# Patient Record
Sex: Male | Born: 1950
Health system: Southern US, Community
[De-identification: ages and names within clinical notes are randomized; demographics above are authoritative.]

## PROBLEM LIST (undated history)

## (undated) DIAGNOSIS — E785 Hyperlipidemia, unspecified: Secondary | ICD-10-CM

## (undated) DIAGNOSIS — I639 Cerebral infarction, unspecified: Secondary | ICD-10-CM

## (undated) DIAGNOSIS — Z789 Other specified health status: Secondary | ICD-10-CM

## (undated) DIAGNOSIS — Z720 Tobacco use: Secondary | ICD-10-CM

## (undated) DIAGNOSIS — I2510 Atherosclerotic heart disease of native coronary artery without angina pectoris: Secondary | ICD-10-CM

## (undated) DIAGNOSIS — I679 Cerebrovascular disease, unspecified: Secondary | ICD-10-CM

## (undated) DIAGNOSIS — R0600 Dyspnea, unspecified: Secondary | ICD-10-CM

## (undated) DIAGNOSIS — I340 Nonrheumatic mitral (valve) insufficiency: Secondary | ICD-10-CM

## (undated) DIAGNOSIS — M1990 Unspecified osteoarthritis, unspecified site: Secondary | ICD-10-CM

## (undated) DIAGNOSIS — I10 Essential (primary) hypertension: Secondary | ICD-10-CM

## (undated) DIAGNOSIS — J449 Chronic obstructive pulmonary disease, unspecified: Secondary | ICD-10-CM

## (undated) DIAGNOSIS — K219 Gastro-esophageal reflux disease without esophagitis: Secondary | ICD-10-CM

## (undated) DIAGNOSIS — C61 Malignant neoplasm of prostate: Secondary | ICD-10-CM

## (undated) DIAGNOSIS — R55 Syncope and collapse: Secondary | ICD-10-CM

## (undated) DIAGNOSIS — C801 Malignant (primary) neoplasm, unspecified: Secondary | ICD-10-CM

## (undated) DIAGNOSIS — J45909 Unspecified asthma, uncomplicated: Secondary | ICD-10-CM

## (undated) HISTORY — DX: Cerebrovascular disease, unspecified: I67.9

## (undated) HISTORY — DX: Syncope and collapse: R55

## (undated) HISTORY — DX: Other specified health status: Z78.9

## (undated) HISTORY — DX: Nonrheumatic mitral (valve) insufficiency: I34.0

## (undated) HISTORY — DX: Malignant neoplasm of prostate: C61

## (undated) HISTORY — DX: Atherosclerotic heart disease of native coronary artery without angina pectoris: I25.10

---

## 1998-08-12 ENCOUNTER — Ambulatory Visit (HOSPITAL_COMMUNITY): Admission: RE | Admit: 1998-08-12 | Discharge: 1998-08-12 | Payer: Self-pay | Admitting: Neurosurgery

## 1998-08-12 ENCOUNTER — Encounter: Payer: Self-pay | Admitting: Neurosurgery

## 1998-08-19 ENCOUNTER — Encounter: Payer: Self-pay | Admitting: Emergency Medicine

## 1998-08-20 ENCOUNTER — Inpatient Hospital Stay (HOSPITAL_COMMUNITY): Admission: EM | Admit: 1998-08-20 | Discharge: 1998-08-22 | Payer: Self-pay | Admitting: Emergency Medicine

## 1998-08-26 ENCOUNTER — Encounter: Payer: Self-pay | Admitting: Emergency Medicine

## 1998-08-27 ENCOUNTER — Inpatient Hospital Stay (HOSPITAL_COMMUNITY): Admission: EM | Admit: 1998-08-27 | Discharge: 1998-08-30 | Payer: Self-pay | Admitting: Emergency Medicine

## 1998-08-28 ENCOUNTER — Encounter: Payer: Self-pay | Admitting: Emergency Medicine

## 1998-09-29 ENCOUNTER — Ambulatory Visit (HOSPITAL_COMMUNITY): Admission: RE | Admit: 1998-09-29 | Discharge: 1998-09-29 | Payer: Self-pay | Admitting: Neurosurgery

## 1998-10-03 ENCOUNTER — Encounter: Payer: Self-pay | Admitting: Neurosurgery

## 1998-10-03 ENCOUNTER — Ambulatory Visit (HOSPITAL_COMMUNITY): Admission: RE | Admit: 1998-10-03 | Discharge: 1998-10-03 | Payer: Self-pay | Admitting: Neurosurgery

## 2000-05-20 ENCOUNTER — Encounter: Payer: Self-pay | Admitting: Emergency Medicine

## 2000-05-21 ENCOUNTER — Inpatient Hospital Stay (HOSPITAL_COMMUNITY): Admission: EM | Admit: 2000-05-21 | Discharge: 2000-05-23 | Payer: Self-pay | Admitting: Emergency Medicine

## 2000-05-22 ENCOUNTER — Encounter: Payer: Self-pay | Admitting: Internal Medicine

## 2000-08-13 ENCOUNTER — Inpatient Hospital Stay (HOSPITAL_COMMUNITY): Admission: EM | Admit: 2000-08-13 | Discharge: 2000-08-15 | Payer: Self-pay | Admitting: Cardiology

## 2002-04-07 ENCOUNTER — Emergency Department (HOSPITAL_COMMUNITY): Admission: EM | Admit: 2002-04-07 | Discharge: 2002-04-07 | Payer: Self-pay | Admitting: Emergency Medicine

## 2002-04-07 ENCOUNTER — Encounter: Payer: Self-pay | Admitting: Emergency Medicine

## 2003-10-05 ENCOUNTER — Emergency Department (HOSPITAL_COMMUNITY): Admission: EM | Admit: 2003-10-05 | Discharge: 2003-10-05 | Payer: Self-pay | Admitting: *Deleted

## 2003-10-19 ENCOUNTER — Ambulatory Visit (HOSPITAL_COMMUNITY): Admission: RE | Admit: 2003-10-19 | Discharge: 2003-10-19 | Payer: Self-pay | Admitting: Pulmonary Disease

## 2004-04-27 ENCOUNTER — Other Ambulatory Visit: Payer: Self-pay

## 2004-04-28 ENCOUNTER — Other Ambulatory Visit: Payer: Self-pay

## 2004-05-18 ENCOUNTER — Ambulatory Visit: Payer: Self-pay | Admitting: Internal Medicine

## 2004-06-26 ENCOUNTER — Emergency Department: Payer: Self-pay | Admitting: Emergency Medicine

## 2004-06-28 ENCOUNTER — Ambulatory Visit: Payer: Self-pay | Admitting: Orthopedic Surgery

## 2004-10-12 ENCOUNTER — Emergency Department: Payer: Self-pay | Admitting: Emergency Medicine

## 2004-10-14 ENCOUNTER — Emergency Department: Payer: Self-pay | Admitting: Emergency Medicine

## 2004-12-13 ENCOUNTER — Ambulatory Visit: Payer: Self-pay | Admitting: Psychiatry

## 2005-05-01 ENCOUNTER — Observation Stay: Payer: Self-pay

## 2005-05-01 ENCOUNTER — Other Ambulatory Visit: Payer: Self-pay

## 2005-05-03 ENCOUNTER — Inpatient Hospital Stay: Payer: Self-pay | Admitting: Internal Medicine

## 2005-10-09 ENCOUNTER — Ambulatory Visit: Payer: Self-pay | Admitting: Physician Assistant

## 2005-11-27 ENCOUNTER — Other Ambulatory Visit: Payer: Self-pay

## 2005-12-04 ENCOUNTER — Ambulatory Visit: Payer: Self-pay | Admitting: Unknown Physician Specialty

## 2006-01-02 ENCOUNTER — Other Ambulatory Visit: Payer: Self-pay

## 2006-01-02 ENCOUNTER — Observation Stay: Payer: Self-pay | Admitting: Internal Medicine

## 2006-08-06 HISTORY — PX: CORONARY ARTERY BYPASS GRAFT: SHX141

## 2006-10-15 ENCOUNTER — Ambulatory Visit: Payer: Self-pay | Admitting: Cardiology

## 2006-10-15 ENCOUNTER — Ambulatory Visit: Admission: RE | Admit: 2006-10-15 | Discharge: 2006-10-16 | Payer: Self-pay | Admitting: Orthopedic Surgery

## 2006-10-16 ENCOUNTER — Inpatient Hospital Stay (HOSPITAL_COMMUNITY): Admission: EM | Admit: 2006-10-16 | Discharge: 2006-10-17 | Payer: Self-pay | Admitting: Emergency Medicine

## 2006-10-28 ENCOUNTER — Ambulatory Visit: Payer: Self-pay | Admitting: Cardiology

## 2006-11-11 ENCOUNTER — Ambulatory Visit: Payer: Self-pay | Admitting: Physician Assistant

## 2006-11-25 ENCOUNTER — Ambulatory Visit: Payer: Self-pay | Admitting: Physician Assistant

## 2007-02-11 ENCOUNTER — Encounter (INDEPENDENT_AMBULATORY_CARE_PROVIDER_SITE_OTHER): Payer: Self-pay | Admitting: Orthopedic Surgery

## 2007-02-11 ENCOUNTER — Inpatient Hospital Stay (HOSPITAL_COMMUNITY): Admission: RE | Admit: 2007-02-11 | Discharge: 2007-02-15 | Payer: Self-pay | Admitting: Orthopedic Surgery

## 2007-08-07 HISTORY — PX: BACK SURGERY: SHX140

## 2008-03-26 ENCOUNTER — Encounter: Admission: RE | Admit: 2008-03-26 | Discharge: 2008-03-26 | Payer: Self-pay | Admitting: Orthopedic Surgery

## 2008-04-15 ENCOUNTER — Inpatient Hospital Stay (HOSPITAL_COMMUNITY): Admission: EM | Admit: 2008-04-15 | Discharge: 2008-04-30 | Payer: Self-pay | Admitting: Cardiology

## 2008-04-15 ENCOUNTER — Ambulatory Visit: Payer: Self-pay | Admitting: Cardiology

## 2008-04-22 ENCOUNTER — Ambulatory Visit: Payer: Self-pay | Admitting: Internal Medicine

## 2008-04-26 ENCOUNTER — Ambulatory Visit: Payer: Self-pay | Admitting: Physical Medicine & Rehabilitation

## 2008-04-27 ENCOUNTER — Encounter: Payer: Self-pay | Admitting: Internal Medicine

## 2010-08-26 ENCOUNTER — Encounter: Payer: Self-pay | Admitting: Internal Medicine

## 2010-12-19 NOTE — H&P (Signed)
NAME:  [name], [name]                    ACCOUNT NO.:  [id number]   MEDICAL RECORD NO.:  [id number]          PATIENT TYPE:  INP   LOCATION:  NA                           FACILITY:  [facility]   PHYSICIAN:  Lianne Cure, P.A.  DATE OF BIRTH:  06-06-1951   DATE OF ADMISSION:  DATE OF DISCHARGE:                              HISTORY & PHYSICAL   PREOPERATIVE DIAGNOSIS:  Postlaminectomy status post L5-S1 disk  herniation.   CHIEF COMPLAINT:  Lumbar pain with radicular leg pain, more so on the  left than the right.  He does have anterior tibialis weakness of a -3/4,  restricted straight leg raise to 30 degrees bilaterally.  He has  difficulty sitting and standing, moves constantly in the examination  room.  He has an MRI from [facility] and it  reports mild central canal narrowing at L4-5 without evidence of nerve  root impingement and postoperative changes to L5-S1, status post  laminectomy and left foramen appears mildly to moderately narrowed with  right foramen opened.  Broad-based disk bulge implants spurring  eccentric to the left at L5-S1.   PAST MEDICAL HISTORY:  Includes hypertension, COPD and coronary artery  disease.   CURRENT MEDICATIONS:  Include Demerol and CPAP at night which he does  not use.   PAST SURGICAL HISTORY:  Includes heart stent placement and back surgery  in 2007 by Dr. Nelda Severe.   FAMILY HISTORY:  Hypertension, his sister.   VITALS:  Today his temperature is 97.2, pulse 68, respirations 18, blood  pressure 146/92.   REVIEW OF SYSTEMS:  He reports no fever.  No chills.  No night sweats.  No recent weight loss or weight gain.  He has no difficulty swallowing.  No chronic cough.  No nausea, vomiting, diarrhea.  No hemoptysis.  No  bowel or bladder incontinence.  No seizures.  No headaches.   PHYSICAL EXAMINATION:  In appearance, he is well developed, well  nourished.  Pupils are equal, round, reactive to light.  NECK:  Full  range of  motion.  No pain.  No adenopathy.  CHEST:  Clear to auscultation.  No wheezes noted today.  HEART:  Regular rate and rhythm.  No murmur.  ABDOMEN:  Positive bowel sounds, soft, nontender to palpation.  EXTREMITIES:  He has weakness anterior tibialis left lower extremity  3/4.  Pain radiating down the bilateral lower extremities and into the  lumbar spine.  He has tenderness to palpation L5-S1.   IMPRESSION:  Recurrent herniated nucleus pulposus L5-S1 status post  laminectomy.   PLAN:  Laminectomy revision with fusion L5-S1 posterior lumbar spine  with iliac crest bone graft by Dr. Nelda Severe.      Lianne Cure, P.A.     MC/MEDQ  D:  02/06/2007  T:  02/06/2007  Job:  [id number]

## 2010-12-19 NOTE — H&P (Signed)
NAMEPRICE, LACHAPELLE                    ACCOUNT NO.:  [id number]   MEDICAL RECORD NO.:  [id number]          PATIENT TYPE:  INP   LOCATION:  2001                         FACILITY:  [facility]   PHYSICIAN:  Vernice Jefferson, MD          DATE OF BIRTH:  Dec 14, 1950   DATE OF ADMISSION:  04/15/2008  DATE OF DISCHARGE:                              HISTORY & PHYSICAL   PRIVATE CARDIOLOGIST:  Learta Codding, MD, [facility].   REASON FOR TRANSFER:  Chest pain.   HISTORY OF PRESENT ILLNESS:  The patient is a 60 year old white male  with history of coronary artery disease.  His last intervention was in  2008 in March with a cutting balloon to a mid LAD, in-stent restenosis,  who presented to hospital in [place] with complaints of repeat chest  pain.  He reports his chest pain has been ongoing basically for the past  week.  He has been chest pain free up until this past week.  Unfortunately, this chest pain comes on with rest and with exertion, but  does get worse with exertional activity.  He reports that he does have  some radiation to his left jaw and left arm, and is similar to his prior  anginal pain.  While in the outside ED, he has had negative biomarkers.  His EKG was unremarkable, but transferred here for further evaluation,  possible left heart catheterization.  Due to his history of medication  noncompliance in the past, the decision was made to use cutting balloon  rather place the drug-eluting stent as definitive therapy last time.   PAST MEDICAL HISTORY:  1. Coronary artery disease as detailed above.  2. Hypertension.   SOCIAL HISTORY:  She is half a pack a day smoker still, no alcohol, no  drug use, lives in [place].   FAMILY HISTORY:  Reviewed, noncontributory to the patient's current  medical condition.   MEDICATIONS:  The patient does not take any.   ALLERGIES:  No known drugs allergies.   REVIEW OF SYSTEMS:  Negative ten-point review systems except for those  dictated in the above  HPI.   PHYSICAL EXAMINATION:  VITAL SIGNS:  Blood pressure is 142/76 on  arrival.  Heart rate of 59, respirations 12, afebrile.  GENERAL:  A well-developed, well-nourished white male, in no acute  distress.  HEENT:  Moist mucous membranes.  No scleral icterus, no conjunctival  pallor.  NECK:  Supple.  Full range of motion.  No jugular venous distention.  CARDIOVASCULAR:  Regular rate and rhythm.  No murmurs, rubs, or gallops.  CHEST:  Clear to auscultation bilaterally.  No wheezes, rales, or  rhonchi.  ABDOMEN:  Soft, nontender, nondistended, normoactive bowel sounds.  EXTREMITIES:  No peripheral edema.  Pulses are 2+ bilaterally.  NEURO:  Nonfocal.   EKG demonstrates normal sinus rhythm, normal ECG.  Chest x-Luc, no acute  infiltrative process.   LABORATORY DATA:  Negative biomarkers.  BUN and creatinine is 16 and  1.1.  Hemoglobin is 60.3 with normal white count.   IMPRESSION:  1. Acute  coronary syndrome, unstable angina  2. Hypertension   PLAN:  I think  it is more likely to be related to in-stent restenosis.  However, I cannot rule out plaque rupture, therefore, we will initiate  heparin, aspirin therapy, keep him n.p.o. for possible invasive risk  stratification in the morning, hold beta-blocker given his relative  bradycardia and initiate statin therapy for now.      Vernice Jefferson, MD  Electronically Signed     JT/MEDQ  D:  04/15/2008  T:  04/15/2008  Job:  [id number]

## 2010-12-19 NOTE — Cardiovascular Report (Signed)
NAME:  [name], [name]                    ACCOUNT NO.:  [id number]   MEDICAL RECORD NO.:  [id number]          PATIENT TYPE:  INP   LOCATION:  6529                         FACILITY:  [facility]   PHYSICIAN:  Bruce R. Juanda Chance, MD, FACCDATE OF BIRTH:  [birth year]   DATE OF PROCEDURE:  04/15/2008  DATE OF DISCHARGE:                            CARDIAC CATHETERIZATION   CLINICAL HISTORY:  Mr. Pirro is a 60 year old and [place]  He has had previous near stent placed in the proximal LAD and  in March 2008, he had cutting balloon angioplasty for in-stent  restenosis.  This was done prior to back surgery.  He has done well  until recently when he developed exertional chest pain over the last  week and this increased and he was admitted to the hospital in [place]  and transferred to [place] for further evaluation.   PROCEDURE:  The procedure was performed by the right femoral artery,  arterial sheath, and 5-French preformed coronary catheters.  A front  wall arterial puncture was performed, and Omnipaque contrast was used.  At completion of the diagnostic study, we made a decision to proceed  with intervention on the lesions in the LAD, which was an in-stent  restenotic lesion in the diagonal branch of the LAD.   The patient was given Angiomax bolus and infusion.  He was given 600 mg  of Plavix.  He had been given 4 chewable aspirin earlier.  We used a Q4  6-French guiding catheter with side holes.  We passed a Prowater wire  down the LAD and across the lesion in the diagonal branch without  difficulty.  We used a 2.0 x 6 mm cutting balloon, performed 3  inflations up 10 atmospheres for 30 seconds.  The balloon was then  removed.  We then approached the lesion in the proximal LAD, which was  within the previously placed stent.  We used a 3.0 x 15 mm cutting  balloon and performed 3 inflations up to 10 atmospheres for 30 seconds.  Final diagnostics were then performed through the guiding  catheter.  The  patient tolerated the procedure well and left the laboratory in  satisfactory condition.   RESULTS:  Left main coronary artery.  The left main coronary artery was  free of significant disease.   Left anterior descending artery.  Please note that the left anterior  descending artery gave rise to a diagonal branch and a septal  perforator.  There was 70% stenosis in the proximal portion of the stent  in the proximal LAD.  There was 90% stenosis in the proximal portion of  the diagonal branch.  This stenosis appears somewhat worsen on the  previous study.   Circumflex artery.  The underlying circumflex artery gave rise to an  atrial branch, a small marginal branch, and a large marginal branch and  a posterolateral branch.  There was 70% stenosis in the large marginal  branch.   Right coronary artery.  Please note that the right coronary artery was a  moderate-sized vessel that gave rise  to a small right ventricle branch,  a large ventricle branch, posterior descending branch, and a small  posterolateral branch.  There were tandem 40% and 30% stenoses in the  proximal right coronary artery.  There was 70% stenosis in the second  marginal branch.   Left ventriculogram.  Please note that the left ventriculogram performed  in the RAO projection showed good wall motion with no areas of  hypokinesis.  Estimated ejection fraction was 60%.   Following cutting balloon angioplasty lesion in diagonal branch of the  LAD, stenosis improved from 90% to 30%.   Following cutting balloon angioplasty of the in-stent restenotic lesion  in the proximal LAD, stenosis improved from 90% to less than 10%.   CONCLUSION:  1. Coronary artery status post prior coronary interventions as      described above with 70% in-stent restenosis in the proximal left      anterior descending, 90% stenosis within the diagonal branch of the      left anterior descending, 70% stenosis in the marginal  branch of      circumflex artery, and 40% proximal stenosis in the right coronary      artery with 70% stenosis in the acute marginal branch and normal      left ventricular function.  2. Successful cutting balloon angioplasty of the lesion in the      diagonal branch of the left anterior descending with improvement in      the percent diameter narrowing from 90% to 30%.  3. Successful cutting balloon angioplasty for the in-stent restenotic      lesion in the proximal left anterior descending with improvement in      the percent diameter narrowing from 70% to less than 10%.   DISPOSITION:  The patient then presents for further observation.  The  patient has not been compliant with therapy.  I think since we did not  put in stents, we could get by with just  aspirin at discharge if  needed.      Bruce Elvera Lennox Juanda Chance, MD, [facility]  Electronically Signed     BRB/MEDQ  D:  04/15/2008  T:  04/16/2008  Job:  [id number]   cc:   Learta Codding, MD,FACC

## 2010-12-19 NOTE — Op Note (Signed)
NAMEOBRIAN, [name]                    ACCOUNT NO.:  [id number]   MEDICAL RECORD NO.:  [id number]          PATIENT TYPE:  INP   LOCATION:  5014                         FACILITY:  [facility]   PHYSICIAN:  Nelda Severe, MD      DATE OF BIRTH:  [birth year]   DATE OF PROCEDURE:  02/11/2007  DATE OF DISCHARGE:                               OPERATIVE REPORT   SURGEON:  Nelda Severe, MD.   ASSISTANT:  Lianne Cure, PA-C.   PREOPERATIVE DIAGNOSIS:  Status post left L5-S1 disk excision with  lumbar spondylosis at L5-S1.   POSTOPERATIVE DIAGNOSIS:  Status post left L5-S1 disk excision with  lumbar spondylosis at L5-S1.   PROCEDURE:  Re-exploration laminectomy and foraminotomy left L5-S1 for  foraminal stenosis; posterolateral fusion at L5-S1 bilaterally;  transforaminal lumbar interbody fusion with cage at L5-S1; insertion  pedicle screws L5-S1 bilaterally; harvest right posterior iliac crest  graft.   OPERATIVE NOTE:  The patient was placed under general endotracheal  anesthesia.  A Foley catheter was placed in the bladder.  Intravenous  antibiotics had been administered for prophylaxis inspection.  Sponge  compression devices were placed on both lower extremities.  The patient  was positioned prone on the [place] frame.  Care was taken to position  the upper extremities so as to avoid hyperflexion and abduction of the  shoulders and so as to avoid hyperflexion of the elbows.  The upper  extremities were padded with foam from axillae to hands.  The thighs,  knees, shins and ankles were supported on pillows.  Hair was clipped  from the back.  A midline incision was marked with a skin marker  including the area of the previous short midline incision and a mark  placed at the proposed site of incision for right posterior iliac crest  graft harvest.   The skin was prepped with DuraPrep and the field draped in a rectangular  fashion, the drape secured with Ioban.  A midline incision was made  including an elliptical excision of the scar from the previous surgery.  Subcutaneous tissue and paraspinal muscles were injected with a mixture  of 0.25% Marcaine plain and 1% lidocaine with epinephrine.  Dissection  was then carried down onto the tips of the spinous processes using  cutting current and the paraspinal muscles mobilized bilaterally from  the L4-5 facette joint distally to the L5-S1 facette joints.  The  laminectomy membrane was identified at the previous L5-S1 laminotomy.  A  cross-table lateral radiograph was taken to confirm the level.   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  This  was done in the usual fashion, identifying the base of the superior  tracheal process, removing a piece of bone, identifying the posterior  pedicle, perforating it with an awl, creating a hole through the  pedicle  into the vertebral body with a pedicle probe and/or a 3.5 mm drill bit,  probing it carefully to make sure it was circumferentially intact,  sounding it and recording the depths, injecting FloSeal into the hole  and then placing a radiopaque marker.   Next we harvested bone graft from the right posterior iliac crest.  A  short incision was made just lateral to the crest and the soft tissue  then retracted medially.  The fascial insertion of the gluteal fascia  and thoracolumbar fascia was then elevated off the posterior surface of  the crest proximal to the posterior superior iliac spine.  A disposable  bone harvesting trephine was then used to remove multiple cores of bone  and then further bone was harvested using a gouge and curette.  This  defect was then irrigated with saline and packed with Gelfoam.  The  fascia was reapposed using one  figure-of-eight #1 Vicryl suture.  I then  placed an 18-gauge spinal needle into the left posterior iliac crest and  aspirated a total of 15 mL of bone marrow from three different areas and  this was mixed with 15 mL of beta tricalcium phosphate as well as the  bone graft which had been harvested from the right-sided crest.   Next, we decorticated the transverse process at L5 and S1 on the right  side.  A satisfactory quantity of the above-mentioned mixture of graft  and beta tricalcium phosphate was then placed between the transverse  processes.  We then placed screws in the S1 and L5 pedicles.  The  lengths were based upon depth measurements and the 7.5 mm diameter screw  was used at S1 and 6.5 at L5.  These screws were then stimulated and the  levels of current to evoke distal EMG stimulation were in satisfactory  safe zone.   A 50-mm precontoured titanium rod was then provisionally attached to the  screws.   I then moved to the left side of the table directed where I prepared the  transverse processes at L5 and S1 by decorticating them.  The screws  were then placed in similar fashion to that described on the right.  The  screws were stimulated and the currents required to cause distal EMG  stimulation were in a safe zone as well.  A working rod approach was  then placed and provisionally coupled on the left side.   Actually prior to placing the screws, we completed a foraminotomy on the  left side at L5-S1 resecting most of the inferior and superior articular  processes and identifying the disks laterally.  This seemed to  decompress the L5 nerve root exiting above the disk quite well.  The  neuroforamen was palpated and was felt to be quite open.   We then placed the working rod as noted above.  We then mobilized the  origins of the left S1 nerve root and retracted it towards the midline.  This allowed [place] to expose some lateral disk, and fenestrate it. A small  disk scraper  was inserted and turned such that it distracted the disk  space.  We then provisionally tightened the working rod to hold the  distraction.  Further curettage of the disk space was carried out and  then the disk space distracted further with an intradiskal distractor.  We further curetted the endplates and enucleated more disk.   I then placed a special funnel in the disk space for insertion of bone  graft and a moderately large quantity of the above-mentioned mixture of  graft and beta tricalcium phosphate was impacted into the front of the  disk space.  Ultimately we chose a 10 mm thick TLIF implant which was  packed with graft and impacted into place without difficulty.   During the entire procedure, once the S1 nerve root was retracted  medially, there was some spontaneous firing noted by the  neurophysiologic technician.  This did not stop when the retraction was  ceased nor was it aggravated particularly by impaction of the implant.   Once the implant had been countersunk, the working rod was removed and a  new precontoured 50-mm titanium rod attached.  All couplings on both  sides were torqued.  A cross-table lateral radiograph showed  satisfactory position of screws.   We injected FloSeal into the disk space, posterior to the TLIF implant.  Prior to placing the rod between the screws on the left side, we did  pack bone graft posterolaterally between transverse processes of L5 and  S1.   There was some bone graft left over.  I further resected a facette joint  on the right side at L5-S1 and packed graft into that area as well.   We then placed a 15-gauge Blake drain subfascially.  This was secured to  the skin using a 2-0 nylon suture in basket weave fashion.  The  thoracolumbar fascia was then closed using continuous #1 Vicryl suture.  A 1/8-inch Hemovac drain was placed in the subcutaneous layer of the  central lumbar wound and brought out through the bone graft harvest  site  wound to the right side and through the skin laterally.  It was secured  with a 2-0 nylon suture as well. A subcuticular closure was then carried  out in both wounds using interrupted 2-0 Vicryl suture.  The skin was  closed using subcuticular 3-0 undyed Vicryl in continuous fashion.  The  skin edges were reinforced with Steri-Strips.  Dressings were antibiotic  ointment with gauze and secured by OpSite.  There was insufficient blood  to spin any Cell Saver blood.  Blood loss estimated at about 250 mL.  There were no intraoperative complications.  Sponge and needle counts  were correct.   The patient returned to the recovery room in satisfactory condition.  He  could move both extremities there, although he seemed to have a  difficult time concentrating enough to really give [place] resisted  contraction in any particular muscle group.      Nelda Severe, MD  Electronically Signed     MT/MEDQ  D:  02/11/2007  T:  02/12/2007  Job:  [id number]

## 2010-12-19 NOTE — H&P (Signed)
[name], [name]                    ACCOUNT NO.:  [id number]   MEDICAL RECORD NO.:  [id number]          PATIENT TYPE:  INP   LOCATION:  4714                         FACILITY:  [facility]   PHYSICIAN:  Rufina Falco, M.D.     DATE OF BIRTH:  [birth year]   DATE OF ADMISSION:  04/15/2008  DATE OF DISCHARGE:                              HISTORY & PHYSICAL   ATTENDING PHYSICIAN:  Casimiro Needle L. Thad Ranger, MD   CHIEF COMPLAINT:  Chest pain.   HISTORY OF PRESENT ILLNESS:  The patient is a 60 year old male with past  medical history significant for CAD status post cath x3 and subsequent  angioplasty x2 to the LAD including a diagonal branch, hypertension,  hyperlipidemia, alcohol abuse, tobacco abuse, and medication  noncompliance, being evaluated by Neurology for confusion and dizziness.  The patient reports he is dizzy when he stands up, when he looks up, or  when he turns his head.  He denies prior similar episodes.  He also  mentions having a right-sided (temporal/parietal) headache for about a  week.  The patient also has some confusion per his daughter's and has  some slowing of his thought process.  The patient denies current chest  pain, cough, shortness of breath, dysuria, frequency, current abdominal  pain, melena, hematochezia, and hematuria, but is endorsed persistent  nausea with dizziness.  The patient is not orthostatic.  He denies  sensation or motor changes.  No other complaints.   PAST MEDICAL HISTORY:  1. CAD, status post cath x3 with ballooning of LAD and diagonal      branch.  2. Hyperlipidemia.  3. Hypertension.  4. Tobacco abuse.  5. Alcohol abuse.  6. GERD.  7. COPD.   PAST SURGICAL HISTORY:  1. Appendectomy.  2. Laminectomy, status post L5-S1 disk herniation and fusion.   FAMILY HISTORY:  Mom has hypertension.  Dad, he does not know.  Daughter  has hypertension.  There is no family history of seizures, stroke, or  neurological malignancy.   SOCIAL HISTORY:  Tobacco,  per the patient, he quit about 6 months ago,  but he has a 50-pack-year history.  Alcohol, he has a history of heavy  alcohol uses, now about a 6-pack per week with lasting drink being 2  weeks ago, and denies illicit drugs, is married.   HOME MEDICATIONS:  None secondary to noncompliance.   HOSPITAL MEDICATIONS:  1. Aspirin 325 mg daily.  2. Lactulose.  3. Tylenol.  4. Zofran.  5. Xanax.  6. Maalox.  7. Nitro p.r.n.  8. Ambien a.c.  Of note, the patient has not had any Ambien or Xanax.   ALLERGIES:  No known drug allergies.   PHYSICAL EXAM:  VITAL SIGNS:  Temperature equals 97.8, pulse equals 68,  respiratory rate equals 20, blood pressure equals 141/77, and O2 sats  equals 98% on room air.  GENERAL:  NAD.  HEENT:  AT, [place], EOMI, positive horizontal nystagmus in both eyes,  PERRLA, MMM.  NECK:  Supple, no carotid bruits, no JVD.  CV:  S1, S2, regular rate and rhythm, no  murmurs, rubs, or gallops.  LUNGS:  CTA bilaterally.  ABDOMEN:  Soft, left upper quadrant and left lower quadrant tenderness  to palpation.  NEUROLOGIC:  Alert and oriented x4, cranial nerves II through XII  grossly intact, motor 5/5 in the upper and lower extremities (question  mild left lower extremity proximal weakness), sensation intact to light  touch, cerebellar abnormal finger-to-nose okay, the patient with  difficulty walking secondary to dizziness, the patient unable to  maintain balance with eyes closed.  Fine motor and bilateral hands are  also slowed.  DTRs normal.  EXTREMITIES:  No edema, no cyanosis, clubbing in the hands.  SKIN:  No jaundice.  GU:  No CVA tenderness.   LABS:  Cholesterol 177, triglyceride 63, HDL equals 32, LDL equals 782,  cardiac enzymes negative x3, magnesium equals 2.4, PT equals 13.7, INR  equals 1.0, hemoglobin A1c equals 5.3, lipase equals 22, amylase equals  68, LFTs normal except albumin which is decreased in total protein,  mildly decreased at 5.8.  WBC equals 8.5,  hemoglobin equals 15.0,  platelets 133.  Sodium 139, potassium 4.8, chloride 109, bicarb 25, BUN  14, creatinine 0.88, glucose 141, anion gap of 5, calcium equals 8.9.  CT of the head with and without contrast no significant intracranial  abnormality.  CT of the abdomen and pelvis were negative for acute  pathology.  Abdominal ultrasound was also negative.   ASSESSMENT AND PLAN:  Confusion/dizziness.  Etiology unclear, but  differential diagnosis includes cerebrovascular accident,  vertebrobasilar insufficiency, Wernicke-Korsakoff syndrome, a cold  infection, vasculitis, etc.  The patient has had laminectomy and has  hardware which make sure it is MR family, but I assume it is.  The  patient does need MRI, MRA of the head and neck if possible.  Would get  this MRA of the neck with contrast.  Would also check a UA, urine C and  S given the patient's abdominal pain and flank pain during this  hospitalization, although the patient not complaining of dysuria at this  time.  We would also start thiamine and folic acid.  We will check TSH,  B12, RPR, and consent the patient for human immunodeficiency virus if  not already done.  We would also check sed rate.  The patient with  significant cerebellar dysfunction secondary to dizziness.  Question if  this is  from alcohol history or other etiologies such as cerebrovascular  accident.  We would discuss this interesting case with Dr. Thad Ranger.  We  will also consider full stroke workup depending on findings of the MRI,  MRA.  I appreciate this interesting consult.  We would also get PT, OT  consult if not already done so.      Rufina Falco, M.D.  Electronically Signed     JY/MEDQ  D:  04/20/2008  T:  04/21/2008  Job:  [id number]

## 2010-12-19 NOTE — Discharge Summary (Signed)
NAMELEODAN, BOLYARD                    ACCOUNT NO.:  [id number]   MEDICAL RECORD NO.:  [id number]          PATIENT TYPE:  INP   LOCATION:  4714                         FACILITY:  [facility]   PHYSICIAN:  Verne Carrow, MDDATE OF BIRTH:  Jan 25, 1951   DATE OF ADMISSION:  04/15/2008  DATE OF DISCHARGE:  04/30/2008                               DISCHARGE SUMMARY   PRIMARY CARDIOLOGIST:  Jonelle Sidle, MD   PRIMARY CARE Avonda Toso:  Dr.Tate in [place], [place].   DISCHARGE DIAGNOSIS:  Unstable angina.   SECONDARY DIAGNOSES:  1. Coronary artery disease, status post prior stenting of the left      anterior descending with percutaneous transluminal coronary      angioplasty of the proximal left anterior descending secondary to      in-stent restenosis on this admission.  2. Hypertension.  3. Hyperlipidemia.  4. Ongoing tobacco abuse.  5. EtOH abuse.  6. Ongoing dizziness with unclear etiology.  7. Gastroesophageal reflux disease.  8. Chronic obstructive pulmonary disease.  9. Diverticulosis by colonoscopy this admission.  10.Gastritis and duodenitis by esophagogastroduodenoscopy this      admission.   ALLERGIES:  No known drug allergies.   PROCEDURES:  1. Left heart cardiac catheterization with successful percutaneous      transluminal coronary angioplasty of the proximal left anterior      descending secondary to in-stent restenosis.  2. Esophagogastroduodenoscopy performed on April 22, 2008, showing      distal esophageal stricture dilated to 18 mm, 3-cm hiatal hernia,      gastritis, and duodenitis.  3. Colonoscopy performed on April 27, 2008, showing diverticulosis      that was felt to be moderate.  There were no polyps.   RADIOLOGY:  1. CT of the abdomen and pelvis showing no acute findings.  Head CT      showing no significant intracranial abnormality.  2. MRI/MRA of the brain showing no acute infarct with a question of      2.7-mm anterior communicating artery  aneurysm.  3. MRA of the neck showed atherosclerotic type changes postpartum and      the right carotid bifurcation without evidence of hemodynamically      significant stenoses.  4. Repeat MRI of the brain on April 26, 2008, showed no change      from prior study and no acute abnormality, lumbar puncture with      normal results.   HISTORY OF PRESENT ILLNESS:  A 60 year old male with prior history of  CAD, status post stenting of the LAD, who was in his usual state of  health until approximately 1 week prior to admission when he began to  experience rest and exertional substernal chest discomfort with  radiation to the jaw and left arm similar to previous angina.  He was  admitted to [facility] on April 15, 2008, for further evaluation.   HOSPITAL COURSE:  The patient underwent left heart cardiac  catheterization on April 15, 2008, revealing 70% in-stent restenosis  within the previously placed proximal LAD stent.  This was successfully  balloon  angioplastied.  The patient also had a 90% stenosis in the first  diagonal, which was also successfully balloon angioplastied with result  of approximately 30%.  EF was 60%, and the patient otherwise had  nonobstructive disease.  Postprocedure, he did complain of some right  upper quadrant and mild lower sternal discomfort.  ECG remained normal,  and enzymes remained negative.  A right upper quadrant ultrasound was  performed and was normal.  CT of the abdomen and pelvis was also  performed without any acute findings.  The patient reported dizziness  and for that reason remained in the hospital.  A CT of his head was  performed on April 20, 2008, and this showed no acute intracranial  abnormalities.  He had remained very dizzy with shifting of his eyes,  turning of his head, and changing position, and for that reason,  Neurology was consulted.  An MRI/MRA of brain was performed with results  as above, but in general had no  significant findings.  Neurology  questioned whether or not he could have vestibulitis  versus  cerebellitis versus Wernicke's.  The patient was maintained on DT  prophylaxis and thiamine and folic acid.  He was eventually placed on  meclizine therapy as well as Valium; however, this has made little  impact in his dizziness.  He is extensively evaluated by Physical  Therapy, Occupational Therapy, and Speech Therapy.  They have  recommended skilled nursing facility versus home with outpatient PT, OT,  and Speech.  He has been seen by our inpatient rehab team and was felt  not to be a candidate for inpatient rehab.  The patient wishes to avoid  skilled nursing facility placement.  For these reasons, he will be  discharged today with arrangements for outpatient PT, OT, and Speech at  [facility] in [place], [place].   With regards to the abdominal discomfort that Mr. Danielski experienced  during this admission, as noted above, he had a normal gallbladder  ultrasound as well as abdominal and pelvic CT.  [facility]  was consulted, and the patient underwent an EGD on April 22, 2008,  showing distal esophageal stricture that was subsequently dilated.  He  also had gastritis and duodenitis, and it was recommended that he would  be maintained on proton pump inhibitor therapy.  Colonoscopy was also  performed showing moderate diverticulosis.  Gastroenterology have  arranged for outpatient followup on June 01, 2008.  As the patient  lives in the [place], [place] area, he will likely obtain a GI followup  there.   DISCHARGE LABORATORIES:  Hemoglobin 14.0, hematocrit 40.7, WBC 8.2, and  platelets 221.  Sodium 138, potassium 4.1, chloride 106, CO2 of 28, BUN  10, creatinine 1.07, glucose 105, and calcium 8.9.  Total bilirubin 0.7,  alkaline phosphatase 81, AST 70, ALT 15, total protein 5.8, albumin 3.1,  magnesium 2.4, ammonia 13, amylase 68, hemoglobin A1c 5.3, and  lipase  24.  Cardiac markers are negative x4.  Total cholesterol 177,  triglycerides 63, HDL 32, and LDL 132.  TSH 3.124.  Vitamin B12 of 388.  Methylmalonic acid 221.  Lumbar puncture was normal.  RPR was negative.  H. pylori screen was negative.   DISPOSITION:  The patient will be discharged home today in good  condition.   FOLLOWUP PLANS AND APPOINTMENTS:  We have arranged for followup with Dr.  Nona Dell in our [place] office on May 20, 2008, at 10 a.m.  He  will follow up  with Dr. Stan Head at [facility] in [place] on  June 01, 2008, at 10:30 a.m.  We have asked to follow with Dr. Delories Heinz  up in [place].  He will begin speech, physical, occupational therapy at  [facility] at 12:45 p.m.   DISCHARGE MEDICATIONS:  1. Aspirin 325 mg daily.  2. Nitroglycerin 0.4 mg sublingual p.r.n. chest pain.  3. Protonix 40 mg daily.  4. Thiamine 100 mg daily.  5. Folic acid 1 mg daily.  6. Meclizine 25 mg t.i.d.   We discontinued statin therapy secondary to the patient's abdominal  pain.  This could be re-initiated as an outpatient.  The patient did not  require Plavix in the setting of PTCA only.  We discontinued beta-  blocker secondary to baseline bradycardia.   OUTSTANDING LABORATORY STUDIES:  None.   DURATION/DISCHARGE ENCOUNTER:  90 minutes including physician time.       Nicolasa Ducking, ANP      Verne Carrow, MD  Electronically Signed    CB/MEDQ  D:  04/30/2008  T:  05/01/2008  Job:  [id number]   cc:   Iva Boop, MD,FACG  Robyne Peers, MD

## 2010-12-19 NOTE — Procedures (Signed)
EEG NUMBER:  [id number]   REF FERRING PHYSICIAN:  Luis Abed, MD, [facility]   This 60 year old man is admitted for chest pain, had a lumbar puncture,  has dizziness, confusion, right-sided temporal parietal headache,  history of coronary artery disease, had a cath with ballooning of LAD,  and diagonal branch, has hyperlipidemia, hypertension, tobacco and  alcohol abuse, GERD, and COPD.  It is not clear what the indication for  the EEG was except perhaps the dizziness and confusion.   This is a portable 17-channel EEG with one channel devoted to EKG  utilizing [employer] 10/20 lead placement system.   MEDICATIONS:  The patient's medications listed include Zocor, vitamin  B1, folic acid, Colace, Protonix, MiraLax, Antivert, Valium, aspirin,  Versed, fentanyl, nitroglycerin, GI cocktail, Tylenol, Xanax, milk of  magnesia, Ambien, Zofran, morphine and Ativan.  He is described as awake  and drowsy, clinically although electrographically appeared to be awake  the whole time.  Hyperventilation was not performed.  Photic stimulation  was performed.  Background consists of well-organized, well-developed,  well-modulated 9-10 Hz alpha activity which is predominant at the  posterior head regions and reactive to eye-opening.  No interhemispheric  asymmetry is identified and no definite epileptiform discharges are  seen.  Photic stimulation did not produce any significant change in the  background activity and the EKG monitor reveals relatively regular  rhythm with a rate of 78 beats per minute.   CONCLUSION:  Normal EEG in the waking state without seizure activity or  focal abnormality seen during the course today's recording.  Clinical  correlation is recommended.      Catherine A. Orlin Hilding, M.D.  Electronically Signed     NUU:VOZD  D:  04/28/2008 19:16:48  T:  04/29/2008 03:52:32  Job #:  [id number]

## 2010-12-22 NOTE — H&P (Signed)
[name], [name]                    ACCOUNT NO.:  [id number]   MEDICAL RECORD NO.:  [id number]          PATIENT TYPE:  INP   LOCATION:  NA                           FACILITY:  [facility]   PHYSICIAN:  Nelda Severe, MD      DATE OF BIRTH:  Jul 18, 1951   DATE OF ADMISSION:  DATE OF DISCHARGE:                              HISTORY & PHYSICAL   DIAGNOSIS:  L5-S1 lumbar recurrent disc herniation with stenosis.   He has left leg pain to his foot, right leg pain to the knee and lumbar  pain.   HE HAS NO KNOWN DRUG ALLERGIES.   CURRENT MEDICATIONS:  Includes:  1. Mepergan fortis 1 tablet every 6 hours p.r.n. for pain.  2. Lyrcia 150 twice daily.   SOCIAL HISTORY:  He is a smoker.  He smokes approximately 6 cigarettes a  day, he is trying to quit.  He has a positive alcohol use and states it  is social.   FAMILY HISTORY:  Includes hypertension and arthritis.   REVIEW OF SYSTEMS:  He reports no changing of vision.  No changes in  hearing.  No hoarseness.  No chronic cough.  No shortness of breath.  No  fevers.  No chills.  No chest pain.  No bowel or bladder incontinence.  No blackouts.  No seizures.  No hemoptysis.   PHYSICAL EXAMINATION:  VITAL SIGNS:  Today, he has a temperature of  97.7.  Pulse rate of 72.  Respirations 18.  His blood pressure is  128/84.  HEENT:  He is normocephalic in appearance.  Pupils are equal, round and  reactive to light.  NECK:  Has full range of motion, nontender to palpation.  CHEST:  Clear to auscultation bilaterally.  No wheezing noted.  HEART:  Regular rate and rhythm.  No murmurs noted.  ABDOMEN:  Soft.  Positive bowel sounds auscultated.  He is nontender to  palpation.  EXTREMITY EXAM:  Left lower extremity, he has radicular pain from the  lumbar area, across the buttocks to his foot.  Right lower extremity has  radicular pain down the posterior thigh to his knee.  Distally,  sensation is grossly intact and equal bilaterally L4-S1.  Reflexes of  the  ankles and knees are intact and equal bilaterally.  He does have an  antalgic gait.   X-rays were reviewed.  Show recurrent herniated nucleus pulposa.  On  MRI, degenerative disc disease, status post disc excision.   Diagnosis L5-S1 recurrent disc protrusion   PLAN:  L5-S1 laminectomy fusion with iliac bone graft by Dr. Nelda Severe.      Lianne Cure, P.A.      Nelda Severe, MD  Electronically Signed    MC/MEDQ  D:  10/11/2006  T:  10/12/2006  Job:  [id number]

## 2010-12-22 NOTE — Assessment & Plan Note (Signed)
[facility]                          [facility] OFFICE NOTE   NAME:[name], [name]                           MRN:          [id number]  DATE:11/25/2006                            DOB:          Sep 08, 1950    PRIMARY CARDIOLOGIST:  Dr. Andee Lineman.   PRIMARY CARE PHYSICIAN:  He will be established with someone at [facility] December 04, 2006.   HISTORY OF PRESENT ILLNESS:  [name] is a 60 year old male patient with  a history of coronary disease status post bare metal stenting to the LAD  in 2000, who recently presented to the [facility] with unstable  angina pectoris and underwent cutting balloon angioplasty to the  proximal LAD. I have been following him closely over the last few weeks  to ensure that he is ready for surgery. He was last seen on November 11, 2006. He had multiple other complaints and we set him up with some lab  tests and also for follow up with his primary care physician. He did  have an elevated glucose at 141. This can be repeated with his new  primary care physician. He also had a mildly elevated alkaline  phosphatase at 109. This can also be followed with his new primary care  physician. Of note he did have a CBC that was normal. He comes back  today for follow up. He discontinued his Plavix a couple of days ago. He  denies any recurrent chest pain or shortness of breath. Denies any  exertional chest pressure or heaviness. Denies any syncope or near  syncope. He does however note some left lower quadrant pain. This  started a couple of days ago. He had a little bit of diarrhea yesterday.  Denies fevers or chills. He does feel somewhat bloated.   CURRENT MEDICATIONS:  1. Aspirin 325 mg daily.  2. Lipitor 80 mg at bedtime.  3. Toprol XL 50 mg daily.  4. Omeprazole 20 mg daily- he has not yet started this.   ALLERGIES:  No known drug allergies.   PHYSICAL EXAMINATION:  He is a well-nourished, well-developed male in no  distress, blood pressure is 155/92, pulse 70, weight 216.8 pounds.  HEENT: Unremarkable.  NECK: No JVD.  CARDIAC: S1, S2, regular rate and rhythm.  LUNGS: Clear to auscultation bilaterally without wheezing, rhonchi, or  rales.  ABDOMEN: Normoactive bowel sounds, no organomegaly. He is quite point  tender in the left lower quadrant to palpation with voluntary guarding.  EXTREMITIES: Without edema.  NEUROLOGIC: He is alert and oriented x3. Cranial nerves II-XII grossly  intact.   Electrocardiogram reveals sinus rhythm, heart rate is 76, normal axis.  No acute changes.   IMPRESSION:  1. Coronary artery disease.      a.     Status post bare metal stenting to the LAD in 2000.      b.     Status post cutting balloon angioplasty of the proximal LAD       secondary to unstable angina pectoris October 17, 2006.  2. Good left ventricular function with  and ejection fraction of 55% by      recent cardiac catheterization.  3. Hypertension.  4. Treated dyslipidemia.  5. Degenerative disc disease.      a.     Awaiting back surgery.  6. Chronic obstructive pulmonary disease.  7. Left lower quadrant abdominal pain.      a.     rule out diverticulitis  8. Sleep apnea.   PLAN:  The patient presents to the office today for follow up to receive  further recommendations regarding proceeding with surgery.  From a  cardiac perspective, he is not having any anginal symptoms. Overall he  is doing well. Dr. Juanda Chance at the time of catheterization noted that  surgery could be targeted for 6 weeks after intervention. He is off of  his Plavix now. I did suggest that we go up on his Toprol from 50 mg  to  100 mg a day and continue his beta blocker therapy throughout the  perioperative period. He will also need to remain on aspirin throughout  the perioperative period. He requires no further cardiac workup prior to  his non cardiac surgery and at this point in time should be at  acceptable risk.   He does  however have other medical issues ongoing. We will try to get  him in for sooner follow up with his primary care Marshae Azam who will  initially see him on April the 30th. I discussed the case further with  Dr. Andee Lineman today. We will set the patient up for an abdominal and pelvic  CT scan. We will use oral and IV contrast. We will rule out  retroperitoneal hematoma as the cause for his pain. We will also need to  rule out diverticulitis as a cause for his pain. As noted above we will  try to get him in to be seen sooner with his primary care Nyzir Dubois.  Of  note, if it is going to be somewhat difficult to get the patient in to  see his primary care Thai Burgueno sooner and he has evidence of  diverticulitis on his CT scan, we can always initiate Cipro 500 mg twice  a day for a week until he can be seen by his primary care Cass Vandermeulen. The  patient knows to go to the emergency room should he develop any  significant fevers or chills, hematochezia, nausea, vomiting, or  worsening diarrhea.   11/26/06 Addendum:  The CT returned showing uncomplicated diverticulitis.  The patient will be started on Cipro 500mg  BID for 7 days and we will  try to get him into his primary MD sooner for follow-up.      Tereso Newcomer, PA-C  Electronically Signed      Learta Codding, MD,FACC  Electronically Signed   SW/MedQ  DD: 11/25/2006  DT: 11/25/2006  Job #: [id number]   cc:   Nelda Severe, MD

## 2010-12-22 NOTE — Assessment & Plan Note (Signed)
[facility]                          [facility] NOTE   NAME:[name], [name]                           MRN:          [id number]  DATE:10/28/2006                            DOB:          1951-01-16    PRIMARY CARE PHYSICIAN:  None.   PRIMARY CARDIOLOGIST:  He is new to Dr. Lewayne Bunting.   HISTORY OF PRESENT ILLNESS:  Mr. Cuttino is a very pleasant 60 year old  male patient with a prior history of CAD status post bare metal stenting  to the proximal LAD in 2000, who was recently seen in [facility] for  possible back surgery.  He is from [place], about 70 miles away from  [place].  He was actually staying in the [place] area for his upcoming  surgery.  He developed symptoms that were consistent with unstable  angina pectoris.  He was brought to [facility].  He ruled out  for myocardial infarction but had cardiac catheterization that revealed  95% in-stent restenosis in the proximal LAD.  This was treated with  cutting balloon angioplasty.  Dr. Juanda Chance noted that he chose cutting  balloon, rather than a medicated stent, because of concerns of  compliance and because of probable need for early back surgery.  He  recommended Plavix for one month and probable target surgery some time  after 6 weeks.  He returns to the office today for followup.  He is  doing well without chest pain or shortness of breath.  He notes overall  he is doing well.  He continues to have back pain and is awaiting his  upcoming surgery.  He denies any problems with his right femoral artery  site.   CURRENT MEDICATIONS:  1. Aspirin 325 mg daily.  2. Plavix 75 mg daily.  3. Lipitor 80 mg q.h.s.  4. Toprol XL 25 mg daily.   ALLERGIES:  NO KNOWN DRUG ALLERGIES.   PHYSICAL EXAMINATION:  GENERAL:  He is a well-nourished, well-developed,  male in no distress.  VITAL SIGNS:  Blood pressure 156/100, repeat blood pressure by me is  148/89, weight 216 pounds, pulse 74.  HEENT:  Unremarkable.  NECK:  Without JVD.  CARDIOVASCULAR:  Normal S1, S2, regular rate and rhythm without murmurs.  LUNGS:  Clear to auscultation bilaterally without wheezes, rhonchi or  rales.  ABDOMEN:  Soft, nontender with normoactive bowel sounds, no  organomegaly.  EXTREMITIES: Without edema.  Calves are soft, nontender.  SKIN:  Warm and dry.  NEUROLOGIC:  He is alert and oriented x3.  Cranial nerves 2-12 grossly  intact.   Right femoral arteriotomy site is without hematoma or bruits.   Electrocardiogram reveals a sinus rhythm and a heart rate of 70, normal  axis, no acute changes.   IMPRESSION:  1. Coronary artery disease.      a.     History of bare metal stenting to LAD in 2000.      b.     Recent history of unstable angina pectoris, treated with       cutting balloon angioplasty to the proximal LAD  stent, secondary       to in-stent restenosis.      c.     Residual CAD:  Proximal circumflex 30%, proximal to mid-RCA       30%.  2. Preserved LV function with an EF of 55% by recent cardiac      catheterization.  3. Hypertension, uncontrolled.  4. Treated dyslipidemia.  5. Degenerative disk disease.      a.     Awaiting back surgery.  6. Chronic obstructive pulmonary disease.  7. Significant tobacco abuse with 100 pack year history.  8. Elevated alcohol use.   PLAN:  The patient presents to the office today for post-catheterization  and intervention followup.  He is doing well.  He does need better  control of his blood pressure.  He is cutting back on his smoking and is  committed to quitting.  I have congratulated him on this and encouraged  him to continue the good work.  I have also asked him to cut back on his  alcohol use, as this is impacting his blood pressure.  He has not taken  his medications yet today.  We will go ahead and up titrate his Toprol  XL to 50 mg a day.  We will bring him back in a couple of weeks to  recheck his blood pressure.  I would like  to get that down to a normal  level, before he proceeds with surgery.  Will also make sure he has  lipids and LFTs set up in the next 8 weeks.  We will try to set him up  with a primary care doctor, either close to [facility] or close to his home,  if that is possible.  As noted by Dr. Juanda Chance, he should be okay for his  surgery 6 weeks post-intervention.  That would be some time on or after  April 28th.  I have explained this to the patient.  He will complete one-  month dose of Plavix post-intervention.      Tereso Newcomer, PA-C  Electronically Signed      Learta Codding, MD,FACC  Electronically Signed   SW/MedQ  DD: 10/28/2006  DT: 10/28/2006  Job #: [id number]   cc:   Nelda Severe, MD

## 2010-12-22 NOTE — Consult Note (Signed)
NAMEPRATYUSH, [name]                                ACCOUNT NO.:  [id number]   MEDICAL RECORD NO.:  [id number]                   PATIENT TYPE:  EMS   LOCATION:  ED                                   FACILITY:  [facility]   PHYSICIAN:  Deanna Artis. Sharene Skeans, M.D.           DATE OF BIRTH:  July 27, 1951   DATE OF CONSULTATION:  04/07/2002  DATE OF DISCHARGE:                                   CONSULTATION   CHIEF COMPLAINT:  Headaches.   HISTORY OF THE PRESENT CONDITION:  The patient is a 61 year old right-handed  married gentleman who had sudden onset of left temporal headache of marked  severity associated with nausea, diaphoresis, and then three to four  episodes of vomiting.  He complained of tingling of his left hand and says  that it still feels tingling.  He also felt severe pain in his head as if  his hair was being pulled.   The patient says that he has both pounding and steady qualities to his  headaches.  They tend to be just left-sided in nature.  He does have  sensitivity to light and sound often with his headaches.   Headaches began about three to four weeks ago.  About two weeks ago the  patient had onset of confusional state where he seemed to be uncertain of  his name and where he was and what was about him.  I am not certain whether  he truly had an amnestic episode or had dysphagia or just delirium.   His symptoms cleared within about two hours.  He was seen in the [place]  [facility] and sent to his family physician.  The family physician  ordered an MRI scan of the brain which was normal.   The patient also had a carotid Doppler which showed a 70-80% stenosis.  This  was carried out approximately a month ago.  The patient has had known  hypertension, but was recently taken off of antihypertensive medication and  was not hypertensive today.  He has known dyslipidemia.  He has not had  problems with atherosclerotic cardiovascular disease or diabetes mellitus.   The  patient says that he had a stroke a few years ago.  This involved the  left side of his body and there may be slight residua.  He said no definite  stroke was ever found.  He was hospitalized in [place] for this.   The patient denies closed head injury.  He had a myocardial infarction in  2000 and had angiogram in the stent.   REVIEW OF SYMPTOMS:  The patient has not had fever, runny nose, cough, chest  pain, shortness of breath, abdominal pain, diarrhea, dysuria, skin rash.  Twelve system review is otherwise negative.   CURRENT MEDICATIONS:  1. Aspirin 325 mg q.d.  2. The patient should be on antilipid medicines and cannot afford them.   ALLERGIES:  None known.   FAMILY HISTORY:  The patient has a brother with migraines.  His mother had a  stroke.  His father died of a motor vehicle accident.  As best I know there  is no other family history of hypercholesterolemia, diabetes, cancer.   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  He has a ninth grade education.  He smokes a pack of cigarettes per day.  He  probably averages between a pack and a half and two packs of cigarettes per  day since age 56 (he is now 54).  He drinks a six-pack of beer occasionally  on the weekends.  He does not drink on a daily basis.  He has no known  allergies to medicines.  He has never had a closed head injury or been  hospitalized for injury to his brain.   The patient was seen today by Dr. Lora Havens.  Head CT scan basically was  normal, but showed two frontal sebaceous cysts within the skin.  The patient  had a lumbar puncture which showed an opening pressure of 290, closing  pressure of 240, 10 cc of clear colorless fluid was obtained.  The patient  had one white blood cell and 20 some odd red blood cells in tube two and 7  white blood cells and 59 red cells in tube four.  Glucose was 68, protein  33.  These were normal.  I personally reviewed the CT scan data  and  personally reviewed the lumbar puncture data.   The patient has received treatment for his headaches and headache is  substantially resolved.   I was asked by Dr. Beverely Pace to see the patient because of his complicated  history and the presence of apparent increased intracranial pressure on  lumbar puncture.   PHYSICAL EXAMINATION:  GENERAL:  This is a well-developed, well-nourished,  pleasant gentleman in no distress.  He has mild dysarthria.  VITAL SIGNS:  Blood pressure 124/74, resting pulse 54, oxygen saturation  100%.  HEENT:  No signs of infection.  NECK:  Supple.  Full range of motion.  No localized tenderness in the  cranial or cervical junction, temples, temporomandibular joints, orbits,  sinuses, oropharynx, or anterior cervical region.  I did not hear a right  cranial bruit, though I expected to.  LUNGS:  Clear to auscultation.  HEART:  No murmurs.  Pulses normal.  ABDOMEN:  Soft, nontender.  Bowel sounds normal.  EXTREMITIES:  Well formed without edema, cyanosis, alterations in tone or  tight heel cords.  NEUROLOGIC:  Mental status:  The patient was awake, alert, attentive,  appropriate.  He has a little problem with forgetfulness.  His wife had to  correct him on occasion.  He is able to name objects, repeat phrases, and  follow commands.  Cranial:  He has fair fund of knowledge.  This is  commensurate with his educational level.   Cranial nerves:  Round, reactive pupils.  Normal fundi.  Left pupil does not  seem to react as well as the right.  Fundi show sharp disk margins.  Normal  venous pulsations.  Visual fields are full to double simultaneous stimuli.  Extraocular movements are full and conjugate.  Okay end responses equal  bilaterally.  Symmetric facial strength.  Midline tongue and uvula.  Air  conduction greater than bone conduction bilaterally.  Sensation seems to  show increased cold on the right face and left body.  Vibratory sense  was equal.   Proprioception was equal.  Stereoagnosis was equal.  Cerebellar  examination:  Good finger-to-nose, rapid repetitive movements.  Gait:  Slightly broad based.  He was not truly unsteady.  Deep tendon reflexes were  symmetric and virtually absent.  He had bilateral flexor plantar responses.   IMPRESSION:  1. Headache disorder, suspect migraine without aura (346.10).  2. Evidence of increased intracranial pressure without papilledema (I think     that is 277.00).   PLAN:  We need to obtain the MRI scan from [facility].  We also need to obtain the carotid Doppler studies.  The patient needs to  take aspirin.  We need to figure out if there is some way that we can get  him on Statin medications if indeed he has hypercholesterolemia.  The  patient may very well have had a prior brain stem stroke judging from the  __________ sensation and the clumsy left hand.  This all appears to be old.   I appreciate the opportunity to see him.  Will plan to see him back in  follow-up at my office after I can collect the information that is necessary  to complete his work-up.  For the time being, I would not treat his apparent  increased  intracranial pressure because he is not showing evidence of papilledema.  We  may need to get him seen by an eye doctor in order to confirm this but his  eye ground seemed pretty normal to me.  I appreciate the opportunity to  participate in his care.                                               Deanna Artis. Sharene Skeans, M.D.    [facility]  D:  04/07/2002  T:  04/08/2002  Job:  [id number]

## 2010-12-22 NOTE — H&P (Signed)
NAMELANIER, MILLON                    ACCOUNT NO.:  [id number]   MEDICAL RECORD NO.:  [id number]          PATIENT TYPE:  INP   LOCATION:  6526                         FACILITY:  [facility]   PHYSICIAN:  Vernice Jefferson, MD          DATE OF BIRTH:  07/26/1951   DATE OF ADMISSION:  10/15/2006  DATE OF DISCHARGE:  10/17/2006                              HISTORY & PHYSICAL   CHIEF COMPLAINT:  Chest pain.   HISTORY OF PRESENT ILLNESS:  A 60 year old white male with a history of  coronary disease, last intervened on with an LAD stent in 2000 and most  recent cath in 2002 without any obstructive coronary artery disease with  a recurrence of chest pain that started on Saturday, the patient states  that this chest pain is substernal in nature, associated with some  shortness of breath, diaphoresis and some nausea occasionally.  The  patient also states that this pain is worse with exertion.  The patient  states that this pain has not occurred since his last heart  catheterization in 2002.  The patient also reports that he has not seen  in a doctor since this visit and has not been on any medications  whatsoever.   PAST MEDICAL HISTORY:  1. Coronary artery disease status post LAD stent in 2000.  2. Hypertension.   SOCIAL HISTORY:  The patient lives in [place], 30 pack year history of  smoking, smoking 1/2 pack a day now.  No alcohol use.  No drug use.   FAMILY HISTORY:  Negative for any early coronary disease, no early  sudden cardiac death in the family.   MEDICATIONS:  None.   ALLERGIES:  None.   REVIEW OF SYSTEMS:  Negative 11 point review of systems except for  otherwise dictated in above HPI.   PHYSICAL EXAMINATION:  VITAL SIGNS:  Blood pressure 114/72, heart rate  54, respirations 12, the patient is afebrile, sating 98% on room air.  GENERAL:  Well-developed, well-nourished obese white male no acute  distress.  HEENT:  Moist mucous membranes, no scleral icterus, no conjunctival   pallor.  NECK:  Supple, full range of motion.  No jugular venous distention.  CARDIOVASCULAR:  Regular rate and rhythm, no rubs, murmurs or gallops.  CHEST:  Clear to auscultation bilaterally, no wheezes, rales or rhonchi.  ABDOMEN:  Soft, nontender, nondistended but obese with normal active  bowel sounds.  EXTREMITIES:  No peripheral edema.  NEURO:  Nonfocal.   LABORATORY DATA:  EKG normal sinus rhythm, normal ECG.  Chest x-Raj is  not performed.  Laboratory data with negative cardiac biomarkers,  creatinine of 1.1, hemoglobin of 15.8.   ASSESSMENT/PLAN:  1. Acute coronary syndrome, unstable angina.  2. Coronary artery disease.  3. Tobacco abuse.   PLAN:  1. Will admit the patient to telemetry and rule out by serial cardiac      biomarkers.  2. Will initiate anticoagulation therapy with heparin and aspirin,      titrated to INR goals of 70-90, will hold Plavix up stream right  now.  3. Will give beta blocker and Statin with hold parameters.  4. Will keep NPO for noninvasive versus possible left heart cath in      the morning.      Vernice Jefferson, MD  Electronically Signed     JT/MEDQ  D:  10/16/2006  T:  10/17/2006  Job:  [id number]

## 2010-12-22 NOTE — Discharge Summary (Signed)
[facility]  Patient:    [name], [name]                             MRN: [id number] Adm. Date:  [id number] Disc. Date: 08/15/00 Attending:  Mirian Mo Dictator:   Abelino Derrick, P.A.C. [facility] CC:         Dr. Loma Sender, [address], [place], [place] [address]   Referring Physician Discharge [facility]  DISCHARGE DIAGNOSES: 1. Coronary disease, previous left anterior descending artery stent January    2000, negative Cardiolite October 2001, patent left anterior descending    artery by catheterization this admission. 2. Noncompliance. 3. Smoking. 4. Hyperlipidemia. 5. Hypertension.  HOSPITAL COURSE:  Patient is a 60 year old male transferred from [place] on August 13, 2000.  He apparently had seen [facility] in the past. He presented to [place] with chest pain similar to his previous MI symptoms. He was transferred to [facility] for further evaluation.  He was put on Lovenox and nitrates.  Enzymes were negative.  Catheterization was done August 14, 2000 by Dr. Juanda Chance which revealed 30% RCA, normal circumflex, patent LAD stent, 50% mid diagonal, normal LV function.  Plan is to continue medical therapy.  DISCHARGE MEDICATIONS: 1. Coated aspirin q.d. 2. Nitroglycerin sublingual p.r.n. 3. Lipitor 10 mg a day. 4. Toprol-XL 50 mg a day.  LABORATORY DATA:  CK-MB and troponins are negative.  Lipid profile shows a cholesterol 217, HDL 33, LDL 150.  INR 1.1.  Hematology shows a white count 7.2, hemoglobin 14.9, hematocrit 42.5, platelets 286.  Sodium 134, potassium 4.5, BUN 9, creatinine 1.0.  EKG shows sinus rhythm, sinus bradycardia, nonspecific ST changes.  Chest x-Kobi shows no active disease.  DISPOSITION:  Patient is discharged in stable condition.  FOLLOW-UP:  With Dr. Delia Chimes P.A. in the office Monday, January 21 at 10:15 a.m. DD:  08/15/00 TD:  08/15/00 Job: [id number]

## 2010-12-22 NOTE — Cardiovascular Report (Signed)
NAME:  [name], [name]                    ACCOUNT NO.:  [id number]   MEDICAL RECORD NO.:  [id number]          PATIENT TYPE:  INP   LOCATION:  6526                         FACILITY:  [facility]   PHYSICIAN:  Bruce R. Juanda Chance, MD, FACCDATE OF BIRTH:  [birth year]   DATE OF PROCEDURE:  10/16/2006  DATE OF DISCHARGE:  10/17/2006                            CARDIAC CATHETERIZATION   CLINICAL HISTORY:  Mr. Lacock is 60 years old and [place].  In 2000 he had an anterior MI treated with a NIR stent to the  LAD by Dr. Gerri Spore.  He had no evidence of restenosis 2 years later at  angiography.  He has degenerative disk disease in his low back and was  scheduled for surgery by Dr. Nelda Severe today but gave a history of  chest pain.  His chest pain was felt to represent unstable angina and he  was admitted to our service.  He was scheduled for evaluation and  angiography today.   PROCEDURE:  Left heart catheterization was performed percutaneously via  the right femoral artery using an arterial sheath and 6-French preformed  coronary catheters.  A front wall arterial puncture was performed and  Omnipaque contrast was used.   After completing the diagnostic study we made a decision to proceed the  lesion, which was an in-stent restenotic lesion in the proximal LAD.  The patient given Angiomax bolus and infusion.  He was given a 600 mg  Plavix load with 20 mg of Pepcid.  He had been given 325 mg of non-  enteric-coated aspirin.  We chose a Q-4 6-French guiding catheter with  side holes.  We crossed the lesion in the proximal LAD with a Prowater  wire without difficulty.  We dilated with a 3.0 x 15 mm cutting balloon,  performing three inflations up to 8 atmospheres for 30 seconds.  The  patient developed marked ST elevation and chest pain with the balloon  inflations.  We then postdilated with a 3.25 x 15-mm Quantum Maverick,  performing one inflation up to 16 atmospheres for 30 seconds.   The stent  that had been placed in the LAD was a 3.0 x 16 mm NIR stent, which was  postdilated with a 3.5 balloon.   The patient tolerated the procedure well.  The right femoral artery was  closed with an Angio-Seal at the end of the procedure.   RESULTS:  The aortic pressure was 121/73 with mean of 94.  The left  ventricular pressure was 121/18.   Left main coronary:  The left main coronary was free of significant  disease.   Left anterior descending artery:  The left anterior descending artery  gave rise to a diagonal branch and a septal perforator.  There was 95%  in-stent restenosis within the NIR stent in the proximal LAD.  This was  distributed diffusely throughout the 16 mm stent.   The circumflex artery:  The circumflex artery gave rise to an atrial  branch, a marginal branch and a posterolateral branch.  There was 30%  segmental  narrowing in the proximal circumflex artery.   The right coronary artery:  The right coronary artery is a moderate-  sized vessel that gave rise to a ventricular branch, a second right  ventricle branch that supplied part of the inferior septum, a posterior  branch, and two posterolateral branches.  There were irregularities in  the proximal mid right coronary with 30% narrowing in the proximal  vessel and 30% narrowing in the midvessel.   Left ventriculogram:  The left ventriculogram performed in the RAO  projection showed good wall motion with no areas of hypokinesis.  The  estimated ejection fraction was 55%.   CONCLUSION:  1. Coronary artery disease, status post prior anterior wall myocardial      infarction treated with a NIR to the proximal left anterior      descending artery in 2000 with 95% in-stent restenosis in the      proximal left anterior descending artery, 30% narrowing in the      proximal circumflex artery, 30% narrowing in the proximal and mid      right coronary, and normal left ventricular function.  2. Successful cutting  balloon angioplasty for in-stent restenosis in      the proximal left anterior descending artery with improvement      percent of narrowing from 95% to less than 10%.   DISPOSITION:  The patient returned to the post-angioplasty unit.  The  patient was returned to 6500 for further observation.  We chose a  cutting balloon rather than a medicated stent because of concerns about  compliance and because of the probable need for early back surgery.  Will plan to continue Plavix for 1 month and will probably target  surgery sometime after 6 weeks.      Bruce Elvera Lennox Juanda Chance, MD, [facility]  Electronically Signed     BRB/MEDQ  D:  10/16/2006  T:  10/18/2006  Job:  [id number]   cc:   Cardiopulmonary Lab  Nelda Severe, MD  Veverly Fells. Excell Seltzer, MD

## 2010-12-22 NOTE — Cardiovascular Report (Signed)
[facility]  Patient:    [name], [name]                             MRN: [id number] Proc. Date: 08/14/00 Adm. Date:  [id number] Attending:  Mirian Mo CC:         Raliegh Ip, M.D., [place], [place]  Madolyn Frieze. Jens Som, M.D. [facility]   Cardiac Catheterization  PRIMARY CARE PHYSICIAN:  Dr. Raliegh Ip.  CLINICAL HISTORY:  Mr. Boen is 60 years old and had a stent placed to his LAD in January 2000.  He recently had some chest pain and called in to get a renewal of his nitroglycerin and was asked to come to the emergency room and then was subsequently admitted.  Other problems include hypertension, hyperlipidemia.  He has not been very compliant regarding cholesterol medications and other medications due to financial reasons.  He works for a [employer] in [place].  DESCRIPTION OF PROCEDURE:  The procedure was performed via the right femoral artery using an arterial sheath and 6-French preformed coronary catheters.  A front wall arterial puncture was performed.  Omnipaque contrast was used.  The right femoral artery was closed with Perclose at the end of the procedure. The patient tolerated the procedure well and left the laboratory in satisfactory condition.  RESULTS:  Aortic pressure was 113/71 with a mean of 86.  Left ventricular pressure was 113/19.  1. The left main coronary artery:  The left main coronary artery was free of    significant disease. 2. The left anterior descending artery:  The left anterior descending artery    gave rise to a diagonal branch and a septal perforator.  The LAD distally    tapered into a small vessel and did not quite reach the apex.  There was 0%    stenosis at the stent site in the proximal LAD.  There was 50% narrowing in    the diagonal branch of the LAD. 3. The circumflex artery:  The left circumflex artery gave rise to an atrial    branch, a small marginal branch, a large marginal  branch, and a    posterolateral branch.  These vessels were free of significant disease. 4. The right coronary artery:  The right coronary artery was a moderate sized    vessel that gave rise to a conus branch, two right ventricular branches, a    posterior descending branch, and a small posterolateral branch.  There were    tandem 30% stenoses in the mid vessel.  Left ventriculogram:  The left ventriculogram, performed in the RAO projection, showed good wall motion with no areas of hypokinesis.  The estimated ejection fraction was 60%.  CONCLUSION:  Coronary artery disease status post stenting of the proximal LAD, January 2000, with 0% stenosis at the stent site, 50% narrowing in the diagonal branch to the LAD, no major obstruction in the circumflex artery, 30% narrowing in the mid right coronary artery, and normal LV function.  RECOMMENDATIONS:  There is no source of ischemia.  Will plan reassurance and continued risk factor modification and plan to let the patient go home later today. DD:  08/14/00 TD:  08/14/00 Job: [id number]

## 2010-12-22 NOTE — Assessment & Plan Note (Signed)
[facility]                          [facility] OFF[facility] NOTE   NAME:[name], MIRON MARXEN                           MRN:          [id number]  DATE:11/11/2006                            DOB:          [birth year]    PRIMARY CARE PHYSICIANS:  Cardiologist:  Learta Codding, M.D. (He is new  to Dr. Andee Lineman).   HISTORY OF PRESENT ILLNESS:  [name] is a 60 year old male patient with  a history of coronary artery disease status post bare metal stenting to  the left anterior descending in 2000 who recently presented to [facility] with unstable angina pectoris and underwent cutting  balloon angioplasty to the proximal left anterior descending.  Please  see my note from October 28, 2006 for complete details.  The patient  returns to the office today for followup.  He denies any chest pain or  shortness of breath.  He denies any syncope.  He does get light headed  when he stands from time to time.  He denies any syncope.  His wife does  not that he has sleep apnea.  He does not wear his CPAP machine.  He  usually wakes up with a headache and has been quite fatigued.  He has  been sleeping during the day.  He also notes some right groin pain.  This was not present when I saw him last time.  He also notes some  nausea.  He says he has been getting nauseated every since he started  taking the medication. He does have a history of gastroesophageal reflux  disease and has been evaluated for dysphagia in the past.  He does have  dysphagia but denies odynophagia.  He also feels quite anxious from time  to time and would like to know if he could be placed on Xanax.   CURRENT MEDICATIONS:  1. Aspirin 325 mg daily.  2. Plavix 75 mg daily.  3. Lipitor 80 mg q.h.s.  4. Toprol XL 50 mg daily.  5. Nitroglycerin p.r.n. chest pain.   ALLERGIES:  No known drug allergies.   PHYSICAL EXAMINATION:  GENERAL APPEARANCE:  He is well-nourished, well-  developed man in no  distress.  VITAL SIGNS:  Blood pressure 142/91.  Orthostatic vital signs blood  pressure lying is 141/88, with pulse 61.  Sitting blood pressure 148/87  with pulse of 66, after standing 136/85 with pulse of 67, after three  minutes 139/83 with pulse of 67.  HEENT:  Unremarkable.  NECK:  No jugular venous distention.  CARDIOVASCULAR:  Normal S1, S2, regular rate and rhythm without murmurs.  LUNGS:  Clear to auscultation bilaterally without wheezing, rhonchi or  rales.  ABDOMEN: Soft, nontender without edema.  EXTREMITIES:  Calves are soft, nontender.  SKIN:  Warm and dry.  Right femoral arteriotomy site without hematoma or  bruit.   CLINICAL DATA:  Electrocardiogram reveals sinus rhythm with heart rate  of 64, normal axis, no acute changes.   IMPRESSION:  1. Coronary artery disease.      a.     History of bare  metal stenting to left anterior descending       in 2000.      b.     Recent history of unstable angina pectoris, treated with       cutting balloon angioplasty of the proximal left anterior       descending stent secondary to in-stent restenosis, October 17, 2006.      c.     Good left ventricular function with ejection fraction of 55%       per recent cardiac catheterization.  2. Hypertension, better controlled.  3. Treated dyslipidemia.  4. Degenerative disc disease.      a.     Awaiting back surgery.  5. Chronic obstructive pulmonary disease.      a.     Tobacco abuse with 100 pack year history.  6. Nausea and dysphagia.      a.     Probable gastroesophageal reflux disease.  7. Dizziness.  8. Anxiety.  9. Right groin pain.      a.     Recent cardiac catheterization.  10.Sleep apnea.   PLAN:  The patient presents to the office today for followup.  His blood  pressure looks better controlled.  We are following him closely until he  comes off his Plavix, prior to his surgery.  He will be done with that  in about one week's time.  I discussed this further with Dr. Myrtis Ser.   We  will bring him back in about two weeks.  He will be off his Plavix for a  week at that time.  As long as he has not had any further anginal  symptoms we should be able to clear him for his surgery.  I have asked  him to go ahead and get in touch with his surgeon to set up an  appointment some time in the next couple of weeks to hopefully get him  set up for surgery.   He does complain of some nausea as well as some dysphagia.  He has  actually had a barium swallow in the past.  I do not have those records.  I think he should be followed gastroenterologist given those symptoms.  I have recommended we go ahead and place him on omeprazole 20 mg a day.  I will try to facilitate referral to a gastroenterologist.   He also does have sleep apnea.  He is currently not on CPAP.  He is  symptomatic from this.  I have recommended that we get him in to see a  sleep medicine specialist to see if there is any further  recommendations.  Will also try to set that up close to his home.   His blood pressure is almost controlled.  Given his symptoms of  dizziness when he stands, I think we will just keep all of his blood  pressure medications the same at this point.  His orthostatic vital  signs do not show any change from lying to standing.   His right groin is painful.  This is new.  There are no signs on exam of  any problems but we will go ahead and set him up for a right groin  ultrasound.  Given some of his abdominal complaints and dizziness, will  also set him up for a CMET and CBC.  As noted above, the patient will  followup in a couple of weeks and will make final recommendations  regarding his surgery at that time.  Tereso Newcomer, PA-C  Electronically Signed      Luis Abed, MD, [facility]  Electronically Signed   SW/MedQ  DD: 11/11/2006  DT: 11/11/2006  Job #: [id number]

## 2010-12-22 NOTE — Discharge Summary (Signed)
[name], [name]                    ACCOUNT NO.:  [id number]   MEDICAL RECORD NO.:  [id number]          PATIENT TYPE:  INP   LOCATION:  5014                         FACILITY:  [facility]   PHYSICIAN:  Nelda Severe, MD      DATE OF BIRTH:  01/14/1951   DATE OF ADMISSION:  02/11/2007  DATE OF DISCHARGE:  02/15/2007                               DISCHARGE SUMMARY   The patient was admitted to [facility] for surgical procedure  by Dr. Nelda Severe.   ADMITTING DIAGNOSIS:  Post laminectomy status L5-S1 disc herniation.   OPERATIVE PROCEDURE:  Posterior lumbar laminectomy fusion/revision L5-S1  with interbody fusion and iliac crest bone graft.   Postoperatively the patient was in a stable condition.  Active range of  motion of bilateral lower extremities was intact.  Blood loss estimated  at 300 mL.  Postoperative day 1 the patient was alert, oriented x3.  No  chest pain.  No shortness of breath.  He is afebrile.  Drain output  total is 175 mL plus.  Hemoglobin stable at 11.4, white count 7.4,  electrolytes within normal limits.  Distally neurovascularly motor  intact grossly.  Bilateral lower extremities:  He complains of posterior  left leg pain, dressing is about 30% bloody drainage at the drain site  proximal.  Bilateral calves are soft, nontender to palpation.  No  erythema.  Advance diet secondary to flatulence.  Decrease is IV fluid  to 50 mL secondary to good p.o. intake of liquids.  Physical therapy  consulted on February 12, 2007 for ambulation and mobility.  Recommend daily  ambulation and mobility training until discontinued to go home.  Postoperative day 2 the patient has walked at this point with rolling  walker and assistance of therapy.  He is afebrile.  He states his left  leg does feel better with weight bearing activities.  Hemoglobin stable  at 12.  White count 9.9.  Sodium 133.  Basic metabolic within normal  limits.  Drain output 35 mL.  Drain was discontinued.   Clean, dry  dressing was applied.  No active drainage apparent on the incision site.  Distally neurovascularly motor intact bilateral lower extremities.  We  discontinued the PCA.  Discontinued the drains.  Postoperative day 3 the  patient was afebrile.  Dressing changed and incision site appears to be  healing well.  The patient is stable.  Temperature 101.2 max.  Current  was 100 on February 14, 2007.  Dressing has very minimal drainage.  No  active drainage from incision.  Hemoglobin stable at 12.0 and white  count 8.6.  Lungs:  Clear to auscultation.  Order a UA secondary to  increased temperature.  Encourage spirometer use.  Postoperative day 4  on February 15, 2007 current temperature 99.1, UA negative for bacterial  infection.  Chest clear to auscultation.  Pain is well controlled on  oral medications.  Incision site is clean, dry and intact.  Distally  neurovascularly motor intact bilateral lower extremities.   PLAN:  Discharge the patient home.   DISPOSITION:  Stable.   DISCHARGE DIAGNOSIS:  Revision lumbar laminectomy L5-S1 with interbody  fusion, iliac crest bone graft.   PLAN:  Walking for daily activities.  No bending, lifting, stooping.  He  may shower.  No dressing is necessary at this point.  He is going to  followup with Dr. Nelda Severe in the office in approximately 4 weeks.  He will be discharged home with a prescription for Demerol, Mepergan  Fortis 1 to 2 q. 4 to 6 p.r.n. for pain control.   ADMITTING LABS:  Include white count on February 10, 2007 at 8.9, hemoglobin  17.3, platelet count 241, PT was 13.3, INR 1.0, PTT 30, sodium was 139,  potassium 4.2, chloride 105, CO2 26, glucose 106, BUN 7, creatinine 104.  Diet is regular.      Lianne Cure, P.A.      Nelda Severe, MD  Electronically Signed    MC/MEDQ  D:  04/04/2007  T:  04/04/2007  Job:  [id number]

## 2010-12-22 NOTE — Discharge Summary (Signed)
NAMEBURNICE, OESTREICHER                    ACCOUNT NO.:  [id number]   MEDICAL RECORD NO.:  [id number]          PATIENT TYPE:  INP   LOCATION:  6526                         FACILITY:  [facility]   PHYSICIAN:  Madolyn Frieze. Jens Som, MD, FACCDATE OF BIRTH:  Jun 02, 1951   DATE OF ADMISSION:  10/15/2006  DATE OF DISCHARGE:  10/17/2006                               DISCHARGE SUMMARY   PRIMARY CARDIOLOGIST:  The patient will follow up with Dr. Lewayne Bunting in  [place].   PRIMARY CARE PHYSICIAN:  The patient does not have one.   PRINCIPAL DIAGNOSIS:  Unstable angina.   SECONDARY DIAGNOSES:  1. Coronary disease.      a.     Status post left anterior descending stenting in 2000, with       bare metal stent.  2. Hypertension.  3. Hyperlipidemia.  4. Tobacco abuse ( a 30 pack-year history).  5. Low back pain, pending lumbar fusion with Dr. Alveda Reasons.   ALLERGIES:  NO KNOWN DRUG ALLERGIES.   PROCEDURE:  Left heart cardiac catheterization, with successful PCI and  stenting of LAD in-stent restenosis with 3 x 16 mm NIR bare metal stent   HISTORY OF PRESENT ILLNESS:  A 60 year old Caucasian male with prior  history of CAD, status post stenting of the proximal LAD in 2000.  He  really has not seen a physician since then.  The patient has been having  problems with low back pain and has been evaluated by Dr. Alveda Reasons, with  plans for lumbar fusion to take place on October 15, 2006.  Unfortunately,  starting on October 12, 2006, the patient began to experience intermittent  rest, exertional, and nocturnal substernal chest discomfort associated  with shortness of breath and radiation to the left arm, similar to  previous angina, lasting 10 or 15 minutes and resolving spontaneously or  with rest.  When he presented for his lumbar fusion on October 15, 2006,  he reported the symptoms to the anesthesia team and was sent directly to  the ED for evaluation.  We saw him in the emergency room, and the  decision was made to admit him  for further evaluation.   HOSPITAL COURSE:  The patient ruled out for MI..  Given the similarity  of his symptoms to previous angina, the decision was made to proceed  with left heart cardiac catheterization, which took place on October 16, 2006, revealing 95% in-stent restenosis in the proximal LAD, and  otherwise nonobstructive coronary artery disease.  His EF was normal at  55%.  Dr. Juanda Chance then successfully placed a 3 x 16 mm near NIR bare  metal stent.  The patient tolerated this procedure well. Postprocedure,  cardiac markers have remained negative, and ECG is without acute  changes.  The patient will be discharged home today in satisfactory  condition.   We have been in contact with Dr. Toni Arthurs office and feel that the  patient will be safe for surgery in 6 weeks.   DISCHARGE LABORATORIES:  Hemoglobin 14.8, hematocrit 42, WBC 6.7,  platelet 219.  Sodium 140, potassium  4.4, chloride 104, CO2 28, BUN 5,  creatinine 1.22, glucose 96, calcium 9.1.  CK-MB 0.8.  Total cholesterol  213, triglycerides 116, HDL 32, LDL 158. Total bilirubin 0.5, alkaline  phosphatase 98, AST 17, ALT 24, albumin 3.5.   DISPOSITION:  The patient is being discharged home today in good  condition.   FOLLOW-UP PLANS AND APPOINTMENTS:  The patient is to follow up Dr. Lewayne Bunting and Tereso Newcomer, P.A., on October 28, 2006 at 9:30 a.m. in our  [place] office.  He is advised to obtain a primary care Derian Dimalanta in his  local area.   DISCHARGE MEDICATIONS:  1. Aspirin 325 mg daily.  2. Plavix 75 mg daily x30 days.  3. Lipitor 80 mg at bedtime.  4. Nitroglycerin 0.4 mg sublingual p.r.n. chest pain/  5. Toprol-XL 25 mg daily.  6. Lyrica 150 mg p.r.n. leg/back pain as previously prescribed.   PENDING LAB STUDIES:  None.   DURATION OF DISCHARGE ENCOUNTER:  40 minutes, including physician time.      Nicolasa Ducking, ANP      Madolyn Frieze. Jens Som, MD, [facility]  Electronically Signed    CB/MEDQ  D:  10/17/2006   T:  10/18/2006  Job:  [id number]

## 2010-12-22 NOTE — Discharge Summary (Signed)
. [facility]  Patient:    [name], [name]                             MRN: [id number] Adm. Date:  [id number] Disc. Date: 05/23/00 Attending:  Madaline Guthrie Dictator:   Gillian Shields, M.D. CC:         Dr. Delfino Lovett, [facility]  [facility]   Discharge Summary  DISCHARGE DIAGNOSES: 1. Ruled out for myocardial infarction. 2. Gastroesophageal reflux disease. 3. Hyperlipidemia. 4. Hypertension.  DISCHARGE MEDICATIONS: 1. Enteric-coated aspirin 325 mg one p.o. q.d. 2. Protonix 40 mg one p.o. q.d. 3. Lipitor 10 mg one p.o. q.d.  PROCEDURES PERFORMED:  A Cardiolite scan showing no myocardial ischemia, a small anteroapical wall scar, and a left ventricular ejection fraction of 51%.  CONSULTATIONS:   Cardiology.  HISTORY OF PRESENT ILLNESS:  The patient presented to the emergency department complaining of chest pain.  He has a history of coronary artery disease, status post PTCA and stenting in January 2000 of the proximal LAD.  At that time ejection fraction was 60%.  He now presents with a left-sided apical chest pain x 1 week, worse with exertion.  Describes this as a pressure and an intermittent sharp pain lasting approximately two minutes and resolving spontaneously.  He took nitroglycerin at home with some relief, but the pain returned.  The patient is radiating to his left shoulder and along the left arm.  He states that this is similar to the prior chest pain he experienced before his stenting.  He felt it became worse 24 hours prior to admission, became constant.  He complained of nausea, diaphoresis, and dyspnea.  Pain exacerbated with exertion.  There were no relieving factors.  He denies paroxysmal nocturnal dyspnea, orthopnea, palpitations, or lower extremity edema.  Denies an history of gastroesophageal reflux disorder.  REVIEW OF SYSTEMS:  Negative for headache, dizziness, vision change, abdominal pain, vomiting,  diarrhea, constipation, melena, hematochezia, or dysuria.  MEDICATIONS:  None.  ALLERGIES:  No known drug allergies.  PAST MEDICAL HISTORY:  Significant for coronary artery disease, status post PTCA and stenting in January 2000, hypertension, hyperlipidemia, back pain, status post appendectomy.  SOCIAL HISTORY:  He is a smoker (one pack a day x 40 years).  Alcohol:  Six beers per week.  Denies illicit drug use.  Lives with his wife.  Employed as a Curator.  PHYSICAL EXAMINATION ON ADMISSION:  VITAL SIGNS:  Temp 97.3, heart rate 66, respiratory rate 20, blood pressure 134/85, oxygen saturation 99% on room air.  HEENT:  Extraocular movements intact.  Pupils equal, round, reactive, light accommodating.  Oropharynx clear.  Head:  Normocephalic, atraumatic.  CARDIOVASCULAR:  Sinus bradycardia, normal S1 and S2, no murmur, rub, or gallop.  CHEST:  Clear to auscultation bilaterally.  ABDOMEN:  Soft, nontender, nondistended, positive bowel sounds.  EXTREMITIES:  Pulses +1, no edema, no clubbing, no cyanosis.  NEUROLOGIC:  Alert and oriented x 3, no focal deficits.  ADMISSION LABORATORY AND X-Teran DATA:  Sodium 143, potassium 3.9, chloride 105, bicarb 28, BUN 11, creatinine 1, glucose of 97.  White count 10.5, hemoglobin 15.4, platelets 259.  Cholesterol 181, triglycerides 139, LDL of 127, HDL of 25.  PTT 39, PT 13.8, INR of 1.1.  First set of cardiac enzymes:  CK 102, MB 0.5, troponin 0.02.  Chest x-Rendon shows no acute disease.  EKG:  Normal sinus rhythm, no Q waves, no ST-T  segment acute changes, unchanged from previous EKG.  HOSPITAL COURSE: #1 - CHEST PAIN WITH A HISTORY OF CORONARY ARTERY DISEASE, HYPERTENSION, HYPERLIPIDEMIA, AND ONGOING TOBACCO ABUSE:  Mr. Kleven was admitted to rule out MI.  He was ruled out by serial cardiac enzymes and EKGs.  Cardiology was consulted to see him, and a Cardiolite scan performed on October 17 showed no myocardial ischemia, a small  anteroapical wall scar, and an ejection fraction of 51%.  He continued to complain of brief episodes of chest pain during his hospitalization lasting 30 seconds to two minutes and resolving spontaneously. These episodes were similar in character to those he experienced immediately prior to admission.  They were relieved with an H2-blocker.  He also on the evening prior to discharge complained of an acute abdominal pain, sharp, severe, and tenderness in the bilateral lower quadrants.  His abdomen was soft with normal bowel sounds.  He had continued to tolerate a normal diet and is stooling regularly.  He responded well to morphine and Donnatal.  At this time suspect that gastroesophageal reflux disease may be the source of his chest pain.  He will start on Protonix for the next six weeks and be followed to see if this relieves his pain.  Regarding his abdominal pain, recommend expectant management and anticipate that this will resolve spontaneously once he is discharged from the hospital and resumes his regular diet.  #2 - HYPERTENSION:  Mr. Eggert has been normotensive throughout this hospitalization.  He will be followed after discharge and if needed can start an ACE inhibitor if indicated.  #3 - HYPERLIPIDEMIA:  Fasting lipids were checked, and he has an elevated cholesterol.  Given his many other cardiac risk factors and a suboptimal HDL, he will begin Lipitor 10 mg q.d. and recommend rechecking a fasting lipid panel in the future to determine his response.  #4 - TOBACCO ABUSE:  Tobacco cessation counseling was offered, and he refuses assistance with tobacco cessation at this time.  DISCHARGE LABORATORIES:  None.  There is no pending laboratory work at the time of discharge.  DISCHARGE FOLLOWUP:  [facility] with Dr. Maple Hudson or Dr. Dionicia Abler in three to four weeks to assess whether Pantoprazole has  alleviated his chest pain complaints or he may follow  up with Dr. Vear Clock in [place] if he prefers. DD:  05/23/00 TD:  05/23/00 Job: [id number]

## 2011-05-07 LAB — BASIC METABOLIC PANEL
BUN: 11
BUN: 12
BUN: 14
CO2: 25
CO2: 25
CO2: 25
CO2: 26
CO2: 27
CO2: 28
Calcium: 8.9
Calcium: 8.9
Calcium: 9
Chloride: 105
Chloride: 106
Chloride: 107
Chloride: 107
Chloride: 107
Chloride: 109
Creatinine, Ser: 0.88
Creatinine, Ser: 1.01
Creatinine, Ser: 1.06
GFR calc Af Amer: >60
Glucose, Bld: 105 — ABNORMAL HIGH
Glucose, Bld: 110 — ABNORMAL HIGH
Glucose, Bld: 88
Glucose, Bld: 94
Potassium: 4
Potassium: 4.1
Potassium: 4.2
Sodium: 138
Sodium: 139

## 2011-05-07 LAB — CBC
HCT: 43.5
Hemoglobin: 14
Hemoglobin: 14.9
Hemoglobin: 15.2
MCHC: 34.3
MCHC: 34.4
MCHC: 34.8
MCHC: 34.8
MCV: 91.3
MCV: 92
MCV: 92.3
Platelets: 133 — ABNORMAL LOW
RBC: 4.46
RBC: 4.72
RBC: 4.82
RDW: 12.7
RDW: 12.8
RDW: 13
WBC: 7.8

## 2011-05-07 LAB — CSF CELL COUNT WITH DIFFERENTIAL
RBC Count, CSF: 2 — ABNORMAL HIGH
WBC, CSF: 1

## 2011-05-07 LAB — VITAMIN B12: Vitamin B-12: 388 (ref 211–911)

## 2011-05-07 LAB — PROTEIN AND GLUCOSE, CSF: Glucose, CSF: 71

## 2011-05-09 LAB — COMPREHENSIVE METABOLIC PANEL
AST: 14
BUN: 9
CO2: 27
Calcium: 8.8
Chloride: 110
Creatinine, Ser: 1.1
GFR calc Af Amer: >60
GFR calc non Af Amer: >60
Glucose, Bld: 112 — ABNORMAL HIGH
Total Bilirubin: 0.7

## 2011-05-09 LAB — CBC
HCT: 41.7
HCT: 42.2
HCT: 42.9
HCT: 49.3
Hemoglobin: 14.5
Hemoglobin: 14.5
Hemoglobin: 14.9
Hemoglobin: 17
MCHC: 33.8
MCHC: 34.5
MCHC: 34.8
MCV: 91.1
MCV: 93.7
Platelets: 209
RBC: 4.59
RBC: 5.41
RDW: 12.9
RDW: 13.3
WBC: 7.4
WBC: 8.3
WBC: 9.2

## 2011-05-09 LAB — BASIC METABOLIC PANEL
BUN: 12
BUN: 7
CO2: 26
CO2: 27
CO2: 30
Calcium: 8.5
Calcium: 8.8
Chloride: 104
Chloride: 107
Creatinine, Ser: 0.95
GFR calc Af Amer: >60
GFR calc Af Amer: >60
GFR calc non Af Amer: >60
Glucose, Bld: 102 — ABNORMAL HIGH
Glucose, Bld: 97
Potassium: 3.9
Potassium: 4
Potassium: 4.3
Potassium: 4.3
Sodium: 139
Sodium: 139
Sodium: 140

## 2011-05-09 LAB — HEPATIC FUNCTION PANEL
AST: 16
Albumin: 2.9 — ABNORMAL LOW
Alkaline Phosphatase: 77
Bilirubin, Direct: <0.1
Total Bilirubin: 0.5
Total Protein: 5.8 — ABNORMAL LOW

## 2011-05-09 LAB — DIFFERENTIAL
Basophils Absolute: 0
Eosinophils Relative: 2
Lymphocytes Relative: 21
Lymphs Abs: 1.5
Neutro Abs: 5

## 2011-05-09 LAB — PROTIME-INR
INR: 1
Prothrombin Time: 13.7

## 2011-05-09 LAB — LIPID PANEL: HDL: 32 — ABNORMAL LOW

## 2011-05-09 LAB — LIPASE, BLOOD: Lipase: 24

## 2011-05-09 LAB — CARDIAC PANEL(CRET KIN+CKTOT+MB+TROPI)
CK, MB: 0.8
Relative Index: INVALID
Relative Index: INVALID
Total CK: 52
Troponin I: 0.01

## 2011-05-09 LAB — AMYLASE: Amylase: 68

## 2011-05-09 LAB — MAGNESIUM: Magnesium: 2.4

## 2011-05-09 LAB — HEPARIN LEVEL (UNFRACTIONATED): Heparin Unfractionated: 0.35

## 2011-05-09 LAB — HEMOGLOBIN A1C: Hgb A1c MFr Bld: 5.3

## 2011-05-22 LAB — CBC
HCT: 33 — ABNORMAL LOW
HCT: 41.6
HCT: 50.2
Hemoglobin: 12 — ABNORMAL LOW
Hemoglobin: 14.3
MCHC: 34.5
MCHC: 34.8
MCHC: 35
MCV: 90
MCV: 90.3
MCV: 90.3
MCV: 90.4
MCV: 90.5
Platelets: 160
Platelets: 186
Platelets: 219
Platelets: 241
RBC: 3.83 — ABNORMAL LOW
RBC: 4.62
RDW: 12.9
RDW: 13.2
RDW: 13.5
RDW: 14
WBC: 14.7 — ABNORMAL HIGH
WBC: 7.4
WBC: 9.9

## 2011-05-22 LAB — DIFFERENTIAL
Basophils Absolute: 0
Lymphocytes Relative: 16
Lymphs Abs: 1.4
Monocytes Absolute: 0.6
Neutro Abs: 6.7

## 2011-05-22 LAB — BASIC METABOLIC PANEL
BUN: 5 — ABNORMAL LOW
BUN: 7
BUN: 7
CO2: 27
CO2: 29
Calcium: 8.4
Calcium: 8.6
Chloride: 102
Chloride: 102
Chloride: 104
Chloride: 98
Creatinine, Ser: 1
Creatinine, Ser: 1.09
Creatinine, Ser: 1.15
GFR calc Af Amer: >60
GFR calc Af Amer: >60
GFR calc non Af Amer: >60
GFR calc non Af Amer: >60
Glucose, Bld: 123 — ABNORMAL HIGH
Potassium: 3.9
Potassium: 4.3
Sodium: 139

## 2011-05-22 LAB — URINALYSIS, ROUTINE W REFLEX MICROSCOPIC
Glucose, UA: NEGATIVE
Hgb urine dipstick: NEGATIVE
Ketones, ur: NEGATIVE
Ketones, ur: NEGATIVE
Nitrite: NEGATIVE
Protein, ur: NEGATIVE
Protein, ur: NEGATIVE
Urobilinogen, UA: 0.2
pH: 6

## 2011-05-22 LAB — COMPREHENSIVE METABOLIC PANEL
AST: 27
Albumin: 3.9
BUN: 7
Calcium: 9.8
Chloride: 105
Creatinine, Ser: 1.04
GFR calc Af Amer: >60
Total Bilirubin: 0.5
Total Protein: 7.2

## 2011-05-22 LAB — TYPE AND SCREEN: Antibody Screen: NEGATIVE

## 2011-05-22 LAB — APTT: aPTT: 30

## 2011-05-22 LAB — URINE CULTURE

## 2011-08-07 HISTORY — PX: TOTAL HIP ARTHROPLASTY: SHX124

## 2011-08-20 DIAGNOSIS — R110 Nausea: Secondary | ICD-10-CM | POA: Insufficient documentation

## 2014-08-09 DIAGNOSIS — I119 Hypertensive heart disease without heart failure: Secondary | ICD-10-CM | POA: Diagnosis not present

## 2014-08-09 DIAGNOSIS — F17210 Nicotine dependence, cigarettes, uncomplicated: Secondary | ICD-10-CM | POA: Diagnosis not present

## 2014-08-09 DIAGNOSIS — Z79899 Other long term (current) drug therapy: Secondary | ICD-10-CM | POA: Diagnosis not present

## 2014-08-09 DIAGNOSIS — R079 Chest pain, unspecified: Secondary | ICD-10-CM | POA: Diagnosis not present

## 2014-08-09 DIAGNOSIS — I200 Unstable angina: Secondary | ICD-10-CM | POA: Diagnosis not present

## 2014-08-09 DIAGNOSIS — R112 Nausea with vomiting, unspecified: Secondary | ICD-10-CM | POA: Diagnosis not present

## 2014-08-10 DIAGNOSIS — M79603 Pain in arm, unspecified: Secondary | ICD-10-CM | POA: Diagnosis not present

## 2014-08-10 DIAGNOSIS — R110 Nausea: Secondary | ICD-10-CM | POA: Diagnosis not present

## 2014-08-10 DIAGNOSIS — Z7982 Long term (current) use of aspirin: Secondary | ICD-10-CM | POA: Diagnosis not present

## 2014-08-10 DIAGNOSIS — Z87891 Personal history of nicotine dependence: Secondary | ICD-10-CM | POA: Diagnosis not present

## 2014-08-10 DIAGNOSIS — I517 Cardiomegaly: Secondary | ICD-10-CM | POA: Diagnosis not present

## 2014-08-10 DIAGNOSIS — R0789 Other chest pain: Secondary | ICD-10-CM | POA: Diagnosis not present

## 2014-08-10 DIAGNOSIS — Z951 Presence of aortocoronary bypass graft: Secondary | ICD-10-CM | POA: Diagnosis not present

## 2014-08-10 DIAGNOSIS — R079 Chest pain, unspecified: Secondary | ICD-10-CM | POA: Diagnosis not present

## 2014-08-10 DIAGNOSIS — I25110 Atherosclerotic heart disease of native coronary artery with unstable angina pectoris: Secondary | ICD-10-CM | POA: Diagnosis not present

## 2014-08-10 DIAGNOSIS — I209 Angina pectoris, unspecified: Secondary | ICD-10-CM | POA: Diagnosis not present

## 2014-08-10 DIAGNOSIS — Z9114 Patient's other noncompliance with medication regimen: Secondary | ICD-10-CM | POA: Diagnosis not present

## 2014-08-10 DIAGNOSIS — I10 Essential (primary) hypertension: Secondary | ICD-10-CM | POA: Diagnosis not present

## 2014-08-10 DIAGNOSIS — R61 Generalized hyperhidrosis: Secondary | ICD-10-CM | POA: Diagnosis not present

## 2014-08-10 DIAGNOSIS — I2510 Atherosclerotic heart disease of native coronary artery without angina pectoris: Secondary | ICD-10-CM | POA: Diagnosis not present

## 2014-08-10 DIAGNOSIS — I252 Old myocardial infarction: Secondary | ICD-10-CM | POA: Diagnosis not present

## 2014-08-10 DIAGNOSIS — J449 Chronic obstructive pulmonary disease, unspecified: Secondary | ICD-10-CM | POA: Diagnosis not present

## 2014-08-10 DIAGNOSIS — R200 Anesthesia of skin: Secondary | ICD-10-CM | POA: Diagnosis not present

## 2014-08-10 DIAGNOSIS — R9431 Abnormal electrocardiogram [ECG] [EKG]: Secondary | ICD-10-CM | POA: Diagnosis not present

## 2014-08-19 DIAGNOSIS — I2510 Atherosclerotic heart disease of native coronary artery without angina pectoris: Secondary | ICD-10-CM | POA: Diagnosis not present

## 2014-08-19 DIAGNOSIS — C61 Malignant neoplasm of prostate: Secondary | ICD-10-CM | POA: Diagnosis not present

## 2014-08-19 DIAGNOSIS — J449 Chronic obstructive pulmonary disease, unspecified: Secondary | ICD-10-CM | POA: Diagnosis not present

## 2014-09-09 DIAGNOSIS — R102 Pelvic and perineal pain: Secondary | ICD-10-CM | POA: Diagnosis not present

## 2014-09-09 DIAGNOSIS — C61 Malignant neoplasm of prostate: Secondary | ICD-10-CM | POA: Diagnosis not present

## 2014-09-26 DIAGNOSIS — Z79899 Other long term (current) drug therapy: Secondary | ICD-10-CM | POA: Diagnosis not present

## 2014-09-26 DIAGNOSIS — R51 Headache: Secondary | ICD-10-CM | POA: Diagnosis not present

## 2014-09-26 DIAGNOSIS — R202 Paresthesia of skin: Secondary | ICD-10-CM | POA: Diagnosis not present

## 2014-09-26 DIAGNOSIS — M542 Cervicalgia: Secondary | ICD-10-CM | POA: Diagnosis not present

## 2014-09-26 DIAGNOSIS — Z7409 Other reduced mobility: Secondary | ICD-10-CM | POA: Diagnosis not present

## 2014-09-26 DIAGNOSIS — D72829 Elevated white blood cell count, unspecified: Secondary | ICD-10-CM | POA: Diagnosis not present

## 2014-09-26 DIAGNOSIS — R531 Weakness: Secondary | ICD-10-CM | POA: Diagnosis not present

## 2014-09-26 DIAGNOSIS — I2510 Atherosclerotic heart disease of native coronary artery without angina pectoris: Secondary | ICD-10-CM | POA: Diagnosis not present

## 2014-09-26 DIAGNOSIS — R471 Dysarthria and anarthria: Secondary | ICD-10-CM | POA: Diagnosis not present

## 2014-09-26 DIAGNOSIS — Z7982 Long term (current) use of aspirin: Secondary | ICD-10-CM | POA: Diagnosis not present

## 2014-09-26 DIAGNOSIS — B0230 Zoster ocular disease, unspecified: Secondary | ICD-10-CM | POA: Diagnosis not present

## 2014-09-26 DIAGNOSIS — J449 Chronic obstructive pulmonary disease, unspecified: Secondary | ICD-10-CM | POA: Diagnosis not present

## 2014-09-26 DIAGNOSIS — I6789 Other cerebrovascular disease: Secondary | ICD-10-CM | POA: Diagnosis not present

## 2014-09-26 DIAGNOSIS — I10 Essential (primary) hypertension: Secondary | ICD-10-CM | POA: Diagnosis not present

## 2014-09-26 DIAGNOSIS — G43809 Other migraine, not intractable, without status migrainosus: Secondary | ICD-10-CM | POA: Diagnosis not present

## 2014-09-26 DIAGNOSIS — I639 Cerebral infarction, unspecified: Secondary | ICD-10-CM | POA: Diagnosis not present

## 2014-09-26 DIAGNOSIS — F17210 Nicotine dependence, cigarettes, uncomplicated: Secondary | ICD-10-CM | POA: Diagnosis not present

## 2014-09-26 DIAGNOSIS — B029 Zoster without complications: Secondary | ICD-10-CM | POA: Diagnosis not present

## 2014-09-26 DIAGNOSIS — E785 Hyperlipidemia, unspecified: Secondary | ICD-10-CM | POA: Diagnosis not present

## 2014-09-26 DIAGNOSIS — K219 Gastro-esophageal reflux disease without esophagitis: Secondary | ICD-10-CM | POA: Diagnosis not present

## 2014-09-26 DIAGNOSIS — Z87891 Personal history of nicotine dependence: Secondary | ICD-10-CM | POA: Diagnosis not present

## 2014-09-27 DIAGNOSIS — R531 Weakness: Secondary | ICD-10-CM | POA: Diagnosis not present

## 2014-09-27 DIAGNOSIS — H53149 Visual discomfort, unspecified: Secondary | ICD-10-CM | POA: Diagnosis not present

## 2014-09-27 DIAGNOSIS — G43909 Migraine, unspecified, not intractable, without status migrainosus: Secondary | ICD-10-CM | POA: Diagnosis not present

## 2014-09-27 DIAGNOSIS — R110 Nausea: Secondary | ICD-10-CM | POA: Diagnosis not present

## 2014-09-28 DIAGNOSIS — G43909 Migraine, unspecified, not intractable, without status migrainosus: Secondary | ICD-10-CM | POA: Diagnosis not present

## 2014-09-28 DIAGNOSIS — R531 Weakness: Secondary | ICD-10-CM | POA: Diagnosis not present

## 2014-09-28 DIAGNOSIS — D72829 Elevated white blood cell count, unspecified: Secondary | ICD-10-CM | POA: Diagnosis not present

## 2014-09-29 DIAGNOSIS — R531 Weakness: Secondary | ICD-10-CM | POA: Diagnosis not present

## 2014-09-29 DIAGNOSIS — D72829 Elevated white blood cell count, unspecified: Secondary | ICD-10-CM | POA: Diagnosis not present

## 2014-09-29 DIAGNOSIS — G43909 Migraine, unspecified, not intractable, without status migrainosus: Secondary | ICD-10-CM | POA: Diagnosis not present

## 2014-09-29 DIAGNOSIS — G43009 Migraine without aura, not intractable, without status migrainosus: Secondary | ICD-10-CM | POA: Insufficient documentation

## 2014-09-30 DIAGNOSIS — B029 Zoster without complications: Secondary | ICD-10-CM | POA: Insufficient documentation

## 2014-09-30 DIAGNOSIS — B0230 Zoster ocular disease, unspecified: Secondary | ICD-10-CM | POA: Diagnosis not present

## 2014-09-30 DIAGNOSIS — G43909 Migraine, unspecified, not intractable, without status migrainosus: Secondary | ICD-10-CM | POA: Diagnosis not present

## 2014-09-30 DIAGNOSIS — D72829 Elevated white blood cell count, unspecified: Secondary | ICD-10-CM | POA: Diagnosis not present

## 2014-09-30 DIAGNOSIS — R531 Weakness: Secondary | ICD-10-CM | POA: Diagnosis not present

## 2014-10-04 DIAGNOSIS — B0230 Zoster ocular disease, unspecified: Secondary | ICD-10-CM | POA: Diagnosis not present

## 2014-10-04 DIAGNOSIS — B356 Tinea cruris: Secondary | ICD-10-CM | POA: Diagnosis not present

## 2014-10-14 DIAGNOSIS — B0230 Zoster ocular disease, unspecified: Secondary | ICD-10-CM | POA: Diagnosis not present

## 2014-10-18 DIAGNOSIS — B0233 Zoster keratitis: Secondary | ICD-10-CM | POA: Diagnosis not present

## 2014-10-21 DIAGNOSIS — B0233 Zoster keratitis: Secondary | ICD-10-CM | POA: Diagnosis not present

## 2014-10-25 DIAGNOSIS — B0233 Zoster keratitis: Secondary | ICD-10-CM | POA: Diagnosis not present

## 2014-10-31 DIAGNOSIS — I10 Essential (primary) hypertension: Secondary | ICD-10-CM | POA: Diagnosis not present

## 2014-10-31 DIAGNOSIS — Z720 Tobacco use: Secondary | ICD-10-CM | POA: Diagnosis not present

## 2014-10-31 DIAGNOSIS — E669 Obesity, unspecified: Secondary | ICD-10-CM | POA: Diagnosis not present

## 2014-10-31 DIAGNOSIS — Z95818 Presence of other cardiac implants and grafts: Secondary | ICD-10-CM | POA: Diagnosis not present

## 2014-10-31 DIAGNOSIS — B0229 Other postherpetic nervous system involvement: Secondary | ICD-10-CM | POA: Diagnosis not present

## 2014-10-31 DIAGNOSIS — F17210 Nicotine dependence, cigarettes, uncomplicated: Secondary | ICD-10-CM | POA: Diagnosis not present

## 2014-10-31 DIAGNOSIS — Z9861 Coronary angioplasty status: Secondary | ICD-10-CM | POA: Diagnosis not present

## 2014-10-31 DIAGNOSIS — E785 Hyperlipidemia, unspecified: Secondary | ICD-10-CM | POA: Diagnosis not present

## 2014-10-31 DIAGNOSIS — Z79899 Other long term (current) drug therapy: Secondary | ICD-10-CM | POA: Diagnosis not present

## 2014-10-31 DIAGNOSIS — J029 Acute pharyngitis, unspecified: Secondary | ICD-10-CM | POA: Diagnosis not present

## 2014-11-05 DIAGNOSIS — B0222 Postherpetic trigeminal neuralgia: Secondary | ICD-10-CM | POA: Diagnosis not present

## 2014-11-08 DIAGNOSIS — H40023 Open angle with borderline findings, high risk, bilateral: Secondary | ICD-10-CM | POA: Diagnosis not present

## 2014-11-08 DIAGNOSIS — B0233 Zoster keratitis: Secondary | ICD-10-CM | POA: Diagnosis not present

## 2014-11-12 DIAGNOSIS — E785 Hyperlipidemia, unspecified: Secondary | ICD-10-CM | POA: Diagnosis not present

## 2014-11-12 DIAGNOSIS — I2510 Atherosclerotic heart disease of native coronary artery without angina pectoris: Secondary | ICD-10-CM | POA: Diagnosis not present

## 2014-11-12 DIAGNOSIS — B0222 Postherpetic trigeminal neuralgia: Secondary | ICD-10-CM | POA: Diagnosis not present

## 2014-11-12 DIAGNOSIS — G4700 Insomnia, unspecified: Secondary | ICD-10-CM | POA: Diagnosis not present

## 2014-11-12 DIAGNOSIS — I10 Essential (primary) hypertension: Secondary | ICD-10-CM | POA: Diagnosis not present

## 2014-11-12 DIAGNOSIS — C61 Malignant neoplasm of prostate: Secondary | ICD-10-CM | POA: Diagnosis not present

## 2014-11-15 DIAGNOSIS — B0222 Postherpetic trigeminal neuralgia: Secondary | ICD-10-CM | POA: Diagnosis not present

## 2014-11-18 DIAGNOSIS — R110 Nausea: Secondary | ICD-10-CM | POA: Diagnosis not present

## 2014-11-18 DIAGNOSIS — B0222 Postherpetic trigeminal neuralgia: Secondary | ICD-10-CM | POA: Diagnosis not present

## 2014-11-18 DIAGNOSIS — E781 Pure hyperglyceridemia: Secondary | ICD-10-CM | POA: Diagnosis not present

## 2014-11-18 DIAGNOSIS — E784 Other hyperlipidemia: Secondary | ICD-10-CM | POA: Diagnosis not present

## 2014-11-18 DIAGNOSIS — E783 Hyperchylomicronemia: Secondary | ICD-10-CM | POA: Diagnosis not present

## 2014-11-18 DIAGNOSIS — I10 Essential (primary) hypertension: Secondary | ICD-10-CM | POA: Diagnosis not present

## 2014-11-18 DIAGNOSIS — E785 Hyperlipidemia, unspecified: Secondary | ICD-10-CM | POA: Diagnosis not present

## 2014-11-18 DIAGNOSIS — E782 Mixed hyperlipidemia: Secondary | ICD-10-CM | POA: Diagnosis not present

## 2014-11-18 DIAGNOSIS — E780 Pure hypercholesterolemia: Secondary | ICD-10-CM | POA: Diagnosis not present

## 2014-11-21 DIAGNOSIS — B0229 Other postherpetic nervous system involvement: Secondary | ICD-10-CM | POA: Diagnosis not present

## 2014-11-21 DIAGNOSIS — J449 Chronic obstructive pulmonary disease, unspecified: Secondary | ICD-10-CM | POA: Diagnosis not present

## 2014-11-21 DIAGNOSIS — B0222 Postherpetic trigeminal neuralgia: Secondary | ICD-10-CM | POA: Diagnosis not present

## 2014-11-21 DIAGNOSIS — R51 Headache: Secondary | ICD-10-CM | POA: Diagnosis not present

## 2014-11-21 DIAGNOSIS — E780 Pure hypercholesterolemia: Secondary | ICD-10-CM | POA: Diagnosis not present

## 2014-11-21 DIAGNOSIS — R220 Localized swelling, mass and lump, head: Secondary | ICD-10-CM | POA: Diagnosis not present

## 2014-11-21 DIAGNOSIS — F17210 Nicotine dependence, cigarettes, uncomplicated: Secondary | ICD-10-CM | POA: Diagnosis not present

## 2014-11-22 DIAGNOSIS — B0233 Zoster keratitis: Secondary | ICD-10-CM | POA: Diagnosis not present

## 2014-11-22 DIAGNOSIS — H40023 Open angle with borderline findings, high risk, bilateral: Secondary | ICD-10-CM | POA: Diagnosis not present

## 2014-12-23 DIAGNOSIS — C61 Malignant neoplasm of prostate: Secondary | ICD-10-CM | POA: Diagnosis not present

## 2014-12-28 DIAGNOSIS — R102 Pelvic and perineal pain: Secondary | ICD-10-CM | POA: Diagnosis not present

## 2014-12-28 DIAGNOSIS — C61 Malignant neoplasm of prostate: Secondary | ICD-10-CM | POA: Diagnosis not present

## 2015-01-13 DIAGNOSIS — E781 Pure hyperglyceridemia: Secondary | ICD-10-CM | POA: Diagnosis not present

## 2015-01-13 DIAGNOSIS — E782 Mixed hyperlipidemia: Secondary | ICD-10-CM | POA: Diagnosis not present

## 2015-01-13 DIAGNOSIS — E784 Other hyperlipidemia: Secondary | ICD-10-CM | POA: Diagnosis not present

## 2015-01-13 DIAGNOSIS — E780 Pure hypercholesterolemia: Secondary | ICD-10-CM | POA: Diagnosis not present

## 2015-01-13 DIAGNOSIS — E783 Hyperchylomicronemia: Secondary | ICD-10-CM | POA: Diagnosis not present

## 2015-01-13 DIAGNOSIS — I10 Essential (primary) hypertension: Secondary | ICD-10-CM | POA: Diagnosis not present

## 2015-01-13 DIAGNOSIS — B0222 Postherpetic trigeminal neuralgia: Secondary | ICD-10-CM | POA: Diagnosis not present

## 2015-01-13 DIAGNOSIS — G4700 Insomnia, unspecified: Secondary | ICD-10-CM | POA: Diagnosis not present

## 2015-01-13 DIAGNOSIS — E785 Hyperlipidemia, unspecified: Secondary | ICD-10-CM | POA: Diagnosis not present

## 2015-03-15 DIAGNOSIS — G4700 Insomnia, unspecified: Secondary | ICD-10-CM | POA: Diagnosis not present

## 2015-03-15 DIAGNOSIS — C61 Malignant neoplasm of prostate: Secondary | ICD-10-CM | POA: Diagnosis not present

## 2015-03-15 DIAGNOSIS — B0222 Postherpetic trigeminal neuralgia: Secondary | ICD-10-CM | POA: Diagnosis not present

## 2015-03-15 DIAGNOSIS — I10 Essential (primary) hypertension: Secondary | ICD-10-CM | POA: Diagnosis not present

## 2015-03-15 DIAGNOSIS — E782 Mixed hyperlipidemia: Secondary | ICD-10-CM | POA: Diagnosis not present

## 2015-03-15 DIAGNOSIS — E784 Other hyperlipidemia: Secondary | ICD-10-CM | POA: Diagnosis not present

## 2015-03-15 DIAGNOSIS — E783 Hyperchylomicronemia: Secondary | ICD-10-CM | POA: Diagnosis not present

## 2015-03-15 DIAGNOSIS — I2510 Atherosclerotic heart disease of native coronary artery without angina pectoris: Secondary | ICD-10-CM | POA: Diagnosis not present

## 2015-03-15 DIAGNOSIS — E781 Pure hyperglyceridemia: Secondary | ICD-10-CM | POA: Diagnosis not present

## 2015-03-15 DIAGNOSIS — E785 Hyperlipidemia, unspecified: Secondary | ICD-10-CM | POA: Diagnosis not present

## 2015-03-15 DIAGNOSIS — E780 Pure hypercholesterolemia: Secondary | ICD-10-CM | POA: Diagnosis not present

## 2015-03-31 DIAGNOSIS — N529 Male erectile dysfunction, unspecified: Secondary | ICD-10-CM | POA: Diagnosis not present

## 2015-03-31 DIAGNOSIS — C61 Malignant neoplasm of prostate: Secondary | ICD-10-CM | POA: Diagnosis not present

## 2015-06-21 DIAGNOSIS — I10 Essential (primary) hypertension: Secondary | ICD-10-CM | POA: Diagnosis not present

## 2015-06-21 DIAGNOSIS — H538 Other visual disturbances: Secondary | ICD-10-CM | POA: Diagnosis not present

## 2015-06-21 DIAGNOSIS — B0222 Postherpetic trigeminal neuralgia: Secondary | ICD-10-CM | POA: Diagnosis not present

## 2015-06-21 DIAGNOSIS — C61 Malignant neoplasm of prostate: Secondary | ICD-10-CM | POA: Diagnosis not present

## 2015-07-22 DIAGNOSIS — L989 Disorder of the skin and subcutaneous tissue, unspecified: Secondary | ICD-10-CM | POA: Diagnosis not present

## 2015-09-09 DIAGNOSIS — L819 Disorder of pigmentation, unspecified: Secondary | ICD-10-CM | POA: Diagnosis not present

## 2015-11-08 DIAGNOSIS — I25708 Atherosclerosis of coronary artery bypass graft(s), unspecified, with other forms of angina pectoris: Secondary | ICD-10-CM | POA: Diagnosis not present

## 2015-11-08 DIAGNOSIS — E785 Hyperlipidemia, unspecified: Secondary | ICD-10-CM | POA: Diagnosis not present

## 2015-11-08 DIAGNOSIS — I10 Essential (primary) hypertension: Secondary | ICD-10-CM | POA: Diagnosis not present

## 2015-11-08 DIAGNOSIS — J410 Simple chronic bronchitis: Secondary | ICD-10-CM | POA: Diagnosis not present

## 2015-12-06 DIAGNOSIS — I10 Essential (primary) hypertension: Secondary | ICD-10-CM | POA: Diagnosis not present

## 2015-12-06 DIAGNOSIS — E785 Hyperlipidemia, unspecified: Secondary | ICD-10-CM | POA: Diagnosis not present

## 2015-12-06 DIAGNOSIS — I25708 Atherosclerosis of coronary artery bypass graft(s), unspecified, with other forms of angina pectoris: Secondary | ICD-10-CM | POA: Diagnosis not present

## 2015-12-06 DIAGNOSIS — J410 Simple chronic bronchitis: Secondary | ICD-10-CM | POA: Diagnosis not present

## 2015-12-23 DIAGNOSIS — J449 Chronic obstructive pulmonary disease, unspecified: Secondary | ICD-10-CM | POA: Diagnosis not present

## 2015-12-23 DIAGNOSIS — R0789 Other chest pain: Secondary | ICD-10-CM | POA: Diagnosis not present

## 2015-12-23 DIAGNOSIS — R079 Chest pain, unspecified: Secondary | ICD-10-CM | POA: Diagnosis not present

## 2015-12-23 DIAGNOSIS — Z79899 Other long term (current) drug therapy: Secondary | ICD-10-CM | POA: Diagnosis not present

## 2015-12-23 DIAGNOSIS — E7800 Pure hypercholesterolemia, unspecified: Secondary | ICD-10-CM | POA: Diagnosis not present

## 2015-12-23 DIAGNOSIS — R0602 Shortness of breath: Secondary | ICD-10-CM | POA: Diagnosis not present

## 2015-12-23 DIAGNOSIS — F17200 Nicotine dependence, unspecified, uncomplicated: Secondary | ICD-10-CM | POA: Diagnosis not present

## 2015-12-23 DIAGNOSIS — I2510 Atherosclerotic heart disease of native coronary artery without angina pectoris: Secondary | ICD-10-CM | POA: Diagnosis not present

## 2015-12-23 DIAGNOSIS — I252 Old myocardial infarction: Secondary | ICD-10-CM | POA: Diagnosis not present

## 2015-12-23 DIAGNOSIS — R072 Precordial pain: Secondary | ICD-10-CM | POA: Diagnosis not present

## 2015-12-23 DIAGNOSIS — Z951 Presence of aortocoronary bypass graft: Secondary | ICD-10-CM | POA: Diagnosis not present

## 2015-12-24 DIAGNOSIS — R0602 Shortness of breath: Secondary | ICD-10-CM | POA: Diagnosis not present

## 2015-12-24 DIAGNOSIS — R079 Chest pain, unspecified: Secondary | ICD-10-CM | POA: Diagnosis not present

## 2015-12-28 DIAGNOSIS — E785 Hyperlipidemia, unspecified: Secondary | ICD-10-CM | POA: Diagnosis not present

## 2015-12-28 DIAGNOSIS — I10 Essential (primary) hypertension: Secondary | ICD-10-CM | POA: Diagnosis not present

## 2015-12-28 DIAGNOSIS — J410 Simple chronic bronchitis: Secondary | ICD-10-CM | POA: Diagnosis not present

## 2016-01-31 DIAGNOSIS — I10 Essential (primary) hypertension: Secondary | ICD-10-CM | POA: Diagnosis not present

## 2016-01-31 DIAGNOSIS — E785 Hyperlipidemia, unspecified: Secondary | ICD-10-CM | POA: Diagnosis not present

## 2016-01-31 DIAGNOSIS — J410 Simple chronic bronchitis: Secondary | ICD-10-CM | POA: Diagnosis not present

## 2016-01-31 DIAGNOSIS — I25708 Atherosclerosis of coronary artery bypass graft(s), unspecified, with other forms of angina pectoris: Secondary | ICD-10-CM | POA: Diagnosis not present

## 2016-05-05 DIAGNOSIS — I119 Hypertensive heart disease without heart failure: Secondary | ICD-10-CM | POA: Diagnosis not present

## 2016-05-05 DIAGNOSIS — I200 Unstable angina: Secondary | ICD-10-CM | POA: Diagnosis not present

## 2016-05-05 DIAGNOSIS — I252 Old myocardial infarction: Secondary | ICD-10-CM | POA: Diagnosis not present

## 2016-05-05 DIAGNOSIS — E7800 Pure hypercholesterolemia, unspecified: Secondary | ICD-10-CM | POA: Diagnosis present

## 2016-05-05 DIAGNOSIS — I10 Essential (primary) hypertension: Secondary | ICD-10-CM | POA: Diagnosis not present

## 2016-05-05 DIAGNOSIS — Z720 Tobacco use: Secondary | ICD-10-CM | POA: Diagnosis not present

## 2016-05-05 DIAGNOSIS — I214 Non-ST elevation (NSTEMI) myocardial infarction: Secondary | ICD-10-CM | POA: Diagnosis not present

## 2016-05-05 DIAGNOSIS — R079 Chest pain, unspecified: Secondary | ICD-10-CM | POA: Diagnosis not present

## 2016-05-05 DIAGNOSIS — J449 Chronic obstructive pulmonary disease, unspecified: Secondary | ICD-10-CM | POA: Diagnosis present

## 2016-05-05 DIAGNOSIS — I2510 Atherosclerotic heart disease of native coronary artery without angina pectoris: Secondary | ICD-10-CM | POA: Diagnosis present

## 2016-05-05 DIAGNOSIS — Z951 Presence of aortocoronary bypass graft: Secondary | ICD-10-CM | POA: Diagnosis not present

## 2016-05-05 DIAGNOSIS — F17210 Nicotine dependence, cigarettes, uncomplicated: Secondary | ICD-10-CM | POA: Diagnosis present

## 2016-05-05 DIAGNOSIS — E784 Other hyperlipidemia: Secondary | ICD-10-CM | POA: Diagnosis not present

## 2016-05-05 DIAGNOSIS — Z79899 Other long term (current) drug therapy: Secondary | ICD-10-CM | POA: Diagnosis not present

## 2016-05-05 DIAGNOSIS — Z716 Tobacco abuse counseling: Secondary | ICD-10-CM | POA: Diagnosis not present

## 2016-05-06 DIAGNOSIS — Z951 Presence of aortocoronary bypass graft: Secondary | ICD-10-CM | POA: Insufficient documentation

## 2016-05-07 DIAGNOSIS — I2510 Atherosclerotic heart disease of native coronary artery without angina pectoris: Secondary | ICD-10-CM | POA: Diagnosis present

## 2016-05-07 DIAGNOSIS — I10 Essential (primary) hypertension: Secondary | ICD-10-CM | POA: Diagnosis present

## 2016-05-07 DIAGNOSIS — E785 Hyperlipidemia, unspecified: Secondary | ICD-10-CM | POA: Diagnosis present

## 2016-05-07 DIAGNOSIS — J449 Chronic obstructive pulmonary disease, unspecified: Secondary | ICD-10-CM | POA: Diagnosis present

## 2016-05-07 DIAGNOSIS — E7800 Pure hypercholesterolemia, unspecified: Secondary | ICD-10-CM | POA: Diagnosis present

## 2016-05-07 DIAGNOSIS — I25118 Atherosclerotic heart disease of native coronary artery with other forms of angina pectoris: Secondary | ICD-10-CM | POA: Diagnosis not present

## 2016-05-07 DIAGNOSIS — E669 Obesity, unspecified: Secondary | ICD-10-CM | POA: Diagnosis present

## 2016-05-07 DIAGNOSIS — Z951 Presence of aortocoronary bypass graft: Secondary | ICD-10-CM | POA: Diagnosis not present

## 2016-05-07 DIAGNOSIS — F17210 Nicotine dependence, cigarettes, uncomplicated: Secondary | ICD-10-CM | POA: Diagnosis present

## 2016-05-07 DIAGNOSIS — I214 Non-ST elevation (NSTEMI) myocardial infarction: Secondary | ICD-10-CM | POA: Diagnosis present

## 2016-05-07 DIAGNOSIS — Z8546 Personal history of malignant neoplasm of prostate: Secondary | ICD-10-CM | POA: Diagnosis not present

## 2016-05-07 HISTORY — PX: LEFT HEART CATH: CATH118248

## 2016-05-11 DIAGNOSIS — J449 Chronic obstructive pulmonary disease, unspecified: Secondary | ICD-10-CM

## 2016-05-11 DIAGNOSIS — Z79899 Other long term (current) drug therapy: Secondary | ICD-10-CM | POA: Diagnosis not present

## 2016-05-11 DIAGNOSIS — I209 Angina pectoris, unspecified: Secondary | ICD-10-CM | POA: Diagnosis not present

## 2016-05-11 DIAGNOSIS — F17210 Nicotine dependence, cigarettes, uncomplicated: Secondary | ICD-10-CM | POA: Diagnosis not present

## 2016-05-11 DIAGNOSIS — R079 Chest pain, unspecified: Secondary | ICD-10-CM | POA: Diagnosis not present

## 2016-05-11 DIAGNOSIS — I214 Non-ST elevation (NSTEMI) myocardial infarction: Secondary | ICD-10-CM | POA: Diagnosis not present

## 2016-05-11 DIAGNOSIS — Z7982 Long term (current) use of aspirin: Secondary | ICD-10-CM | POA: Diagnosis not present

## 2016-05-11 DIAGNOSIS — R1310 Dysphagia, unspecified: Secondary | ICD-10-CM | POA: Diagnosis not present

## 2016-05-11 DIAGNOSIS — R0789 Other chest pain: Secondary | ICD-10-CM | POA: Diagnosis not present

## 2016-05-11 DIAGNOSIS — E785 Hyperlipidemia, unspecified: Secondary | ICD-10-CM | POA: Diagnosis not present

## 2016-05-11 DIAGNOSIS — I119 Hypertensive heart disease without heart failure: Secondary | ICD-10-CM | POA: Diagnosis not present

## 2016-05-11 DIAGNOSIS — I2510 Atherosclerotic heart disease of native coronary artery without angina pectoris: Secondary | ICD-10-CM | POA: Diagnosis not present

## 2016-05-11 DIAGNOSIS — Z87891 Personal history of nicotine dependence: Secondary | ICD-10-CM | POA: Diagnosis not present

## 2016-05-11 DIAGNOSIS — I249 Acute ischemic heart disease, unspecified: Secondary | ICD-10-CM | POA: Diagnosis not present

## 2016-05-11 DIAGNOSIS — Z955 Presence of coronary angioplasty implant and graft: Secondary | ICD-10-CM | POA: Diagnosis not present

## 2016-05-11 DIAGNOSIS — E784 Other hyperlipidemia: Secondary | ICD-10-CM | POA: Diagnosis not present

## 2016-05-11 DIAGNOSIS — E875 Hyperkalemia: Secondary | ICD-10-CM | POA: Diagnosis not present

## 2016-05-11 DIAGNOSIS — R9431 Abnormal electrocardiogram [ECG] [EKG]: Secondary | ICD-10-CM | POA: Diagnosis not present

## 2016-05-11 DIAGNOSIS — Z951 Presence of aortocoronary bypass graft: Secondary | ICD-10-CM | POA: Diagnosis not present

## 2016-05-11 DIAGNOSIS — I10 Essential (primary) hypertension: Secondary | ICD-10-CM | POA: Diagnosis not present

## 2016-05-12 DIAGNOSIS — I25110 Atherosclerotic heart disease of native coronary artery with unstable angina pectoris: Secondary | ICD-10-CM | POA: Diagnosis present

## 2016-05-12 DIAGNOSIS — I2510 Atherosclerotic heart disease of native coronary artery without angina pectoris: Secondary | ICD-10-CM | POA: Diagnosis not present

## 2016-05-12 DIAGNOSIS — M79603 Pain in arm, unspecified: Secondary | ICD-10-CM | POA: Diagnosis not present

## 2016-05-12 DIAGNOSIS — R9431 Abnormal electrocardiogram [ECG] [EKG]: Secondary | ICD-10-CM | POA: Diagnosis not present

## 2016-05-12 DIAGNOSIS — J449 Chronic obstructive pulmonary disease, unspecified: Secondary | ICD-10-CM | POA: Diagnosis not present

## 2016-05-12 DIAGNOSIS — Z7982 Long term (current) use of aspirin: Secondary | ICD-10-CM | POA: Diagnosis not present

## 2016-05-12 DIAGNOSIS — I214 Non-ST elevation (NSTEMI) myocardial infarction: Secondary | ICD-10-CM | POA: Diagnosis not present

## 2016-05-12 DIAGNOSIS — F17210 Nicotine dependence, cigarettes, uncomplicated: Secondary | ICD-10-CM | POA: Diagnosis present

## 2016-05-12 DIAGNOSIS — E785 Hyperlipidemia, unspecified: Secondary | ICD-10-CM | POA: Diagnosis present

## 2016-05-12 DIAGNOSIS — R079 Chest pain, unspecified: Secondary | ICD-10-CM | POA: Diagnosis not present

## 2016-05-12 DIAGNOSIS — R001 Bradycardia, unspecified: Secondary | ICD-10-CM | POA: Diagnosis not present

## 2016-05-12 DIAGNOSIS — I25710 Atherosclerosis of autologous vein coronary artery bypass graft(s) with unstable angina pectoris: Secondary | ICD-10-CM | POA: Diagnosis present

## 2016-05-12 DIAGNOSIS — I25810 Atherosclerosis of coronary artery bypass graft(s) without angina pectoris: Secondary | ICD-10-CM | POA: Diagnosis not present

## 2016-05-12 DIAGNOSIS — I2582 Chronic total occlusion of coronary artery: Secondary | ICD-10-CM | POA: Diagnosis present

## 2016-05-12 DIAGNOSIS — R0602 Shortness of breath: Secondary | ICD-10-CM | POA: Diagnosis not present

## 2016-05-12 DIAGNOSIS — I10 Essential (primary) hypertension: Secondary | ICD-10-CM | POA: Diagnosis not present

## 2016-05-12 DIAGNOSIS — E784 Other hyperlipidemia: Secondary | ICD-10-CM | POA: Diagnosis not present

## 2016-05-12 DIAGNOSIS — R072 Precordial pain: Secondary | ICD-10-CM | POA: Diagnosis not present

## 2016-05-12 DIAGNOSIS — Z6828 Body mass index (BMI) 28.0-28.9, adult: Secondary | ICD-10-CM | POA: Diagnosis not present

## 2016-05-12 DIAGNOSIS — Z951 Presence of aortocoronary bypass graft: Secondary | ICD-10-CM | POA: Diagnosis not present

## 2016-05-12 DIAGNOSIS — R1310 Dysphagia, unspecified: Secondary | ICD-10-CM | POA: Diagnosis not present

## 2016-05-12 DIAGNOSIS — E875 Hyperkalemia: Secondary | ICD-10-CM | POA: Diagnosis not present

## 2016-05-12 DIAGNOSIS — I119 Hypertensive heart disease without heart failure: Secondary | ICD-10-CM | POA: Diagnosis not present

## 2016-05-12 DIAGNOSIS — Z955 Presence of coronary angioplasty implant and graft: Secondary | ICD-10-CM | POA: Diagnosis not present

## 2016-05-12 DIAGNOSIS — F17211 Nicotine dependence, cigarettes, in remission: Secondary | ICD-10-CM | POA: Diagnosis not present

## 2016-05-12 DIAGNOSIS — I200 Unstable angina: Secondary | ICD-10-CM | POA: Diagnosis not present

## 2016-05-14 HISTORY — PX: OTHER SURGICAL HISTORY: SHX169

## 2016-05-28 DIAGNOSIS — Z951 Presence of aortocoronary bypass graft: Secondary | ICD-10-CM | POA: Diagnosis not present

## 2016-05-28 DIAGNOSIS — E7800 Pure hypercholesterolemia, unspecified: Secondary | ICD-10-CM | POA: Diagnosis not present

## 2016-05-28 DIAGNOSIS — Z87891 Personal history of nicotine dependence: Secondary | ICD-10-CM | POA: Diagnosis not present

## 2016-05-28 DIAGNOSIS — I2510 Atherosclerotic heart disease of native coronary artery without angina pectoris: Secondary | ICD-10-CM | POA: Diagnosis not present

## 2016-05-28 DIAGNOSIS — E782 Mixed hyperlipidemia: Secondary | ICD-10-CM | POA: Diagnosis not present

## 2016-05-29 DIAGNOSIS — E782 Mixed hyperlipidemia: Secondary | ICD-10-CM | POA: Insufficient documentation

## 2016-05-29 DIAGNOSIS — Z87891 Personal history of nicotine dependence: Secondary | ICD-10-CM | POA: Insufficient documentation

## 2016-06-06 DIAGNOSIS — I2510 Atherosclerotic heart disease of native coronary artery without angina pectoris: Secondary | ICD-10-CM | POA: Diagnosis not present

## 2016-06-08 DIAGNOSIS — R0602 Shortness of breath: Secondary | ICD-10-CM | POA: Diagnosis not present

## 2016-06-08 DIAGNOSIS — E782 Mixed hyperlipidemia: Secondary | ICD-10-CM | POA: Diagnosis not present

## 2016-06-08 DIAGNOSIS — I10 Essential (primary) hypertension: Secondary | ICD-10-CM | POA: Diagnosis not present

## 2016-06-08 DIAGNOSIS — R200 Anesthesia of skin: Secondary | ICD-10-CM | POA: Diagnosis not present

## 2016-06-08 DIAGNOSIS — R072 Precordial pain: Secondary | ICD-10-CM | POA: Diagnosis not present

## 2016-06-08 DIAGNOSIS — Z7982 Long term (current) use of aspirin: Secondary | ICD-10-CM | POA: Diagnosis not present

## 2016-06-08 DIAGNOSIS — Z79899 Other long term (current) drug therapy: Secondary | ICD-10-CM | POA: Diagnosis not present

## 2016-06-08 DIAGNOSIS — R931 Abnormal findings on diagnostic imaging of heart and coronary circulation: Secondary | ICD-10-CM | POA: Diagnosis not present

## 2016-06-08 DIAGNOSIS — D649 Anemia, unspecified: Secondary | ICD-10-CM | POA: Diagnosis not present

## 2016-06-08 DIAGNOSIS — R0789 Other chest pain: Secondary | ICD-10-CM | POA: Diagnosis not present

## 2016-06-08 DIAGNOSIS — I5022 Chronic systolic (congestive) heart failure: Secondary | ICD-10-CM | POA: Diagnosis not present

## 2016-06-08 DIAGNOSIS — Z955 Presence of coronary angioplasty implant and graft: Secondary | ICD-10-CM | POA: Diagnosis not present

## 2016-06-08 DIAGNOSIS — I110 Hypertensive heart disease with heart failure: Secondary | ICD-10-CM | POA: Diagnosis not present

## 2016-06-08 DIAGNOSIS — I252 Old myocardial infarction: Secondary | ICD-10-CM | POA: Diagnosis not present

## 2016-06-08 DIAGNOSIS — R079 Chest pain, unspecified: Secondary | ICD-10-CM | POA: Diagnosis not present

## 2016-06-08 DIAGNOSIS — I25810 Atherosclerosis of coronary artery bypass graft(s) without angina pectoris: Secondary | ICD-10-CM | POA: Diagnosis not present

## 2016-06-08 DIAGNOSIS — Z87891 Personal history of nicotine dependence: Secondary | ICD-10-CM | POA: Diagnosis not present

## 2016-06-08 DIAGNOSIS — I25119 Atherosclerotic heart disease of native coronary artery with unspecified angina pectoris: Secondary | ICD-10-CM | POA: Diagnosis not present

## 2016-06-08 DIAGNOSIS — I25708 Atherosclerosis of coronary artery bypass graft(s), unspecified, with other forms of angina pectoris: Secondary | ICD-10-CM | POA: Insufficient documentation

## 2016-06-08 DIAGNOSIS — R61 Generalized hyperhidrosis: Secondary | ICD-10-CM | POA: Diagnosis not present

## 2016-06-09 DIAGNOSIS — D649 Anemia, unspecified: Secondary | ICD-10-CM | POA: Diagnosis not present

## 2016-06-09 DIAGNOSIS — I25810 Atherosclerosis of coronary artery bypass graft(s) without angina pectoris: Secondary | ICD-10-CM | POA: Diagnosis not present

## 2016-06-09 DIAGNOSIS — E784 Other hyperlipidemia: Secondary | ICD-10-CM | POA: Diagnosis not present

## 2016-06-09 DIAGNOSIS — I209 Angina pectoris, unspecified: Secondary | ICD-10-CM | POA: Diagnosis not present

## 2016-06-09 DIAGNOSIS — R200 Anesthesia of skin: Secondary | ICD-10-CM | POA: Diagnosis not present

## 2016-06-09 DIAGNOSIS — I119 Hypertensive heart disease without heart failure: Secondary | ICD-10-CM | POA: Diagnosis not present

## 2016-06-09 DIAGNOSIS — I2510 Atherosclerotic heart disease of native coronary artery without angina pectoris: Secondary | ICD-10-CM | POA: Diagnosis not present

## 2016-06-09 DIAGNOSIS — Z955 Presence of coronary angioplasty implant and graft: Secondary | ICD-10-CM | POA: Diagnosis not present

## 2016-06-09 DIAGNOSIS — I517 Cardiomegaly: Secondary | ICD-10-CM | POA: Diagnosis not present

## 2016-06-09 DIAGNOSIS — Z951 Presence of aortocoronary bypass graft: Secondary | ICD-10-CM | POA: Diagnosis not present

## 2016-06-09 DIAGNOSIS — E782 Mixed hyperlipidemia: Secondary | ICD-10-CM | POA: Diagnosis not present

## 2016-06-09 DIAGNOSIS — I10 Essential (primary) hypertension: Secondary | ICD-10-CM | POA: Diagnosis not present

## 2016-06-09 DIAGNOSIS — R079 Chest pain, unspecified: Secondary | ICD-10-CM | POA: Diagnosis not present

## 2016-06-09 DIAGNOSIS — R0789 Other chest pain: Secondary | ICD-10-CM | POA: Diagnosis not present

## 2016-06-09 DIAGNOSIS — F17211 Nicotine dependence, cigarettes, in remission: Secondary | ICD-10-CM | POA: Diagnosis not present

## 2016-06-09 DIAGNOSIS — I252 Old myocardial infarction: Secondary | ICD-10-CM | POA: Diagnosis not present

## 2016-06-10 DIAGNOSIS — I10 Essential (primary) hypertension: Secondary | ICD-10-CM | POA: Diagnosis not present

## 2016-06-10 DIAGNOSIS — E782 Mixed hyperlipidemia: Secondary | ICD-10-CM | POA: Diagnosis not present

## 2016-06-10 DIAGNOSIS — D649 Anemia, unspecified: Secondary | ICD-10-CM | POA: Diagnosis not present

## 2016-06-10 DIAGNOSIS — E784 Other hyperlipidemia: Secondary | ICD-10-CM | POA: Diagnosis not present

## 2016-06-10 DIAGNOSIS — I209 Angina pectoris, unspecified: Secondary | ICD-10-CM | POA: Diagnosis not present

## 2016-06-10 DIAGNOSIS — R0789 Other chest pain: Secondary | ICD-10-CM | POA: Diagnosis not present

## 2016-06-10 DIAGNOSIS — I25810 Atherosclerosis of coronary artery bypass graft(s) without angina pectoris: Secondary | ICD-10-CM | POA: Diagnosis not present

## 2016-06-10 DIAGNOSIS — Z955 Presence of coronary angioplasty implant and graft: Secondary | ICD-10-CM | POA: Diagnosis not present

## 2016-06-10 DIAGNOSIS — I2510 Atherosclerotic heart disease of native coronary artery without angina pectoris: Secondary | ICD-10-CM | POA: Diagnosis not present

## 2016-06-10 DIAGNOSIS — I119 Hypertensive heart disease without heart failure: Secondary | ICD-10-CM | POA: Diagnosis not present

## 2016-06-11 DIAGNOSIS — E782 Mixed hyperlipidemia: Secondary | ICD-10-CM | POA: Diagnosis not present

## 2016-06-11 DIAGNOSIS — D649 Anemia, unspecified: Secondary | ICD-10-CM | POA: Diagnosis not present

## 2016-06-11 DIAGNOSIS — I209 Angina pectoris, unspecified: Secondary | ICD-10-CM | POA: Diagnosis not present

## 2016-06-11 DIAGNOSIS — I10 Essential (primary) hypertension: Secondary | ICD-10-CM | POA: Diagnosis not present

## 2016-06-11 DIAGNOSIS — I25810 Atherosclerosis of coronary artery bypass graft(s) without angina pectoris: Secondary | ICD-10-CM | POA: Diagnosis not present

## 2016-06-11 DIAGNOSIS — Z951 Presence of aortocoronary bypass graft: Secondary | ICD-10-CM | POA: Diagnosis not present

## 2016-06-11 DIAGNOSIS — R079 Chest pain, unspecified: Secondary | ICD-10-CM | POA: Diagnosis not present

## 2016-06-11 DIAGNOSIS — I2510 Atherosclerotic heart disease of native coronary artery without angina pectoris: Secondary | ICD-10-CM | POA: Diagnosis not present

## 2016-06-12 DIAGNOSIS — Z951 Presence of aortocoronary bypass graft: Secondary | ICD-10-CM | POA: Diagnosis not present

## 2016-06-12 DIAGNOSIS — I10 Essential (primary) hypertension: Secondary | ICD-10-CM | POA: Diagnosis not present

## 2016-06-12 DIAGNOSIS — I4729 Other ventricular tachycardia: Secondary | ICD-10-CM | POA: Insufficient documentation

## 2016-06-12 DIAGNOSIS — Z87891 Personal history of nicotine dependence: Secondary | ICD-10-CM | POA: Diagnosis not present

## 2016-06-12 DIAGNOSIS — I472 Ventricular tachycardia: Secondary | ICD-10-CM | POA: Insufficient documentation

## 2016-06-12 DIAGNOSIS — I25700 Atherosclerosis of coronary artery bypass graft(s), unspecified, with unstable angina pectoris: Secondary | ICD-10-CM | POA: Diagnosis not present

## 2016-06-12 DIAGNOSIS — E782 Mixed hyperlipidemia: Secondary | ICD-10-CM | POA: Diagnosis not present

## 2016-06-15 DIAGNOSIS — Z951 Presence of aortocoronary bypass graft: Secondary | ICD-10-CM | POA: Diagnosis not present

## 2016-06-15 DIAGNOSIS — Z87891 Personal history of nicotine dependence: Secondary | ICD-10-CM | POA: Diagnosis not present

## 2016-06-15 DIAGNOSIS — I214 Non-ST elevation (NSTEMI) myocardial infarction: Secondary | ICD-10-CM | POA: Diagnosis not present

## 2016-06-15 DIAGNOSIS — E7800 Pure hypercholesterolemia, unspecified: Secondary | ICD-10-CM | POA: Diagnosis not present

## 2016-06-15 DIAGNOSIS — I25708 Atherosclerosis of coronary artery bypass graft(s), unspecified, with other forms of angina pectoris: Secondary | ICD-10-CM | POA: Diagnosis not present

## 2016-06-15 DIAGNOSIS — I10 Essential (primary) hypertension: Secondary | ICD-10-CM | POA: Diagnosis not present

## 2016-06-18 DIAGNOSIS — Z7982 Long term (current) use of aspirin: Secondary | ICD-10-CM | POA: Diagnosis not present

## 2016-06-18 DIAGNOSIS — Z87891 Personal history of nicotine dependence: Secondary | ICD-10-CM | POA: Diagnosis not present

## 2016-06-18 DIAGNOSIS — I472 Ventricular tachycardia: Secondary | ICD-10-CM | POA: Diagnosis not present

## 2016-06-18 DIAGNOSIS — I252 Old myocardial infarction: Secondary | ICD-10-CM | POA: Diagnosis not present

## 2016-06-18 DIAGNOSIS — I2510 Atherosclerotic heart disease of native coronary artery without angina pectoris: Secondary | ICD-10-CM | POA: Diagnosis not present

## 2016-06-18 DIAGNOSIS — Z951 Presence of aortocoronary bypass graft: Secondary | ICD-10-CM | POA: Diagnosis not present

## 2016-07-15 DIAGNOSIS — S46002A Unspecified injury of muscle(s) and tendon(s) of the rotator cuff of left shoulder, initial encounter: Secondary | ICD-10-CM | POA: Diagnosis not present

## 2016-07-18 DIAGNOSIS — K591 Functional diarrhea: Secondary | ICD-10-CM | POA: Diagnosis not present

## 2016-07-18 DIAGNOSIS — R05 Cough: Secondary | ICD-10-CM | POA: Diagnosis not present

## 2016-10-27 DIAGNOSIS — R2681 Unsteadiness on feet: Secondary | ICD-10-CM | POA: Diagnosis not present

## 2016-10-27 DIAGNOSIS — R531 Weakness: Secondary | ICD-10-CM | POA: Diagnosis not present

## 2016-10-27 DIAGNOSIS — M5412 Radiculopathy, cervical region: Secondary | ICD-10-CM | POA: Diagnosis not present

## 2016-10-27 DIAGNOSIS — R42 Dizziness and giddiness: Secondary | ICD-10-CM | POA: Diagnosis not present

## 2016-10-27 DIAGNOSIS — E878 Other disorders of electrolyte and fluid balance, not elsewhere classified: Secondary | ICD-10-CM | POA: Diagnosis not present

## 2016-11-23 DIAGNOSIS — Z8546 Personal history of malignant neoplasm of prostate: Secondary | ICD-10-CM | POA: Diagnosis not present

## 2016-11-23 DIAGNOSIS — R079 Chest pain, unspecified: Secondary | ICD-10-CM | POA: Diagnosis not present

## 2016-11-23 DIAGNOSIS — Z125 Encounter for screening for malignant neoplasm of prostate: Secondary | ICD-10-CM | POA: Diagnosis not present

## 2016-11-23 DIAGNOSIS — M25512 Pain in left shoulder: Secondary | ICD-10-CM | POA: Diagnosis not present

## 2016-11-23 DIAGNOSIS — I2510 Atherosclerotic heart disease of native coronary artery without angina pectoris: Secondary | ICD-10-CM | POA: Diagnosis not present

## 2016-12-03 DIAGNOSIS — M25512 Pain in left shoulder: Secondary | ICD-10-CM | POA: Diagnosis not present

## 2016-12-08 DIAGNOSIS — M19012 Primary osteoarthritis, left shoulder: Secondary | ICD-10-CM | POA: Diagnosis not present

## 2016-12-08 DIAGNOSIS — M7552 Bursitis of left shoulder: Secondary | ICD-10-CM | POA: Diagnosis not present

## 2016-12-10 ENCOUNTER — Encounter: Payer: Self-pay | Admitting: *Deleted

## 2016-12-17 DIAGNOSIS — M25512 Pain in left shoulder: Secondary | ICD-10-CM | POA: Diagnosis not present

## 2016-12-17 DIAGNOSIS — M7542 Impingement syndrome of left shoulder: Secondary | ICD-10-CM | POA: Diagnosis not present

## 2016-12-25 DIAGNOSIS — I2510 Atherosclerotic heart disease of native coronary artery without angina pectoris: Secondary | ICD-10-CM | POA: Insufficient documentation

## 2016-12-25 DIAGNOSIS — E785 Hyperlipidemia, unspecified: Secondary | ICD-10-CM | POA: Insufficient documentation

## 2016-12-25 DIAGNOSIS — K219 Gastro-esophageal reflux disease without esophagitis: Secondary | ICD-10-CM | POA: Insufficient documentation

## 2016-12-25 DIAGNOSIS — I10 Essential (primary) hypertension: Secondary | ICD-10-CM | POA: Insufficient documentation

## 2016-12-25 DIAGNOSIS — J449 Chronic obstructive pulmonary disease, unspecified: Secondary | ICD-10-CM | POA: Insufficient documentation

## 2016-12-25 NOTE — Progress Notes (Signed)
Cardiology Office Note   Date:  12/26/2016   ID:  [name], DOB 05/08/1951, MRN [id number]  PCP:  [name], MD    No chief complaint on file.  F/u CAD  Wt Readings from Last 3 Encounters:  12/26/16 223 lb 1.9 oz (101.2 kg)       History of Present Illness: [name] is a 66 y.o. male  Who had CABG x 3 in approximately 2008 in [place].  At the time, he had a chest tightness and pressure.    He had his sternal rings removed in about 2009.  In 2017 October, her had a NSTEMI and a DES to the first diagonal, resolute 2.25 x 22 mm.  Done at [facility].  LIMA to OM was patent.  SVG to diag patent.  Ostial 50% LAD lesion remains.  Unclear where the third graft.  RIMA mentioned but not in cath report.    He has some chest discomfort when he walks around. It is improved with rest and NTG.  Pain can radiate to his neck.  These sx started about 3 months ago, Feb 2018.  Sx have been getting worse.  It is occurring most times he walks.   CP associated with dizziness and SHOB.  Cath was done through the left wrist in 2017.  He stopped his Brilinta a month after he started.   He has not tried Imdur in the past. BP is usually high.  Denies : Leg edema.  Orthopnea. Palpitations. Paroxysmal nocturnal dyspnea. Syncope.   He is transferring care from [facility] to [place].    He has had CP this AM and used NTG.       Past Medical History:  Diagnosis Date  . Coronary artery disease    RAF-HCC  . MI (myocardial infarction) [facility])     Past Surgical History:  Procedure Laterality Date  . LEFT HEART CATH Left 05/07/2016   PR CATH PLACE/CORON ANGIO, IMG SUPER/TERP, W  [facility] CATH; SEVICE: CARDIOLOGY  . LEFT HEART VENTRICULOGRAPHY Left 05/14/2016   PR CATH PLACE/ CORON ANGIO, IMG SUPER/ INTERP, W      Current Outpatient Prescriptions  Medication Sig Dispense Refill  . albuterol (PROVENTIL HFA;VENTOLIN HFA) 108 (90 Base) MCG/ACT inhaler Inhale 2 puffs into the lungs  every 6 (six) hours as needed. AS NEEDED FOR WHEEZING    . aspirin 81 MG chewable tablet Chew 325 mg by mouth daily.     . budesonide-formoterol (SYMBICORT) 80-4.5 MCG/ACT inhaler Inhale 2 puffs into the lungs 2 (two) times daily as needed.    . metoprolol tartrate (LOPRESSOR) 25 MG tablet Take 25 mg by mouth 2 (two) times daily.     . nitroGLYCERIN (NITROSTAT) 0.4 MG SL tablet Place 0.4 mg under the tongue as needed for chest pain. X 3 doses     No current facility-administered medications for this visit.     Allergies:   Clopidogrel; Codeine; Hydrocodone-acetaminophen; Simvastatin; and Influenza vaccines    Social History:  The patient  reports that he quit smoking about 7 months ago. He has never used smokeless tobacco. He reports that he uses drugs. He reports that he does not drink alcohol.   Family History:  The patient's family history includes Hypertension in his brother, brother, and sister; Kidney failure in his mother.    ROS:  Please see the history of present illness.   Otherwise, review of systems are positive for CP, increased BP.  All other systems are reviewed and negative.    PHYSICAL EXAM: VS:  BP (!) 164/90 (BP Location: Right Arm, Patient Position: Sitting, Cuff Size: Large)   Pulse (!) 54   Ht 5\' 11"  (1.803 m)   Wt 223 lb 1.9 oz (101.2 kg)   SpO2 99%   BMI 31.12 kg/m  , BMI Body mass index is 31.12 kg/m. GEN: Well nourished, well developed, in no acute distress  HEENT: normal  Neck: no JVD, carotid bruits, or masses Cardiac: RRR; no murmurs, rubs, or gallops,no edema  Respiratory:  clear to auscultation bilaterally, normal work of breathing GI: soft, nontender, nondistended, + BS MS: no deformity or atrophy  Skin: warm and dry, no rash Neuro:  Strength and sensation are intact Psych: euthymic mood, full affect   EKG:   The ekg ordered today demonstrates sinus bradycardia, no ST changes   Recent Labs: No results found for requested labs within last  8760 hours.   Lipid Panel    Component Value Date/Time   CHOL  04/15/2008 0425    177        ATP III CLASSIFICATION:  <200     mg/dL   Desirable  200-239  mg/dL   Borderline High  >=240    mg/dL   High   TRIG 63 04/15/2008 0425   HDL 32 (L) 04/15/2008 0425   CHOLHDL 5.5 04/15/2008 0425   VLDL 13 04/15/2008 0425   LDLCALC (H) 04/15/2008 0425    132        Total Cholesterol/HDL:CHD Risk Coronary Heart Disease Risk Table                     Men   Women  1/2 Average Risk   3.4   3.3     Other studies Reviewed: Additional studies/ records that were reviewed today with results demonstrating: 2017 cath report reviewed.   ASSESSMENT AND PLAN:  1. CAD:  Sx concerning for angina.  Will give 180 mg Brilinta today in the office and will need Clopidogrel 75 mg daily, longterm.  Will admit to the hospital with plans for cardiac cath.  I have spoken to the interventional cardiology team and they will manage the patient. Initially, we plan to send him by private vehicle but he became dizzy in the office and nearly fell. Therefore, we are sending him by EMS to the hospital.  After calling EMS,   I spoke to his wife who states that he has been "staggering for weeks.".  RIMA graft listed, but patient states that last cath was done from the wrist.  ? Atretic RIMA to LAD. 2. Intolerant of statins:  He has tried lipitor, Crestor, Zocor, pravastatin in the past and became" sick as a dog."  Refer lipid clinic to see about PCSK9. 3. He needs to stop smoking.  [place], given to help.  4. Hypertension: Blood pressure is been high. He will likely need an ACE inhibitor if his renal function will tolerate.   Current medicines are reviewed at length with the patient today.  The patient concerns regarding his medicines were addressed.  The following changes have been made:  No change  Labs/ tests ordered today include:  No orders of the defined types were placed in this encounter.   Recommend 150  minutes/week of aerobic exercise Low fat, low carb, high fiber diet recommended  Disposition:   FU based on hospital results   Signed, Larae Grooms, MD  12/26/2016  10:00 AM    [facility] [place], [place], [place]  [address] Phone: [phone]; Fax: [phone]

## 2016-12-26 ENCOUNTER — Encounter (HOSPITAL_COMMUNITY): Payer: Self-pay

## 2016-12-26 ENCOUNTER — Inpatient Hospital Stay (HOSPITAL_COMMUNITY)
Admission: AD | Disposition: A | Discharge status: Home / Self Care | Payer: Self-pay | Source: Ambulatory Visit | Attending: Interventional Cardiology

## 2016-12-26 ENCOUNTER — Encounter (INDEPENDENT_AMBULATORY_CARE_PROVIDER_SITE_OTHER): Payer: Self-pay

## 2016-12-26 ENCOUNTER — Ambulatory Visit (HOSPITAL_COMMUNITY): Payer: Medicare Other | Admitting: Interventional Cardiology

## 2016-12-26 ENCOUNTER — Encounter: Payer: Self-pay | Admitting: Interventional Cardiology

## 2016-12-26 ENCOUNTER — Inpatient Hospital Stay (HOSPITAL_COMMUNITY)
Admission: AD | Admit: 2016-12-26 | Discharge: 2016-12-29 | DRG: 247 | Disposition: A | Discharge status: Home / Self Care | Payer: Medicare Other | Source: Ambulatory Visit | Attending: Interventional Cardiology | Admitting: Interventional Cardiology

## 2016-12-26 VITALS — BP 164/90 | HR 54 | Ht 71.0 in | Wt 223.1 lb

## 2016-12-26 DIAGNOSIS — Z7982 Long term (current) use of aspirin: Secondary | ICD-10-CM | POA: Diagnosis not present

## 2016-12-26 DIAGNOSIS — F17210 Nicotine dependence, cigarettes, uncomplicated: Secondary | ICD-10-CM | POA: Diagnosis present

## 2016-12-26 DIAGNOSIS — Z87891 Personal history of nicotine dependence: Secondary | ICD-10-CM | POA: Diagnosis not present

## 2016-12-26 DIAGNOSIS — I252 Old myocardial infarction: Secondary | ICD-10-CM

## 2016-12-26 DIAGNOSIS — J449 Chronic obstructive pulmonary disease, unspecified: Secondary | ICD-10-CM | POA: Diagnosis present

## 2016-12-26 DIAGNOSIS — T45526A Underdosing of antithrombotic drugs, initial encounter: Secondary | ICD-10-CM | POA: Diagnosis present

## 2016-12-26 DIAGNOSIS — Z91120 Patient's intentional underdosing of medication regimen due to financial hardship: Secondary | ICD-10-CM | POA: Diagnosis not present

## 2016-12-26 DIAGNOSIS — R079 Chest pain, unspecified: Secondary | ICD-10-CM | POA: Insufficient documentation

## 2016-12-26 DIAGNOSIS — Z888 Allergy status to other drugs, medicaments and biological substances status: Secondary | ICD-10-CM

## 2016-12-26 DIAGNOSIS — I10 Essential (primary) hypertension: Secondary | ICD-10-CM

## 2016-12-26 DIAGNOSIS — Z79899 Other long term (current) drug therapy: Secondary | ICD-10-CM

## 2016-12-26 DIAGNOSIS — Z7951 Long term (current) use of inhaled steroids: Secondary | ICD-10-CM | POA: Diagnosis not present

## 2016-12-26 DIAGNOSIS — E782 Mixed hyperlipidemia: Secondary | ICD-10-CM

## 2016-12-26 DIAGNOSIS — I2510 Atherosclerotic heart disease of native coronary artery without angina pectoris: Secondary | ICD-10-CM | POA: Diagnosis present

## 2016-12-26 DIAGNOSIS — I200 Unstable angina: Secondary | ICD-10-CM | POA: Diagnosis not present

## 2016-12-26 DIAGNOSIS — I25110 Atherosclerotic heart disease of native coronary artery with unstable angina pectoris: Secondary | ICD-10-CM | POA: Diagnosis not present

## 2016-12-26 DIAGNOSIS — Z720 Tobacco use: Secondary | ICD-10-CM

## 2016-12-26 DIAGNOSIS — R55 Syncope and collapse: Secondary | ICD-10-CM | POA: Diagnosis not present

## 2016-12-26 DIAGNOSIS — E785 Hyperlipidemia, unspecified: Secondary | ICD-10-CM | POA: Diagnosis present

## 2016-12-26 DIAGNOSIS — R404 Transient alteration of awareness: Secondary | ICD-10-CM | POA: Diagnosis not present

## 2016-12-26 DIAGNOSIS — Z951 Presence of aortocoronary bypass graft: Secondary | ICD-10-CM | POA: Diagnosis not present

## 2016-12-26 DIAGNOSIS — Z955 Presence of coronary angioplasty implant and graft: Secondary | ICD-10-CM

## 2016-12-26 DIAGNOSIS — I739 Peripheral vascular disease, unspecified: Secondary | ICD-10-CM | POA: Diagnosis not present

## 2016-12-26 DIAGNOSIS — I25721 Atherosclerosis of autologous artery coronary artery bypass graft(s) with angina pectoris with documented spasm: Secondary | ICD-10-CM | POA: Diagnosis present

## 2016-12-26 DIAGNOSIS — Z8249 Family history of ischemic heart disease and other diseases of the circulatory system: Secondary | ICD-10-CM

## 2016-12-26 DIAGNOSIS — I340 Nonrheumatic mitral (valve) insufficiency: Secondary | ICD-10-CM | POA: Diagnosis present

## 2016-12-26 HISTORY — DX: Chronic obstructive pulmonary disease, unspecified: J44.9

## 2016-12-26 HISTORY — PX: LEFT HEART CATH AND CORS/GRAFTS ANGIOGRAPHY: CATH118250

## 2016-12-26 HISTORY — DX: Hyperlipidemia, unspecified: E78.5

## 2016-12-26 HISTORY — DX: Gastro-esophageal reflux disease without esophagitis: K21.9

## 2016-12-26 HISTORY — DX: Tobacco use: Z72.0

## 2016-12-26 HISTORY — DX: Essential (primary) hypertension: I10

## 2016-12-26 HISTORY — DX: Malignant (primary) neoplasm, unspecified: C80.1

## 2016-12-26 HISTORY — PX: ABDOMINAL AORTOGRAM: CATH118222

## 2016-12-26 LAB — CBC WITH DIFFERENTIAL/PLATELET
Basophils Absolute: 0 10*3/uL (ref 0.0–0.1)
Basophils Relative: 0 %
Eosinophils Absolute: 0.1 10*3/uL (ref 0.0–0.7)
Eosinophils Relative: 1 %
HEMATOCRIT: 43.8 % (ref 39.0–52.0)
HEMOGLOBIN: 15.1 g/dL (ref 13.0–17.0)
LYMPHS ABS: 2.2 10*3/uL (ref 0.7–4.0)
Lymphocytes Relative: 21 %
MCH: 31.2 pg (ref 26.0–34.0)
MCHC: 34.5 g/dL (ref 30.0–36.0)
MCV: 90.5 fL (ref 78.0–100.0)
MONOS PCT: 9 %
Monocytes Absolute: 1 10*3/uL (ref 0.1–1.0)
NEUTROS ABS: 7.4 10*3/uL (ref 1.7–7.7)
NEUTROS PCT: 69 %
Platelets: 238 10*3/uL (ref 150–400)
RBC: 4.84 MIL/uL (ref 4.22–5.81)
RDW: 13.5 % (ref 11.5–15.5)
WBC: 10.7 10*3/uL — AB (ref 4.0–10.5)

## 2016-12-26 LAB — COMPREHENSIVE METABOLIC PANEL
ALBUMIN: 3.7 g/dL (ref 3.5–5.0)
ALT: 16 U/L — AB (ref 17–63)
AST: 15 U/L (ref 15–41)
Alkaline Phosphatase: 92 U/L (ref 38–126)
Anion gap: 8 (ref 5–15)
BILIRUBIN TOTAL: 0.4 mg/dL (ref 0.3–1.2)
BUN: 11 mg/dL (ref 6–20)
CHLORIDE: 105 mmol/L (ref 101–111)
CO2: 25 mmol/L (ref 22–32)
CREATININE: 1.1 mg/dL (ref 0.61–1.24)
Calcium: 9 mg/dL (ref 8.9–10.3)
GFR calc Af Amer: >60 mL/min (ref >60)
GLUCOSE: 88 mg/dL (ref 65–99)
Potassium: 4.5 mmol/L (ref 3.5–5.1)
Sodium: 138 mmol/L (ref 135–145)
Total Protein: 6.9 g/dL (ref 6.5–8.1)

## 2016-12-26 LAB — TROPONIN I
Troponin I: <0.03 ng/mL (ref <0.03)
Troponin I: <0.03 ng/mL (ref <0.03)

## 2016-12-26 LAB — POCT ACTIVATED CLOTTING TIME: Activated Clotting Time: 109 seconds

## 2016-12-26 LAB — HEPARIN LEVEL (UNFRACTIONATED): Heparin Unfractionated: <0.1 IU/mL — ABNORMAL LOW (ref 0.30–0.70)

## 2016-12-26 LAB — PROTIME-INR
INR: 0.92
Prothrombin Time: 12.3 seconds (ref 11.4–15.2)

## 2016-12-26 SURGERY — LEFT HEART CATH AND CORS/GRAFTS ANGIOGRAPHY
Anesthesia: LOCAL

## 2016-12-26 MED ORDER — NITROGLYCERIN 1 MG/10 ML FOR IR/CATH LAB
INTRA_ARTERIAL | Status: AC
  Filled 2016-12-26: qty 10

## 2016-12-26 MED ORDER — ISOSORBIDE MONONITRATE ER 60 MG PO TB24
60.0000 mg | ORAL_TABLET | Freq: Every day | ORAL | Status: DC
  Administered 2016-12-27: 60 mg via ORAL
  Filled 2016-12-26: qty 1

## 2016-12-26 MED ORDER — SODIUM CHLORIDE 0.9 % IV SOLN
INTRAVENOUS | Status: DC
  Administered 2016-12-26: 19:00:00 via INTRAVENOUS

## 2016-12-26 MED ORDER — ISOSORBIDE MONONITRATE ER 30 MG PO TB24: 30.0000 mg | ORAL_TABLET | Freq: Every day | ORAL | Status: DC

## 2016-12-26 MED ORDER — PROMETHAZINE HCL 25 MG/ML IJ SOLN
12.5000 mg | Freq: Four times a day (QID) | INTRAMUSCULAR | Status: DC | PRN
  Administered 2016-12-26 – 2016-12-27 (×2): 12.5 mg via INTRAVENOUS
  Filled 2016-12-26 (×2): qty 1

## 2016-12-26 MED ORDER — MIDAZOLAM HCL 2 MG/2ML IJ SOLN
INTRAMUSCULAR | Status: AC
  Filled 2016-12-26: qty 2

## 2016-12-26 MED ORDER — SODIUM CHLORIDE 0.9 % WEIGHT BASED INFUSION
1.0000 mL/kg/h | INTRAVENOUS | Status: DC
Start: 2016-12-26 — End: 2016-12-26

## 2016-12-26 MED ORDER — SODIUM CHLORIDE 0.9% FLUSH
3.0000 mL | INTRAVENOUS | Status: DC | PRN
Start: 2016-12-26 — End: 2016-12-29

## 2016-12-26 MED ORDER — HEPARIN (PORCINE) IN NACL 100-0.45 UNIT/ML-% IJ SOLN
1350.0000 [IU]/h | INTRAMUSCULAR | Status: DC
  Administered 2016-12-26: 1350 [IU]/h via INTRAVENOUS
  Filled 2016-12-26: qty 250

## 2016-12-26 MED ORDER — VERAPAMIL HCL 2.5 MG/ML IV SOLN
INTRAVENOUS | Status: AC
Start: 2016-12-26 — End: 2016-12-26
  Filled 2016-12-26: qty 2

## 2016-12-26 MED ORDER — ISOSORBIDE MONONITRATE ER 30 MG PO TB24: 30.0000 mg | ORAL_TABLET | Freq: Once | ORAL | Status: DC

## 2016-12-26 MED ORDER — SODIUM CHLORIDE 0.9 % WEIGHT BASED INFUSION: 1.0000 mL/kg/h | INTRAVENOUS | Status: DC

## 2016-12-26 MED ORDER — IOPAMIDOL (ISOVUE-370) INJECTION 76%
INTRAVENOUS | Status: AC
  Filled 2016-12-26: qty 125

## 2016-12-26 MED ORDER — HEPARIN (PORCINE) IN NACL 2-0.9 UNIT/ML-% IJ SOLN
INTRAMUSCULAR | Status: AC | PRN
  Administered 2016-12-26: 1000 mL

## 2016-12-26 MED ORDER — SODIUM CHLORIDE 0.9% FLUSH
3.0000 mL | Freq: Two times a day (BID) | INTRAVENOUS | Status: DC
  Administered 2016-12-26 – 2016-12-28 (×4): 3 mL via INTRAVENOUS

## 2016-12-26 MED ORDER — IOPAMIDOL (ISOVUE-370) INJECTION 76%
INTRAVENOUS | Status: AC
  Filled 2016-12-26: qty 50

## 2016-12-26 MED ORDER — ASPIRIN 81 MG PO CHEW
81.0000 mg | CHEWABLE_TABLET | Freq: Every day | ORAL | Status: DC
  Administered 2016-12-27 – 2016-12-29 (×2): 81 mg via ORAL
  Filled 2016-12-26 (×3): qty 1

## 2016-12-26 MED ORDER — NITROGLYCERIN IN D5W 200-5 MCG/ML-% IV SOLN
0.0000 ug/min | INTRAVENOUS | Status: DC
  Administered 2016-12-26: 5 ug/min via INTRAVENOUS
  Filled 2016-12-26: qty 250

## 2016-12-26 MED ORDER — ONDANSETRON HCL 4 MG/2ML IJ SOLN: 4.0000 mg | Freq: Four times a day (QID) | INTRAMUSCULAR | Status: DC | PRN

## 2016-12-26 MED ORDER — LIDOCAINE HCL 1 % IJ SOLN
INTRAMUSCULAR | Status: AC
  Filled 2016-12-26: qty 20

## 2016-12-26 MED ORDER — SODIUM CHLORIDE 0.9 % WEIGHT BASED INFUSION
3.0000 mL/kg/h | INTRAVENOUS | Status: DC
Start: 2016-12-26 — End: 2016-12-26
  Administered 2016-12-26: 3 mL/kg/h via INTRAVENOUS

## 2016-12-26 MED ORDER — ASPIRIN EC 81 MG PO TBEC: 81.0000 mg | DELAYED_RELEASE_TABLET | Freq: Every day | ORAL | Status: DC

## 2016-12-26 MED ORDER — SODIUM CHLORIDE 0.9 % WEIGHT BASED INFUSION: 3.0000 mL/kg/h | INTRAVENOUS | Status: DC

## 2016-12-26 MED ORDER — SODIUM CHLORIDE 0.9 % IV SOLN: 250.0000 mL | INTRAVENOUS | Status: DC | PRN

## 2016-12-26 MED ORDER — FENTANYL CITRATE (PF) 100 MCG/2ML IJ SOLN
INTRAMUSCULAR | Status: DC | PRN
  Administered 2016-12-26: 25 ug via INTRAVENOUS

## 2016-12-26 MED ORDER — NITROGLYCERIN 0.4 MG SL SUBL
0.4000 mg | SUBLINGUAL_TABLET | SUBLINGUAL | Status: DC | PRN
  Administered 2016-12-26 (×3): 0.4 mg via SUBLINGUAL
  Filled 2016-12-26: qty 1

## 2016-12-26 MED ORDER — IOPAMIDOL (ISOVUE-370) INJECTION 76%
INTRAVENOUS | Status: DC | PRN
  Administered 2016-12-26: 195 mL via INTRA_ARTERIAL

## 2016-12-26 MED ORDER — ACETAMINOPHEN 325 MG PO TABS
650.0000 mg | ORAL_TABLET | ORAL | Status: DC | PRN
  Filled 2016-12-26: qty 2

## 2016-12-26 MED ORDER — METOPROLOL TARTRATE 25 MG PO TABS
25.0000 mg | ORAL_TABLET | Freq: Two times a day (BID) | ORAL | Status: DC
  Administered 2016-12-26 – 2016-12-29 (×5): 25 mg via ORAL
  Filled 2016-12-26 (×6): qty 1

## 2016-12-26 MED ORDER — DIAZEPAM 5 MG PO TABS
5.0000 mg | ORAL_TABLET | Freq: Four times a day (QID) | ORAL | Status: DC | PRN
  Administered 2016-12-26: 5 mg via ORAL
  Filled 2016-12-26: qty 1

## 2016-12-26 MED ORDER — ONDANSETRON HCL 4 MG/2ML IJ SOLN
4.0000 mg | Freq: Four times a day (QID) | INTRAMUSCULAR | Status: DC | PRN
  Administered 2016-12-27: 4 mg via INTRAVENOUS
  Filled 2016-12-26: qty 2

## 2016-12-26 MED ORDER — HEPARIN (PORCINE) IN NACL 2-0.9 UNIT/ML-% IJ SOLN
INTRAMUSCULAR | Status: AC
  Filled 2016-12-26: qty 1000

## 2016-12-26 MED ORDER — MIDAZOLAM HCL 2 MG/2ML IJ SOLN
INTRAMUSCULAR | Status: DC | PRN
  Administered 2016-12-26: 2 mg via INTRAVENOUS

## 2016-12-26 MED ORDER — FENTANYL CITRATE (PF) 100 MCG/2ML IJ SOLN
INTRAMUSCULAR | Status: AC
  Filled 2016-12-26: qty 2

## 2016-12-26 MED ORDER — HEPARIN BOLUS VIA INFUSION
4000.0000 [IU] | Freq: Once | INTRAVENOUS | Status: AC
  Administered 2016-12-26: 4000 [IU] via INTRAVENOUS
  Filled 2016-12-26: qty 4000

## 2016-12-26 MED ORDER — LIDOCAINE HCL (PF) 1 % IJ SOLN
INTRAMUSCULAR | Status: DC | PRN
  Administered 2016-12-26: 2 mL

## 2016-12-26 MED ORDER — ACETAMINOPHEN 325 MG PO TABS
650.0000 mg | ORAL_TABLET | ORAL | Status: DC | PRN
  Administered 2016-12-26 – 2016-12-27 (×2): 650 mg via ORAL
  Filled 2016-12-26: qty 2

## 2016-12-26 SURGICAL SUPPLY — 11 items
CATH INFINITI 5 FR IM (CATHETERS) ×2 IMPLANT
CATH INFINITI 5FR MULTPACK ANG (CATHETERS) ×2 IMPLANT
GUIDEWIRE 3MM J TIP .035 145 (WIRE) ×2 IMPLANT
GUIDEWIRE ANGLED .035X150CM (WIRE) ×2 IMPLANT
KIT HEART LEFT (KITS) ×2 IMPLANT
PACK CARDIAC CATHETERIZATION (CUSTOM PROCEDURE TRAY) ×2 IMPLANT
SHEATH PINNACLE 5F 10CM (SHEATH) ×2 IMPLANT
SYR MEDRAD MARK V 150ML (SYRINGE) ×2 IMPLANT
TRANSDUCER W/STOPCOCK (MISCELLANEOUS) ×2 IMPLANT
TUBING CIL FLEX 10 FLL-RA (TUBING) ×2 IMPLANT
WIRE EMERALD 3MM-J .035X260CM (WIRE) ×2 IMPLANT

## 2016-12-26 NOTE — Patient Instructions (Addendum)
You are being admitted to [place] already have a bed on [address]. Called EMS to take you.

## 2016-12-26 NOTE — Progress Notes (Signed)
    Pt direct admit from the office by Dr. Irish Lack. Pt has been NPO since last evening, at the office to with chest pain. Recent stent back in 10/17 and stopped Brilinta after a month. Labs pending, will place for cath today. Discussed this with the patient and family at the bedside.   The patient understands that risks included but are not limited to stroke (1 in 1000), death (1 in 21), kidney failure [usually temporary] (1 in 500), bleeding (1 in 200), allergic reaction [possibly serious] (1 in 200).   SignedReino Bellis, NP-C 12/26/2016, 1:54 PM Pager: [phone]

## 2016-12-26 NOTE — Progress Notes (Signed)
Site area: rt groin fa sheath Site Prior to Removal:  Level 0 Pressure Applied For: 20 minutes Manual:   yes Patient Status During Pull:  stable Post Pull Site:  Level  0 Post Pull Instructions Given:  yes Post Pull Pulses Present: yes Dressing Applied:  Gauze and tegaderm Bedrest begins @ 6440 Comments:

## 2016-12-26 NOTE — Interval H&P Note (Signed)
Cath Lab Visit (complete for each Cath Lab visit)  Clinical Evaluation Leading to the Procedure:   ACS: No.  Non-ACS:    Anginal Classification: CCS III  Anti-ischemic medical therapy: Minimal Therapy (1 class of medications)  Non-Invasive Test Results: No non-invasive testing performed  Prior CABG: Previous CABG      History and Physical Interval Note:  12/26/2016 2:45 PM  [name]  has presented today for surgery, with the diagnosis of cp  The various methods of treatment have been discussed with the patient and family. After consideration of risks, benefits and other options for treatment, the patient has consented to  Procedure(s): Left Heart Cath and Cors/Grafts Angiography (N/A) as a surgical intervention .  The patient's history has been reviewed, patient examined, no change in status, stable for surgery.  I have reviewed the patient's chart and labs.  Questions were answered to the patient's satisfaction.     Shelva Majestic

## 2016-12-26 NOTE — H&P (View-Only) (Signed)
Cardiology Office Note   Date:  12/26/2016   ID:  [name], DOB 24-Aug-1950, MRN [id number]  PCP:  [name], MD    No chief complaint on file.  F/u CAD  Wt Readings from Last 3 Encounters:  12/26/16 223 lb 1.9 oz (101.2 kg)       History of Present Illness: [name] is a 66 y.o. male  Who had CABG x 3 in approximately 2008 in [place].  At the time, he had a chest tightness and pressure.    He had his sternal rings removed in about 2009.  In 2017 October, her had a NSTEMI and a DES to the first diagonal, resolute 2.25 x 22 mm.  Done at [facility].  LIMA to OM was patent.  SVG to diag patent.  Ostial 50% LAD lesion remains.  Unclear where the third graft.  RIMA mentioned but not in cath report.    He has some chest discomfort when he walks around. It is improved with rest and NTG.  Pain can radiate to his neck.  These sx started about 3 months ago, Feb 2018.  Sx have been getting worse.  It is occurring most times he walks.   CP associated with dizziness and SHOB.  Cath was done through the left wrist in 2017.  He stopped his Brilinta a month after he started.   He has not tried Imdur in the past. BP is usually high.  Denies : Leg edema.  Orthopnea. Palpitations. Paroxysmal nocturnal dyspnea. Syncope.   He is transferring care from [facility] to [place].    He has had CP this AM and used NTG.       Past Medical History:  Diagnosis Date  . Coronary artery disease    RAF-HCC  . MI (myocardial infarction) [facility])     Past Surgical History:  Procedure Laterality Date  . LEFT HEART CATH Left 05/07/2016   PR CATH PLACE/CORON ANGIO, IMG SUPER/TERP, W  [facility] CATH; SEVICE: CARDIOLOGY  . LEFT HEART VENTRICULOGRAPHY Left 05/14/2016   PR CATH PLACE/ CORON ANGIO, IMG SUPER/ INTERP, W      Current Outpatient Prescriptions  Medication Sig Dispense Refill  . albuterol (PROVENTIL HFA;VENTOLIN HFA) 108 (90 Base) MCG/ACT inhaler Inhale 2 puffs into the lungs  every 6 (six) hours as needed. AS NEEDED FOR WHEEZING    . aspirin 81 MG chewable tablet Chew 325 mg by mouth daily.     . budesonide-formoterol (SYMBICORT) 80-4.5 MCG/ACT inhaler Inhale 2 puffs into the lungs 2 (two) times daily as needed.    . metoprolol tartrate (LOPRESSOR) 25 MG tablet Take 25 mg by mouth 2 (two) times daily.     . nitroGLYCERIN (NITROSTAT) 0.4 MG SL tablet Place 0.4 mg under the tongue as needed for chest pain. X 3 doses     No current facility-administered medications for this visit.     Allergies:   Clopidogrel; Codeine; Hydrocodone-acetaminophen; Simvastatin; and Influenza vaccines    Social History:  The patient  reports that he quit smoking about 7 months ago. He has never used smokeless tobacco. He reports that he uses drugs. He reports that he does not drink alcohol.   Family History:  The patient's family history includes Hypertension in his brother, brother, and sister; Kidney failure in his mother.    ROS:  Please see the history of present illness.   Otherwise, review of systems are positive for CP, increased BP.  All other systems are reviewed and negative.    PHYSICAL EXAM: VS:  BP (!) 164/90 (BP Location: Right Arm, Patient Position: Sitting, Cuff Size: Large)   Pulse (!) 54   Ht 5\' 11"  (1.803 m)   Wt 223 lb 1.9 oz (101.2 kg)   SpO2 99%   BMI 31.12 kg/m  , BMI Body mass index is 31.12 kg/m. GEN: Well nourished, well developed, in no acute distress  HEENT: normal  Neck: no JVD, carotid bruits, or masses Cardiac: RRR; no murmurs, rubs, or gallops,no edema  Respiratory:  clear to auscultation bilaterally, normal work of breathing GI: soft, nontender, nondistended, + BS MS: no deformity or atrophy  Skin: warm and dry, no rash Neuro:  Strength and sensation are intact Psych: euthymic mood, full affect   EKG:   The ekg ordered today demonstrates sinus bradycardia, no ST changes   Recent Labs: No results found for requested labs within last  8760 hours.   Lipid Panel    Component Value Date/Time   CHOL  04/15/2008 0425    177        ATP III CLASSIFICATION:  <200     mg/dL   Desirable  200-239  mg/dL   Borderline High  >=240    mg/dL   High   TRIG 63 04/15/2008 0425   HDL 32 (L) 04/15/2008 0425   CHOLHDL 5.5 04/15/2008 0425   VLDL 13 04/15/2008 0425   LDLCALC (H) 04/15/2008 0425    132        Total Cholesterol/HDL:CHD Risk Coronary Heart Disease Risk Table                     Men   Women  1/2 Average Risk   3.4   3.3     Other studies Reviewed: Additional studies/ records that were reviewed today with results demonstrating: 2017 cath report reviewed.   ASSESSMENT AND PLAN:  1. CAD:  Sx concerning for angina.  Will give 180 mg Brilinta today in the office and will need Clopidogrel 75 mg daily, longterm.  Will admit to the hospital with plans for cardiac cath.  I have spoken to the interventional cardiology team and they will manage the patient. Initially, we plan to send him by private vehicle but he became dizzy in the office and nearly fell. Therefore, we are sending him by EMS to the hospital.  After calling EMS,   I spoke to his wife who states that he has been "staggering for weeks.".  RIMA graft listed, but patient states that last cath was done from the wrist.  ? Atretic RIMA to LAD. 2. Intolerant of statins:  He has tried lipitor, Crestor, Zocor, pravastatin in the past and became" sick as a dog."  Refer lipid clinic to see about PCSK9. 3. He needs to stop smoking.  [place], given to help.  4. Hypertension: Blood pressure is been high. He will likely need an ACE inhibitor if his renal function will tolerate.   Current medicines are reviewed at length with the patient today.  The patient concerns regarding his medicines were addressed.  The following changes have been made:  No change  Labs/ tests ordered today include:  No orders of the defined types were placed in this encounter.   Recommend 150  minutes/week of aerobic exercise Low fat, low carb, high fiber diet recommended  Disposition:   FU based on hospital results   Signed, Larae Grooms, MD  12/26/2016  10:00 AM    [facility] [place], [place], [place]  [address] Phone: [phone]; Fax: [phone]

## 2016-12-26 NOTE — Progress Notes (Signed)
ANTICOAGULATION CONSULT NOTE - Initial Consult  Pharmacy Consult for Heparin Indication: chest pain/ACS  Allergies  Allergen Reactions  . Clopidogrel     Other reaction(s): GI Upset (intolerance) Other reaction(s): GI Upset (intolerance)  . Codeine Other (See Comments)    Other reaction(s): GI Upset (intolerance) Pt not sure  . Hydrocodone-Acetaminophen     Other reaction(s): GI Upset (intolerance)  . Simvastatin     Other reaction(s): GI Upset (intolerance)  . Influenza Vaccines Nausea And Vomiting    Patient Measurements: Height: 5\' 11"  (180.3 cm) Weight: 222 lb 12.8 oz (101.1 kg) IBW/kg (Calculated) : 75.3  Vital Signs: Temp: 98 F (36.7 C) (05/23 1117) Temp Source: Oral (05/23 1117) BP: 164/67 (05/23 1117) Pulse Rate: 55 (05/23 1117)  Labs:  Recent Labs  12/26/16 1118  HGB 15.1  HCT 43.8  PLT 238    CrCl cannot be calculated (Patient's most recent lab result is older than the maximum 21 days allowed.).   Medical History: Past Medical History:  Diagnosis Date  . Coronary artery disease    RAF-HCC  . MI (myocardial infarction) [facility])     Assessment: 66  year old male to begin heparin for chest pain  Goal of Therapy:  Heparin level 0.3-0.7 units/ml Monitor platelets by anticoagulation protocol: Yes   Plan:  Heparin 4000 units iv bolus x 1 Heparin drip at 1350 units / hr Heparin level 6 hours after heparin starts Daily heparin level, CBC  Thank you Anette Guarneri, PharmD [phone]  Tad Moore 12/26/2016,12:14 PM

## 2016-12-27 ENCOUNTER — Encounter (HOSPITAL_COMMUNITY): Payer: Self-pay | Admitting: Cardiovascular Disease

## 2016-12-27 DIAGNOSIS — I200 Unstable angina: Secondary | ICD-10-CM

## 2016-12-27 DIAGNOSIS — I2510 Atherosclerotic heart disease of native coronary artery without angina pectoris: Secondary | ICD-10-CM

## 2016-12-27 LAB — CBC
HCT: 39 % (ref 39.0–52.0)
HEMOGLOBIN: 13.2 g/dL (ref 13.0–17.0)
MCH: 30.6 pg (ref 26.0–34.0)
MCHC: 33.8 g/dL (ref 30.0–36.0)
MCV: 90.3 fL (ref 78.0–100.0)
Platelets: 222 10*3/uL (ref 150–400)
RBC: 4.32 MIL/uL (ref 4.22–5.81)
RDW: 13.5 % (ref 11.5–15.5)
WBC: 8 10*3/uL (ref 4.0–10.5)

## 2016-12-27 LAB — BASIC METABOLIC PANEL
ANION GAP: 6 (ref 5–15)
BUN: 13 mg/dL (ref 6–20)
CALCIUM: 8.4 mg/dL — AB (ref 8.9–10.3)
CO2: 25 mmol/L (ref 22–32)
Chloride: 106 mmol/L (ref 101–111)
Creatinine, Ser: 1.07 mg/dL (ref 0.61–1.24)
Glucose, Bld: 131 mg/dL — ABNORMAL HIGH (ref 65–99)
Potassium: 4 mmol/L (ref 3.5–5.1)
Sodium: 137 mmol/L (ref 135–145)

## 2016-12-27 LAB — LIPID PANEL
CHOLESTEROL: 169 mg/dL (ref 0–200)
HDL: 37 mg/dL — AB (ref >40)
LDL Cholesterol: 95 mg/dL (ref 0–99)
TRIGLYCERIDES: 187 mg/dL — AB (ref <150)
Total CHOL/HDL Ratio: 4.6 RATIO
VLDL: 37 mg/dL (ref 0–40)

## 2016-12-27 LAB — TROPONIN I

## 2016-12-27 LAB — HEPARIN LEVEL (UNFRACTIONATED): HEPARIN UNFRACTIONATED: 0.54 [IU]/mL (ref 0.30–0.70)

## 2016-12-27 MED ORDER — SODIUM CHLORIDE 0.9% FLUSH
3.0000 mL | Freq: Two times a day (BID) | INTRAVENOUS | Status: DC
  Administered 2016-12-27: 3 mL via INTRAVENOUS

## 2016-12-27 MED ORDER — SODIUM CHLORIDE 0.9 % WEIGHT BASED INFUSION: 3.0000 mL/kg/h | INTRAVENOUS | Status: DC

## 2016-12-27 MED ORDER — LISINOPRIL 5 MG PO TABS
5.0000 mg | ORAL_TABLET | Freq: Every day | ORAL | Status: DC
  Administered 2016-12-27 – 2016-12-28 (×2): 5 mg via ORAL
  Filled 2016-12-27 (×3): qty 1

## 2016-12-27 MED ORDER — SODIUM CHLORIDE 0.9 % WEIGHT BASED INFUSION: 1.0000 mL/kg/h | INTRAVENOUS | Status: DC

## 2016-12-27 MED ORDER — HEPARIN (PORCINE) IN NACL 100-0.45 UNIT/ML-% IJ SOLN
1300.0000 [IU]/h | INTRAMUSCULAR | Status: DC
  Administered 2016-12-27: 1400 [IU]/h via INTRAVENOUS
  Administered 2016-12-28: 1300 [IU]/h via INTRAVENOUS
  Filled 2016-12-27 (×2): qty 250

## 2016-12-27 MED ORDER — NICOTINE 21 MG/24HR TD PT24
21.0000 mg | MEDICATED_PATCH | Freq: Every day | TRANSDERMAL | Status: DC
  Administered 2016-12-27 – 2016-12-29 (×3): 21 mg via TRANSDERMAL
  Filled 2016-12-27 (×3): qty 1

## 2016-12-27 MED ORDER — ASPIRIN 81 MG PO CHEW
81.0000 mg | CHEWABLE_TABLET | ORAL | Status: AC
  Administered 2016-12-28: 81 mg via ORAL

## 2016-12-27 MED ORDER — SODIUM CHLORIDE 0.9 % IV SOLN: 250.0000 mL | INTRAVENOUS | Status: DC | PRN

## 2016-12-27 MED ORDER — NITROGLYCERIN IN D5W 200-5 MCG/ML-% IV SOLN: 0.0000 ug/min | Freq: Once | INTRAVENOUS | Status: DC

## 2016-12-27 MED ORDER — SODIUM CHLORIDE 0.9% FLUSH: 3.0000 mL | INTRAVENOUS | Status: DC | PRN

## 2016-12-27 MED ORDER — SODIUM CHLORIDE 0.9 % WEIGHT BASED INFUSION
3.0000 mL/kg/h | INTRAVENOUS | Status: DC
  Administered 2016-12-28: 3 mL/kg/h via INTRAVENOUS

## 2016-12-27 MED ORDER — TRAMADOL HCL 50 MG PO TABS
50.0000 mg | ORAL_TABLET | Freq: Once | ORAL | Status: AC
  Administered 2016-12-27: 50 mg via ORAL
  Filled 2016-12-27: qty 1

## 2016-12-27 MED FILL — Nitroglycerin IV Soln 100 MCG/ML in D5W: INTRA_ARTERIAL | Qty: 10 | Status: AC

## 2016-12-27 NOTE — Progress Notes (Signed)
[place] for Heparin Indication: chest pain/ACS  Allergies  Allergen Reactions  . Clopidogrel     Other reaction(s): GI Upset (intolerance) Other reaction(s): GI Upset (intolerance)  . Codeine Other (See Comments)    Other reaction(s): GI Upset (intolerance) Pt not sure  . Hydrocodone-Acetaminophen     Other reaction(s): GI Upset (intolerance)  . Simvastatin     Other reaction(s): GI Upset (intolerance)  . Influenza Vaccines Nausea And Vomiting    Patient Measurements: Height: 5\' 11"  (180.3 cm) Weight: 228 lb 11.2 oz (103.7 kg) IBW/kg (Calculated) : 75.3  Vital Signs: Temp: 98.5 F (36.9 C) (05/24 0522) Temp Source: Oral (05/24 0522) BP: 143/73 (05/24 0923) Pulse Rate: 65 (05/24 0923)  Labs:  Recent Labs  12/26/16 1118 12/26/16 1854 12/27/16 0104 12/27/16 0150  HGB 15.1  --   --  13.2  HCT 43.8  --   --  39.0  PLT 238  --   --  222  LABPROT 12.3  --   --   --   INR 0.92  --   --   --   HEPARINUNFRC  --  <0.10*  --   --   CREATININE 1.10  --   --  1.07  TROPONINI <0.03 <0.03 <0.03  --     Estimated Creatinine Clearance: 83.3 mL/min (by C-G formula based on SCr of 1.07 mg/dL).   Medical History: Past Medical History:  Diagnosis Date  . Cancer [facility])    history of prostate cancer  . COPD (chronic obstructive pulmonary disease) ([place])   . Coronary artery disease    RAF-HCC  . GERD (gastroesophageal reflux disease)   . Hyperlipidemia   . Hypertension   . MI (myocardial infarction) [facility])     Assessment: 66  year old male to restart heparin for chest pain  Goal of Therapy:  Heparin level 0.3-0.7 units/ml Monitor platelets by anticoagulation protocol: Yes   Plan:  Heparin drip at 1400 units / hr Heparin level 8 hours after heparin starts Daily heparin level, CBC  Thank you Anette Guarneri, PharmD [phone]  12/27/2016,11:30 AM

## 2016-12-27 NOTE — Progress Notes (Signed)
    Pt had recurrent chest pain, jaw and head pain. Will restart IV nitro. Give one time dose of tramadol for headache. Currently on IV heparin. On cath board for tomorrow.   Barnet Pall, NP-C 12/27/2016, 4:47 PM Pager: [phone]

## 2016-12-27 NOTE — Progress Notes (Signed)
[place] for Heparin Indication: chest pain/ACS  Allergies  Allergen Reactions  . Clopidogrel     Other reaction(s): GI Upset (intolerance) Other reaction(s): GI Upset (intolerance)  . Codeine Other (See Comments)    Other reaction(s): GI Upset (intolerance) Pt not sure  . Hydrocodone-Acetaminophen     Other reaction(s): GI Upset (intolerance)  . Simvastatin     Other reaction(s): GI Upset (intolerance)  . Influenza Vaccines Nausea And Vomiting    Patient Measurements: Height: 5\' 11"  (180.3 cm) Weight: 228 lb 11.2 oz (103.7 kg) IBW/kg (Calculated) : 75.3  Vital Signs: Temp: 98.6 F (37 C) (05/24 2100) Temp Source: Oral (05/24 2100) BP: 126/63 (05/24 2042) Pulse Rate: 72 (05/24 2042)  Labs:  Recent Labs  12/26/16 1118 12/26/16 1854 12/27/16 0104 12/27/16 0150 12/27/16 2026  HGB 15.1  --   --  13.2  --   HCT 43.8  --   --  39.0  --   PLT 238  --   --  222  --   LABPROT 12.3  --   --   --   --   INR 0.92  --   --   --   --   HEPARINUNFRC  --  <0.10*  --   --  0.54  CREATININE 1.10  --   --  1.07  --   TROPONINI <0.03 <0.03 <0.03  --   --     Estimated Creatinine Clearance: 83.3 mL/min (by C-G formula based on SCr of 1.07 mg/dL).   Medical History: Past Medical History:  Diagnosis Date  . Cancer [facility])    history of prostate cancer  . COPD (chronic obstructive pulmonary disease) ([place])   . Coronary artery disease    RAF-HCC  . GERD (gastroesophageal reflux disease)   . Hyperlipidemia   . Hypertension   . MI (myocardial infarction) [facility])     Assessment: 66  year old male continuing on heparin for ACS. Heparin level therapeutic at 0.54. CBC wnl. No bleed documented.  Goal of Therapy:  Heparin level 0.3-0.7 units/ml Monitor platelets by anticoagulation protocol: Yes   Plan:  Heparin drip at 1400 units / hr Confirmatory heparin level with AM labs Daily heparin level, CBC Monitor for s/sx bleeding Cath  scheduled for 5/25   Elicia Lamp, PharmD, BCPS Clinical Pharmacist 12/27/2016 9:24 PM

## 2016-12-27 NOTE — Progress Notes (Signed)
Around 2130 pt complained of 8/10 cp. Sublingual nitro given with no relief of pain. EKG and VS obtained. BP elevated 178/70. EKG showed no significant changes. Cath site Level 1 and tender. On call cardiologist, [place], notified and came to assess pt. Pt started on nitro drip at 52mcg. New order for 60mg  Imdur scheduled to start today at 1000. Will pass along to next shift. Hourly rounding performed. Fall risk bundle in place. Pt is unstable when ambulating. Bed locked in lowest position, bed alarm on, call light in reach. Will continue to monitor this shift.

## 2016-12-27 NOTE — Progress Notes (Addendum)
Np on call notified of pt c/o of not feeling right. Per pt he's nauseous & dizzy. Pt also c/o of bilateral leg pain. Pt given tylenol & zofran. Will continue to monitor pt. VS stable & charted. Hoover Brunette, RN   Np Mancel Bale called again d/t pt ambulating in the hallway & began to be diaphoretic, flushed in the face, dizzy, & per pt he felt like his throat was closing. Pt assisted to chair by staff & rolled back into his room. When the incident occurred vs were taken & his bp was 150/63 upon returning to his room Bp rechecked & noted to be in the 120s/80s. Shortly afterwards pt started c/o of 9/10 h/a & 6/10 chest pressure with radiation to his rt jaw. New orders for EKG which showed NSR. Pt also restarted on nitro at 30mcg. Will continue to monitor the pt. Hoover Brunette, RN

## 2016-12-27 NOTE — Care Management Note (Signed)
Case Management Note  Patient Details  Name: [name] MRN: [id number] Date of Birth: [birth year]  Subjective/Objective: Pt presented for Chest Pain. Pt is from home with wife and plans to return home once stable. Pt is without a Part D Rx Drug Coverage Plan. CM did speak with pt in regards to getting a part D plan once able to because he is getting penalized 10% without a Rx plan. Pt is paying $185.00 per month for Brilinta Rx's. Pt is unable to do so.                 Action/Plan: CM did speak with NP Ria Comment in regards to seeing if she could check at the office for samples. CM did provide pt with the patient assistance from to complete and fax to company. No further needs from CM at this time.   Expected Discharge Date:                  Expected Discharge Plan:  Home/Self Care  In-House Referral:  NA  Discharge planning Services  CM Consult, Medication Assistance  Post Acute Care Choice:  NA Choice offered to:  NA  DME Arranged:  N/A DME Agency:  NA  HH Arranged:  NA HH Agency:  NA  Status of Service:  Completed, signed off  If discussed at [place] of Stay Meetings, dates discussed:    Additional Comments:  Bethena Roys, RN 12/27/2016, 11:23 AM

## 2016-12-27 NOTE — Progress Notes (Signed)
Progress Note  Patient Name: [name] Date of Encounter: 12/27/2016  Primary Cardiologist: Irish Lack  Subjective   No chest pain, but currently on nitro drip after episode this morning.   Inpatient Medications    Scheduled Meds: . aspirin  81 mg Oral Daily  . isosorbide mononitrate  60 mg Oral Daily  . metoprolol tartrate  25 mg Oral BID  . sodium chloride flush  3 mL Intravenous Q12H   Continuous Infusions: . sodium chloride 100 mL/hr at 12/26/16 1911  . sodium chloride    . nitroGLYCERIN 5 mcg/min (12/26/16 2218)   PRN Meds: sodium chloride, acetaminophen, acetaminophen, diazepam, nitroGLYCERIN, ondansetron (ZOFRAN) IV, ondansetron (ZOFRAN) IV, promethazine, sodium chloride flush   Vital Signs    Vitals:   12/26/16 2250 12/26/16 2345 12/27/16 0522 12/27/16 0746  BP: 140/60 (!) 112/57 134/71   Pulse: (!) 56 (!) 58 (!) 56 (!) 59  Resp: 12 11 12 12   Temp:  97.8 F (36.6 C) 98.5 F (36.9 C)   TempSrc:  Axillary Oral   SpO2: 97% 97% 98% 100%  Weight:   228 lb 11.2 oz (103.7 kg)   Height:        Intake/Output Summary (Last 24 hours) at 12/27/16 0749 Last data filed at 12/27/16 0600  Gross per 24 hour  Intake           548.72 ml  Output             1750 ml  Net         -1201.28 ml   Filed Weights   12/26/16 1117 12/27/16 0522  Weight: 222 lb 12.8 oz (101.1 kg) 228 lb 11.2 oz (103.7 kg)    Telemetry    SR - Personally Reviewed  ECG    SR - Personally Reviewed  Physical Exam   General: Well developed, well nourished, male appearing in no acute distress. Head: Normocephalic, atraumatic.  Neck: Supple without bruits, JVD. Lungs:  Resp regular and unlabored, CTA. Heart: RRR, S1, S2, no S3, S4, or murmur; no rub. Abdomen: Soft, non-tender, non-distended with normoactive bowel sounds. No hepatomegaly. No rebound/guarding. No obvious abdominal masses. Extremities: No clubbing, cyanosis, edema. Distal pedal pulses are 2+ bilaterally. Right groin site  stable without hematoma.  Neuro: Alert and oriented X 3. Moves all extremities spontaneously. Psych: Normal affect.  Labs    Chemistry Recent Labs Lab 12/26/16 1118 12/27/16 0150  NA 138 137  K 4.5 4.0  CL 105 106  CO2 25 25  GLUCOSE 88 131*  BUN 11 13  CREATININE 1.10 1.07  CALCIUM 9.0 8.4*  PROT 6.9  --   ALBUMIN 3.7  --   AST 15  --   ALT 16*  --   ALKPHOS 92  --   BILITOT 0.4  --   GFRNONAA >60 >60  GFRAA >60 >60  ANIONGAP 8 6     Hematology Recent Labs Lab 12/26/16 1118 12/27/16 0150  WBC 10.7* 8.0  RBC 4.84 4.32  HGB 15.1 13.2  HCT 43.8 39.0  MCV 90.5 90.3  MCH 31.2 30.6  MCHC 34.5 33.8  RDW 13.5 13.5  PLT 238 222    Cardiac Enzymes Recent Labs Lab 12/26/16 1118 12/26/16 1854 12/27/16 0104  TROPONINI <0.03 <0.03 <0.03   No results for input(s): TROPIPOC in the last 168 hours.   BNPNo results for input(s): BNP, PROBNP in the last 168 hours.   DDimer No results for input(s): DDIMER in the last 168  hours.    Radiology    No results found.  Cardiac Studies   LHC: 12/26/16  Conclusion     Acute Mrg lesion, 30 %stenosed.  LIMA.  Ost 2nd Mrg lesion, 95 %stenosed.  2nd Diag lesion, 0 %stenosed.  Ost 2nd Diag to 2nd Diag lesion, 30 %stenosed.  Origin lesion, 100 %stenosed.  RIMA.  Mid Graft lesion, 85 %stenosed.  Ost LAD to Prox LAD lesion, 0 %stenosed.  The left ventricular ejection fraction is 50-55% by visual estimate.  The left ventricular systolic function is normal.  There is moderate (3+) mitral regurgitation.  LM lesion, 95 %stenosed.   Preserved global LV function with an ejection fraction of approximately 55% but with moderate focal mid inferior hypocontractility.  There appears to be 3+ mitral regurgitation.  Significant native CAD with mild 10-20% distal left main tapering, 95% ostial LAD stenosis with a patent stent in the proximal LAD before the first diagonal vessel with 30% ostial narrowing of diagonal  vessel followed by a patent stent.  The diagonal  beyond the stented segment bifurcated and was small caliber; 95% ostial stenosis in the OM vessel which is bypassed; diffuse luminal irregularity of the proximal mid RCA with 30% narrowing in the anterior RV marginal branch.  Old occlusion of the vein graft which had supplied the distal RCA.  Patent LIMA graft which supplies the circumflex marginal vessel.  The marginal vessel, bifurcates beyond the anastomosis and is free of significant disease.  Patent RIMA graft which seems to supply the diagonal vessel beyond the stented segment and does not fill into the LAD.  There is a "kink" in the midportion of the graft on a sharp bend with narrowing up to 90%.  Selective injection of IC nitroglycerin resolves this 85-90% stenosis, suggesting a component of spasm induced by the kinked segment.  Distal aortography demonstrates 40-50% narrowing in the midportion of the right iliac artery.  RECOMMENDATION: Medical therapy.  Smoking cessation is imperative.  Nitrates will be added to the patient's medical regimen, particularly since the kinked portion of the RIMA graft improved following IC nitroglycerin administration.     Patient Profile     66 y.o. male with PMH of CABG v3 ('08), HTN, HL, COPD who presented to the office on 12/26/16 as a new patient reporting chest pain and exertional dyspnea.   Assessment & Plan    1. Chest pain: Direct admit from the office, underwent LHC yesterday with Dr. Fletcher Anon noted above with old occlusion of SVG to dRCA, patent LIMA to Lcx, and patent but "kinked" RIMA to diag that does not fill the LAD. Also with 95% lesion in the pLAD. Plan was add oral nitrates to medication regimen. Did have recurrent pain last evening and was started on IV nitro.  -- will dc IV nitro this morning. Imdur already ordered. Discuss further plan with MD.  2. CAD s/p CABG 3v and stenting: Had DES to first diag in 10/17, was on Brilinta for  one month and then stopped as he could not afford it. He is allergic to plavix, states he had swelling in his throat. Will discuss further antiplatelet therapy options with MD.   3. HTN: Borderline controlled. Will add low dose lisinopril today.   4. HL: Intolerant to statins, consider lipid clinic referral.   5. Tobacco abuse: Reported he has reduced his smoking to 1/2 pack a day. Counseled on the need for cessation.   Signed, Reino Bellis, NP  12/27/2016, 7:49 AM  Agree with note by Reino Bellis NP-C  I have reviewed cath film with Dr Burt Knack. The LIMA is patent to an OM and the RIMA to a Diag. The [place] has a 'kink' in it's mid portion. There is a stent in mid LAD and prox Diag. There is no back filling of LAD via Diag from [place] since there is antegrade flow down each. I'm concerned that the LAD is in jeopardy secondary to its ostial lesion and feel that the patient would benefit from intervention. A major concern is medication compliance with DAPT (he stopped his Brilenta in Nov after his stent was placed in October in [place]).   Lorretta Harp, M.D., [place], [facility], [place] [address] [place], [place]  [address]  [phone] 12/27/2016 10:49 AM

## 2016-12-28 ENCOUNTER — Encounter (HOSPITAL_COMMUNITY)
Admission: AD | Disposition: A | Discharge status: Home / Self Care | Payer: Self-pay | Source: Ambulatory Visit | Attending: Interventional Cardiology

## 2016-12-28 ENCOUNTER — Encounter (HOSPITAL_COMMUNITY): Payer: Self-pay | Admitting: Cardiovascular Disease

## 2016-12-28 ENCOUNTER — Telehealth: Payer: Self-pay | Admitting: Interventional Cardiology

## 2016-12-28 HISTORY — PX: CORONARY ANGIOGRAPHY: CATH118303

## 2016-12-28 HISTORY — PX: CORONARY ATHERECTOMY: CATH118238

## 2016-12-28 HISTORY — PX: CORONARY STENT INTERVENTION: CATH118234

## 2016-12-28 LAB — BASIC METABOLIC PANEL
Anion gap: 5 (ref 5–15)
BUN: 20 mg/dL (ref 6–20)
CO2: 25 mmol/L (ref 22–32)
CREATININE: 1.3 mg/dL — AB (ref 0.61–1.24)
Calcium: 8.4 mg/dL — ABNORMAL LOW (ref 8.9–10.3)
Chloride: 107 mmol/L (ref 101–111)
GFR calc Af Amer: >60 mL/min (ref >60)
GFR calc non Af Amer: 56 mL/min — ABNORMAL LOW (ref >60)
GLUCOSE: 119 mg/dL — AB (ref 65–99)
Potassium: 4.6 mmol/L (ref 3.5–5.1)
SODIUM: 137 mmol/L (ref 135–145)

## 2016-12-28 LAB — CBC
HCT: 38.3 % — ABNORMAL LOW (ref 39.0–52.0)
Hemoglobin: 12.8 g/dL — ABNORMAL LOW (ref 13.0–17.0)
MCH: 30.5 pg (ref 26.0–34.0)
MCHC: 33.4 g/dL (ref 30.0–36.0)
MCV: 91.2 fL (ref 78.0–100.0)
PLATELETS: 207 10*3/uL (ref 150–400)
RBC: 4.2 MIL/uL — ABNORMAL LOW (ref 4.22–5.81)
RDW: 13.7 % (ref 11.5–15.5)
WBC: 9.7 10*3/uL (ref 4.0–10.5)

## 2016-12-28 LAB — HEMOGLOBIN A1C
HEMOGLOBIN A1C: 5.8 % — AB (ref 4.8–5.6)
MEAN PLASMA GLUCOSE: 120 mg/dL

## 2016-12-28 LAB — POCT ACTIVATED CLOTTING TIME
ACTIVATED CLOTTING TIME: 731 s
Activated Clotting Time: 417 seconds

## 2016-12-28 LAB — HEPARIN LEVEL (UNFRACTIONATED): HEPARIN UNFRACTIONATED: 0.73 [IU]/mL — AB (ref 0.30–0.70)

## 2016-12-28 SURGERY — CORONARY STENT INTERVENTION
Anesthesia: LOCAL

## 2016-12-28 SURGERY — LEFT HEART CATH AND CORONARY ANGIOGRAPHY
Anesthesia: LOCAL

## 2016-12-28 MED ORDER — LABETALOL HCL 5 MG/ML IV SOLN: 10.0000 mg | INTRAVENOUS | Status: AC | PRN

## 2016-12-28 MED ORDER — FENTANYL CITRATE (PF) 100 MCG/2ML IJ SOLN
INTRAMUSCULAR | Status: DC | PRN
  Administered 2016-12-28: 50 ug via INTRAVENOUS
  Administered 2016-12-28: 25 ug via INTRAVENOUS
  Administered 2016-12-28: 50 ug via INTRAVENOUS

## 2016-12-28 MED ORDER — SODIUM CHLORIDE 0.9% FLUSH
3.0000 mL | Freq: Two times a day (BID) | INTRAVENOUS | Status: DC
Start: 2016-12-28 — End: 2016-12-28

## 2016-12-28 MED ORDER — FENTANYL CITRATE (PF) 100 MCG/2ML IJ SOLN
INTRAMUSCULAR | Status: AC
  Filled 2016-12-28: qty 2

## 2016-12-28 MED ORDER — NITROGLYCERIN 1 MG/10 ML FOR IR/CATH LAB
INTRA_ARTERIAL | Status: DC | PRN
  Administered 2016-12-28: 200 ug via INTRACORONARY

## 2016-12-28 MED ORDER — MIDAZOLAM HCL 2 MG/2ML IJ SOLN
INTRAMUSCULAR | Status: DC | PRN
  Administered 2016-12-28: 1 mg via INTRAVENOUS
  Administered 2016-12-28: 2 mg via INTRAVENOUS

## 2016-12-28 MED ORDER — IOPAMIDOL (ISOVUE-370) INJECTION 76%
INTRAVENOUS | Status: AC
Start: 2016-12-28 — End: 2016-12-28
  Filled 2016-12-28: qty 50

## 2016-12-28 MED ORDER — LIDOCAINE HCL (PF) 1 % IJ SOLN
INTRAMUSCULAR | Status: AC
  Filled 2016-12-28: qty 30

## 2016-12-28 MED ORDER — HEPARIN SODIUM (PORCINE) 1000 UNIT/ML IJ SOLN
INTRAMUSCULAR | Status: AC
  Filled 2016-12-28: qty 1

## 2016-12-28 MED ORDER — IOPAMIDOL (ISOVUE-370) INJECTION 76%
INTRAVENOUS | Status: DC | PRN
  Administered 2016-12-28: 110 mL

## 2016-12-28 MED ORDER — VIPERSLIDE LUBRICANT OPTIME
TOPICAL | Status: DC | PRN
  Administered 2016-12-28: 09:00:00 via SURGICAL_CAVITY

## 2016-12-28 MED ORDER — HYDRALAZINE HCL 20 MG/ML IJ SOLN: 5.0000 mg | INTRAMUSCULAR | Status: AC | PRN

## 2016-12-28 MED ORDER — SODIUM CHLORIDE 0.9 % IV SOLN
INTRAVENOUS | Status: AC
  Administered 2016-12-28: 12:00:00 via INTRAVENOUS

## 2016-12-28 MED ORDER — LIDOCAINE HCL (PF) 1 % IJ SOLN
INTRAMUSCULAR | Status: DC | PRN
  Administered 2016-12-28: 2 mL via INTRADERMAL

## 2016-12-28 MED ORDER — NITROGLYCERIN 1 MG/10 ML FOR IR/CATH LAB
INTRA_ARTERIAL | Status: AC
  Filled 2016-12-28: qty 10

## 2016-12-28 MED ORDER — MIDAZOLAM HCL 2 MG/2ML IJ SOLN
INTRAMUSCULAR | Status: AC
  Filled 2016-12-28: qty 2

## 2016-12-28 MED ORDER — TICAGRELOR 90 MG PO TABS
180.0000 mg | ORAL_TABLET | Freq: Once | ORAL | Status: AC
  Administered 2016-12-28: 180 mg via ORAL
  Filled 2016-12-28: qty 2

## 2016-12-28 MED ORDER — HEPARIN (PORCINE) IN NACL 2-0.9 UNIT/ML-% IJ SOLN
INTRAMUSCULAR | Status: DC | PRN
  Administered 2016-12-28: 10 mL via INTRA_ARTERIAL

## 2016-12-28 MED ORDER — HEPARIN (PORCINE) IN NACL 2-0.9 UNIT/ML-% IJ SOLN
INTRAMUSCULAR | Status: AC
  Filled 2016-12-28: qty 1000

## 2016-12-28 MED ORDER — HEPARIN (PORCINE) IN NACL 2-0.9 UNIT/ML-% IJ SOLN
INTRAMUSCULAR | Status: DC | PRN
  Administered 2016-12-28: 09:00:00

## 2016-12-28 MED ORDER — SODIUM CHLORIDE 0.9% FLUSH: 3.0000 mL | INTRAVENOUS | Status: DC | PRN

## 2016-12-28 MED ORDER — VERAPAMIL HCL 2.5 MG/ML IV SOLN
INTRAVENOUS | Status: AC
  Filled 2016-12-28: qty 2

## 2016-12-28 MED ORDER — HEPARIN SODIUM (PORCINE) 1000 UNIT/ML IJ SOLN
INTRAMUSCULAR | Status: DC | PRN
  Administered 2016-12-28: 12000 [IU] via INTRAVENOUS

## 2016-12-28 MED ORDER — TICAGRELOR 90 MG PO TABS
90.0000 mg | ORAL_TABLET | Freq: Two times a day (BID) | ORAL | Status: DC
  Administered 2016-12-28 – 2016-12-29 (×2): 90 mg via ORAL
  Filled 2016-12-28 (×2): qty 1

## 2016-12-28 MED ORDER — IOPAMIDOL (ISOVUE-370) INJECTION 76%
INTRAVENOUS | Status: AC
  Filled 2016-12-28: qty 125

## 2016-12-28 MED ORDER — SODIUM CHLORIDE 0.9 % IV SOLN: 250.0000 mL | INTRAVENOUS | Status: DC | PRN

## 2016-12-28 SURGICAL SUPPLY — 23 items
BALLN EMERGE MR 2.5X8 (BALLOONS) ×2
BALLN ~~LOC~~ EMERGE MR 3.0X6 (BALLOONS) ×2
BALLOON EMERGE MR 2.5X8 (BALLOONS) ×1 IMPLANT
BALLOON ~~LOC~~ EMERGE MR 3.0X6 (BALLOONS) ×1 IMPLANT
CATH MICROGUIDE FINCRSS 150 CM (MICROCATHETER) ×1 IMPLANT
CATH VISTA GUIDE 6FR XBLAD3.5 (CATHETERS) ×2 IMPLANT
CROWN DIAMONDBACK CLASSIC 1.25 (BURR) ×2 IMPLANT
DEVICE RAD COMP TR BAND LRG (VASCULAR PRODUCTS) ×2 IMPLANT
ELECT DEFIB PAD ADLT CADENCE (PAD) ×2 IMPLANT
GLIDESHEATH SLEND SS 6F .021 (SHEATH) ×2 IMPLANT
GUIDEWIRE INQWIRE 1.5J.035X260 (WIRE) ×1 IMPLANT
INQWIRE 1.5J .035X260CM (WIRE) ×2
KIT ENCORE 26 ADVANTAGE (KITS) ×2 IMPLANT
KIT HEART LEFT (KITS) ×2 IMPLANT
LUBRICANT VIPERSLIDE CORONARY (MISCELLANEOUS) ×2 IMPLANT
MICROGUIDE FINECROSS 150 CM (MICROCATHETER) ×2
PACK CARDIAC CATHETERIZATION (CUSTOM PROCEDURE TRAY) ×2 IMPLANT
STENT PROMUS PREM MR 2.75X8 (Permanent Stent) ×2 IMPLANT
TRANSDUCER W/STOPCOCK (MISCELLANEOUS) ×2 IMPLANT
TUBING CIL FLEX 10 FLL-RA (TUBING) ×2 IMPLANT
WIRE COUGAR XT STRL 190CM (WIRE) ×2 IMPLANT
WIRE COUGAR XT STRL 300CM (WIRE) ×2 IMPLANT
WIRE VIPER ADVANCE COR .012TIP (WIRE) ×2 IMPLANT

## 2016-12-28 NOTE — H&P (View-Only) (Signed)
    Pt had recurrent chest pain, jaw and head pain. Will restart IV nitro. Give one time dose of tramadol for headache. Currently on IV heparin. On cath board for tomorrow.   Barnet Pall, NP-C 12/27/2016, 4:47 PM Pager: [phone]

## 2016-12-28 NOTE — Discharge Summary (Signed)
Discharge Summary    Patient ID: [name],  MRN: [id number], DOB/AGE: May 13, 1951 66 y.o.  Admit date: 12/26/2016 Discharge date: 12/29/2016  Primary Care Provider: [name] Primary Cardiologist: Irish Lack  Discharge Diagnoses    Principal Problem:   Unstable angina [facility])  **s/p PCI/DES of the native distal LM/ostial LAD. Active Problems:   Coronary artery disease   Hx of CABG   Essential hypertension   Hyperlipidemia   Tobacco Abuse   COPD (chronic obstructive pulmonary disease) (HCC)   Allergies Allergies  Allergen Reactions  . Clopidogrel     Other reaction(s): GI Upset (intolerance) Other reaction(s): GI Upset (intolerance)  . Codeine Other (See Comments)    Other reaction(s): GI Upset (intolerance) Pt not sure  . Hydrocodone-Acetaminophen     Other reaction(s): GI Upset (intolerance)  . Simvastatin     Other reaction(s): GI Upset (intolerance)  . Influenza Vaccines Nausea And Vomiting    Diagnostic Studies/Procedures    Cardiac Catheterization 5.23.2018  Coronary Findings  Dominance: Right  Left Main  LM lesion, 95% stenosed.  Left Anterior Descending  Ost LAD to Prox LAD lesion, 0% stenosed. Ost LAD to Prox LAD lesion with no stenosis was previously treated.  Second Diagonal [employer] 2nd Diag to 2nd Diag lesion, 30% stenosed.  2nd Diag lesion, 0% stenosed. 2nd Diag lesion with no stenosis was previously treated.  Left Circumflex  First Obtuse Marginal Branch  Vessel is small in size.  Second Obtuse Marginal Branch  Ost 2nd Mrg lesion, 95% stenosed.  Right Coronary Artery  The vessel exhibits minimal luminal irregularities.  Acute Marginal Branch  Acute Mrg lesion, 30% stenosed.  Graft Angiography  Free LIMA Graft to 2nd Mrg  LIMA.  Graft to RPDA  Origin lesion, 100% stenosed.  Free RIMA Graft to 2nd Diag  RIMA. There is a "kink" in the bend in the midportion of the RIMA graft, which seems to supply the diagonal beyond the stented  segment rather than LAD. Following 200 g of intracoronary nitroglycerin, the "kink" normalized suggesting a component of spasm.  Mid Graft lesion, 85% stenosed. The lesion is located at the bend and spasm.   The left ventricular size is normal. The left ventricular systolic function is normal. The left ventricular ejection fraction is 50-55% by visual estimate. There is moderate (3+) mitral regurgitation. _____________  Percutaneous Coronary Intervention  5.25.2018  The distal Left Main/Ostial LAD was successfully stented using a 2.75 x 8 mm Promus Premier DES.  _____________   History of Present Illness     [name] is a 66 y.o. male  Who had CABG x 3 in approximately 2008 in [place].  At the time, he had a chest tightness and pressure.  He had his sternal rings removed in about 2009. In 10/17, her had a NSTEMI and a DES to the first diagonal in [facility].  He was seen in the office by Dr. Irish Lack on 12/26/16 and reported chest discomfort when he walks around. It improved with rest and NTG. Pain did radiate to his neck. These symptom started about 3 months ago, Feb 2018.  Sx have been getting worse.  It was occurring most times he walks. CP associated with dizziness and SOB.  Cath was done through the left wrist in 2017. He stopped his Brilinta a month after he started. He was not tried Imdur in the past. BP was usually high in the office. He plans to transfer his  care from [facility]. Given his reported symptoms in the office he was direct admitted with plans for cardiac cath.   Hospital Course     He underwent LHC with Dr. Claiborne Billings on 12/26/16 noted above with patent LIMA to an OM, and RIMA to a diag. There was "kink" in the mid portion of the RIMA that improved with intracoronary ntg.  There was worsening of distal LM/osital LAD dzs from previous cath in 10/17.  Initially medical therapy was recommended however, he did experience more chest pain, prompting plan for intervention with PCI  to the 95% distal LM/ostial LAD lesion on 12/28/16.  This was successfully treated with a DES as outlined above. Hgb A1c was 5.8, LDL 187. Medication assistance forms were filled out via CM to obtain help with his cost of Brilinta this admission. He was given 2 weeks of samples from the office to use at discharge until his follow up appt. Instructed on the importance of DAPT and expressed understanding. Given prior statin intolerance, we have initiated zetia therapy. Further, given vasospasm noted in diagonal graft, long-acting nitrate therapy has been added. He has been ambulating w/o recurrent symptoms or limitations and will be discharged home today in good condition. _____________  Discharge Vitals & Exam Blood pressure (!) 154/61, pulse 62, temperature 97.7 F (36.5 C), temperature source Oral, resp. rate 17, height 5\' 11"  (1.803 m), weight 222 lb 3.2 oz (100.8 kg), SpO2 98 %.  Filed Weights   12/27/16 0522 12/28/16 0536 12/29/16 0248  Weight: 228 lb 11.2 oz (103.7 kg) 221 lb 3.2 oz (100.3 kg) 222 lb 3.2 oz (100.8 kg)   GEN: Well nourished, well developed, in no acute distress.  HEENT: Grossly normal.  Neck: Supple, no JVD, carotid bruits, or masses. Cardiac: RRR, no murmurs, rubs, or gallops. No clubbing, cyanosis, edema.  Radials/DP/PT 2+ and equal bilaterally. R femoral cath site with mild ecchymosis.  No bleeding/bruit/hematoma.  R radial cath site is mildly swollen w/o bleeding/bruit/hematoma. Respiratory:  Respirations regular and unlabored, clear to auscultation bilaterally. GI: Soft, nontender, nondistended, BS + x 4. MS: no deformity or atrophy. Skin: warm and dry, no rash. Neuro:  Strength and sensation are intact. Psych: AAOx3.  Normal affect.  Labs & Radiologic Studies    CBC  Recent Labs  12/26/16 1118 12/27/16 0150 12/28/16 0422  WBC 10.7* 8.0 9.7  NEUTROABS 7.4  --   --   HGB 15.1 13.2 12.8*  HCT 43.8 39.0 38.3*  MCV 90.5 90.3 91.2  PLT 238 222 956   Basic  Metabolic Panel  Recent Labs  12/28/16 0422 12/29/16 0605  NA 137 137  K 4.6 4.5  CL 107 109  CO2 25 23  GLUCOSE 119* 119*  BUN 20 17  CREATININE 1.30* 1.11  CALCIUM 8.4* 8.6*   Liver Function Tests  Recent Labs  12/26/16 1118  AST 15  ALT 16*  ALKPHOS 92  BILITOT 0.4  PROT 6.9  ALBUMIN 3.7   Cardiac Enzymes  Recent Labs  12/26/16 1118 12/26/16 1854 12/27/16 0104  TROPONINI <0.03 <0.03 <0.03   Hemoglobin A1C  Recent Labs  12/27/16 0149  HGBA1C 5.8*   Fasting Lipid Panel  Recent Labs  12/27/16 0500  CHOL 169  HDL 37*  LDLCALC 95  TRIG 187*  CHOLHDL 4.6   _____________   Disposition   Pt is being discharged home today in good condition.  Follow-up Plans & Appointments    Follow-up Information    Consuelo Pandy,  PA-C Follow up on 01/07/2017.   Specialties:  Cardiology, Radiology Why:  at 9:30am for your follow up appt. Please arrive by 9:15am to check in.  Contact information: [place] [phone]          Discharge Instructions    Amb Referral to Cardiac Rehabilitation    Complete by:  As directed    Diagnosis:  Coronary Stents      Discharge Medications   Current Discharge Medication List    START taking these medications   Details  ezetimibe (ZETIA) 10 MG tablet Take 1 tablet (10 mg total) by mouth daily. Qty: 30 tablet, Refills: 6    isosorbide mononitrate (IMDUR) 30 MG 24 hr tablet Take 1 tablet (30 mg total) by mouth daily. Qty: 30 tablet, Refills: 6    lisinopril (PRINIVIL,ZESTRIL) 5 MG tablet Take 1 tablet (5 mg total) by mouth daily. Qty: 30 tablet, Refills: 6    nicotine (NICODERM CQ - DOSED IN MG/24 HOURS) 14 mg/24hr patch Place 1 patch (14 mg total) onto the skin daily. Qty: 30 patch, Refills: 0    ticagrelor (BRILINTA) 90 MG TABS tablet Take 1 tablet (90 mg total) by mouth 2 (two) times daily. Qty: 60 tablet, Refills: 6      CONTINUE these medications which  have CHANGED   Details  aspirin 81 MG tablet Take 1 tablet (81 mg total) by mouth daily.    nitroGLYCERIN (NITROSTAT) 0.4 MG SL tablet Place 1 tablet (0.4 mg total) under the tongue as needed for chest pain. X 3 doses Qty: 25 tablet, Refills: 3      CONTINUE these medications which have NOT CHANGED   Details  metoprolol tartrate (LOPRESSOR) 25 MG tablet Take 25 mg by mouth 2 (two) times daily.          Outstanding Labs/Studies   F/u lipids in 6 wks (new zetia)  Duration of Discharge Encounter   Greater than 30 minutes including physician time.  Signed, Murray Hodgkins, NP 12/29/2016, 8:51 AM   As above, pt seen and examined; no recurrent CP or dyspnea; radial and femoral cath site with no hematoma; no bruit at femoral cath site. Plan DC today and FU in 1 week TOC appt. Then FU with Dr Irish Lack. Zetia initiated as pt statin intolerant. Check lipids and liver 6 weeks. Pt counseled on discontinuing.  > 30 min PA and physician time D2  Kirk Ruths, MD

## 2016-12-28 NOTE — Plan of Care (Signed)
Problem: Activity: Goal: Ability to tolerate increased activity will improve Outcome: Progressing Pt is very unsteady upon standing. Pt requires moderate assistance when transferring. Fall risk bundle in place. Will continue to monitor.

## 2016-12-28 NOTE — Progress Notes (Signed)
[place] for Heparin Indication: chest pain/ACS  Allergies  Allergen Reactions  . Clopidogrel     Other reaction(s): GI Upset (intolerance) Other reaction(s): GI Upset (intolerance)  . Codeine Other (See Comments)    Other reaction(s): GI Upset (intolerance) Pt not sure  . Hydrocodone-Acetaminophen     Other reaction(s): GI Upset (intolerance)  . Simvastatin     Other reaction(s): GI Upset (intolerance)  . Influenza Vaccines Nausea And Vomiting    Patient Measurements: Height: 5\' 11"  (180.3 cm) Weight: 228 lb 11.2 oz (103.7 kg) IBW/kg (Calculated) : 75.3  Vital Signs: Temp: 98.6 F (37 C) (05/24 2100) Temp Source: Oral (05/24 2100) BP: 126/63 (05/24 2042) Pulse Rate: 69 (05/24 2100)  Labs:  Recent Labs  12/26/16 1118 12/26/16 1854 12/27/16 0104 12/27/16 0150 12/27/16 2026 12/28/16 0422 12/28/16 0427  HGB 15.1  --   --  13.2  --  12.8*  --   HCT 43.8  --   --  39.0  --  38.3*  --   PLT 238  --   --  222  --  207  --   LABPROT 12.3  --   --   --   --   --   --   INR 0.92  --   --   --   --   --   --   HEPARINUNFRC  --  <0.10*  --   --  0.54  --  0.73*  CREATININE 1.10  --   --  1.07  --  1.30*  --   TROPONINI <0.03 <0.03 <0.03  --   --   --   --     Estimated Creatinine Clearance: 68.5 mL/min (A) (by C-G formula based on SCr of 1.3 mg/dL (H)).   Medical History: Past Medical History:  Diagnosis Date  . Cancer [facility])    history of prostate cancer  . COPD (chronic obstructive pulmonary disease) ([place])   . Coronary artery disease    RAF-HCC  . GERD (gastroesophageal reflux disease)   . Hyperlipidemia   . Hypertension   . MI (myocardial infarction) [facility])     Assessment: 66  year old male continuing on heparin for ACS. Heparin level this am slightly higher than desired at 0.73. CBC stable.   Goal of Therapy:  Heparin level 0.3-0.7 units/ml Monitor platelets by anticoagulation protocol: Yes   Plan:  1. Decrease  Heparin drip to 1300 units / hr 2. Repeat heparin level in 6 hours  3. Daily heparin level, CBC 4. Monitor for s/sx bleeding 5. Cath scheduled for 5/25   Vincenza Hews, PharmD, BCPS 12/28/2016, 5:12 AM

## 2016-12-28 NOTE — Telephone Encounter (Signed)
New message    7-10 day TOC appt made on 6/4 with Ellen Henri per Ria Comment

## 2016-12-28 NOTE — Progress Notes (Signed)
[phone] Notified by cath lab need to see pt today because he is leaving early morning. Education completed with pt and wife who voiced understanding. Stressed that he must be on an antiplatelet- he stopped brilinta before for financial reasons. Wife has paper work for financial asst. Instructed them to also call the number on brilinta booklet. Reviewed ex ed, NTG use, risk factors, and heart healthy diet. Discussed smoking cessation and gave handout and fake cigarette. Pt to talk with cardiologist about if he can take something for nerves as when he tried to quit before, he became irritable and also ate a lot more. Discussed CRP 2 and will refer to [place] program. Graylon Good RN BSN 12/28/2016 11:09 AM

## 2016-12-28 NOTE — Interval H&P Note (Signed)
History and Physical Interval Note:  12/28/2016 8:21 AM  [name]  has presented today for cardiac cath/PCI with the diagnosis of severe CAD, unstable angina. The various methods of treatment have been discussed with the patient and family. After consideration of risks, benefits and other options for treatment, the patient has consented to  Procedure(s): Left Heart Cath and Cors/Grafts Angiography (N/A) as a surgical intervention .  The patient's history has been reviewed, patient examined, no change in status, stable for surgery.  I have reviewed the patient's chart and labs.  Questions were answered to the patient's satisfaction.    Cath Lab Visit (complete for each Cath Lab visit)  Clinical Evaluation Leading to the Procedure:   ACS: No.  Non-ACS:    Anginal Classification: CCS III  Anti-ischemic medical therapy: Minimal Therapy (1 class of medications)  Non-Invasive Test Results: No non-invasive testing performed  Prior CABG: Previous CABG         Lauree Chandler

## 2016-12-28 NOTE — Care Management Note (Signed)
Case Management Note  Patient Details  Name: [name] MRN: [id number] Date of Birth: April 04, 1951  Subjective/Objective:      Pt presented for Chest Pain. Pt is from home with wife and plans to return home once stable. Pt is without a Part D Rx Drug Coverage Plan. CM did speak with pt in regards to getting a part D plan once able to because he is getting penalized 10% without a Rx plan. Pt is paying $185.00 per month for Brilinta Rx's. Pt is unable to do so.                            Action/Plan: CM did speak with NP Ria Comment in regards to seeing if she could check at the office for samples. CM did provide pt with the patient assistance from to complete and fax to company. No further needs from CM at this time.    Expected Discharge Date:                  Expected Discharge Plan:  Home/Self Care  In-House Referral:  NA  Discharge planning Services  CM Consult, Medication Assistance  Post Acute Care Choice:  NA Choice offered to:  NA  DME Arranged:  N/A DME Agency:  NA  HH Arranged:  NA HH Agency:  NA  Status of Service:  Completed, signed off  If discussed at [place] of Stay Meetings, dates discussed:    Additional Comments:  5/25 [place] RN BSN- NCM spoke with patient and wife, reinforced information about having the patient ast application completed when he goes to the cardiology follow up apt,  He said he will.  He has the brilinta samples with him already for several months.  Jonathan Mayo, RN 12/28/2016, 3:12 PM

## 2016-12-29 ENCOUNTER — Encounter (HOSPITAL_COMMUNITY): Payer: Self-pay | Admitting: Nurse Practitioner

## 2016-12-29 DIAGNOSIS — Z720 Tobacco use: Secondary | ICD-10-CM

## 2016-12-29 LAB — BASIC METABOLIC PANEL
Anion gap: 5 (ref 5–15)
BUN: 17 mg/dL (ref 6–20)
CHLORIDE: 109 mmol/L (ref 101–111)
CO2: 23 mmol/L (ref 22–32)
CREATININE: 1.11 mg/dL (ref 0.61–1.24)
Calcium: 8.6 mg/dL — ABNORMAL LOW (ref 8.9–10.3)
GFR calc Af Amer: >60 mL/min (ref >60)
GFR calc non Af Amer: >60 mL/min (ref >60)
Glucose, Bld: 119 mg/dL — ABNORMAL HIGH (ref 65–99)
Potassium: 4.5 mmol/L (ref 3.5–5.1)
SODIUM: 137 mmol/L (ref 135–145)

## 2016-12-29 MED ORDER — NICOTINE 14 MG/24HR TD PT24: 14.0000 mg | MEDICATED_PATCH | Freq: Every day | TRANSDERMAL | 0 refills | Status: DC

## 2016-12-29 MED ORDER — ASPIRIN 81 MG PO TABS: 81.0000 mg | ORAL_TABLET | Freq: Every day | ORAL | Status: DC

## 2016-12-29 MED ORDER — LISINOPRIL 5 MG PO TABS: 5.0000 mg | ORAL_TABLET | Freq: Every day | ORAL | 6 refills | Status: DC

## 2016-12-29 MED ORDER — EZETIMIBE 10 MG PO TABS: 10.0000 mg | ORAL_TABLET | Freq: Every day | ORAL | 6 refills | Status: DC

## 2016-12-29 MED ORDER — ANGIOPLASTY BOOK
Freq: Once | Status: AC
  Administered 2016-12-29: 06:00:00
  Filled 2016-12-29: qty 1

## 2016-12-29 MED ORDER — ISOSORBIDE MONONITRATE ER 30 MG PO TB24: 30.0000 mg | ORAL_TABLET | Freq: Every day | ORAL | 6 refills | Status: DC

## 2016-12-29 MED ORDER — NITROGLYCERIN 0.4 MG SL SUBL: 0.4000 mg | SUBLINGUAL_TABLET | SUBLINGUAL | 3 refills | Status: DC | PRN

## 2016-12-29 MED ORDER — TICAGRELOR 90 MG PO TABS: 90.0000 mg | ORAL_TABLET | Freq: Two times a day (BID) | ORAL | 6 refills | Status: DC

## 2016-12-29 NOTE — Progress Notes (Signed)
TR BAND REMOVAL  LOCATION:  right radial  DEFLATED PER PROTOCOL:  Yes.    TIME BAND OFF / DRESSING APPLIED:   1500   SITE UPON ARRIVAL:   Level 0  SITE AFTER BAND REMOVAL:  Level 0  CIRCULATION SENSATION AND MOVEMENT:  Within Normal Limits  Yes.    COMMENTS:  Removed at 1500 on 12/28/16

## 2016-12-29 NOTE — Discharge Instructions (Signed)
**PLEASE REMEMBER TO BRING ALL OF YOUR MEDICATIONS TO EACH OF YOUR FOLLOW-UP OFFICE VISITS.  NO HEAVY LIFTING OR SEXUAL ACTIVITY X 7 DAYS. NO DRIVING X 3-5 DAYS. NO SOAKING BATHS, HOT TUBS, POOLS, ETC., X 7 DAYS.  Groin Site Care Refer to this sheet in the next few weeks. These instructions provide you with information on caring for yourself after your procedure. Your caregiver may also give you more specific instructions. Your treatment has been planned according to current medical practices, but problems sometimes occur. Call your caregiver if you have any problems or questions after your procedure. HOME CARE INSTRUCTIONS  You may shower 24 hours after the procedure. Remove the bandage (dressing) and gently wash the site with plain soap and water. Gently pat the site dry.   Do not apply powder or lotion to the site.   Do not sit in a bathtub, swimming pool, or whirlpool for 5 to 7 days.   No bending, squatting, or lifting anything over 10 pounds (4.5 kg) as directed by your caregiver.   Inspect the site at least twice daily.   Do not drive home if you are discharged the same day of the procedure. Have someone else drive you.    Radial Site Care Refer to this sheet in the next few weeks. These instructions provide you with information on caring for yourself after your procedure. Your caregiver may also give you more specific instructions. Your treatment has been planned according to current medical practices, but problems sometimes occur. Call your caregiver if you have any problems or questions after your procedure. HOME CARE INSTRUCTIONS  You may shower the day after the procedure.Remove the bandage (dressing) and gently wash the site with plain soap and water.Gently pat the site dry.   Do not apply powder or lotion to the site.   Do not submerge the affected site in water for 3 to 5 days.   Inspect the site at least twice daily.   Do not flex or bend the affected arm for 24  hours.   No lifting over 5 pounds (2.3 kg) for 5 days after your procedure.   What to expect:  Any bruising will usually fade within 1 to 2 weeks.   Blood that collects in the tissue (hematoma) may be painful to the touch. It should usually decrease in size and tenderness within 1 to 2 weeks.  SEEK IMMEDIATE MEDICAL CARE IF:  You have unusual pain at the radial site.   You have redness, warmth, swelling, or pain at the radial site.   You have drainage (other than a small amount of blood on the dressing).   You have chills.   You have a fever or persistent symptoms for more than 72 hours.   You have a fever and your symptoms suddenly get worse.   Your arm becomes pale, cool, tingly, or numb.   You have heavy bleeding from the site. Hold pressure on the site.      10 Habits of Highly Healthy [place] wants to help you get well and stay well.  Live a longer, healthier life by practicing healthy habits every day.  1.  Visit your primary care provider regularly. 2.  Make time for family and friends.  Healthy relationships are important. 3.  Take medications as directed by your provider. 4.  Maintain a healthy weight and a trim waistline. 5.  Eat healthy meals and snacks, rich in fruits, vegetables, whole grains, and lean proteins.  6.  Get moving every day - aim for 150 minutes of moderate physical activity each week. 7.  Don't smoke. 8.  Avoid alcohol or drink in moderation. 9.  Manage stress through meditation or mindful relaxation. 10.  Get seven to nine hours of quality sleep each night.  Want more information on healthy habits?  To learn more about these and other healthy habits, visit [email or web address]. _____________

## 2017-01-01 ENCOUNTER — Encounter (HOSPITAL_COMMUNITY): Payer: Self-pay | Admitting: Cardiovascular Disease

## 2017-01-01 NOTE — Telephone Encounter (Signed)
Patient contacted regarding discharge from [facility] on 12/29/2016.[name]  Patient understands to follow up with provider Lyda Jester on 01/07/2017 at 9:30 am at [facility].. Patient understands discharge instructions? Yes. Patient understands medications and regiment? Yes. Patient understands to bring all medications to this visit? Yes.  Pt thanked me for my call.

## 2017-01-07 ENCOUNTER — Ambulatory Visit: Payer: Medicare Other | Admitting: Cardiology

## 2017-01-08 ENCOUNTER — Encounter: Payer: Self-pay | Admitting: Cardiology

## 2017-01-15 ENCOUNTER — Encounter: Payer: Self-pay | Admitting: Cardiology

## 2017-01-15 ENCOUNTER — Ambulatory Visit (INDEPENDENT_AMBULATORY_CARE_PROVIDER_SITE_OTHER): Payer: Medicare Other | Admitting: Cardiology

## 2017-01-15 ENCOUNTER — Telehealth: Payer: Self-pay | Admitting: *Deleted

## 2017-01-15 VITALS — BP 142/82 | HR 72 | Ht 71.0 in | Wt 216.0 lb

## 2017-01-15 DIAGNOSIS — I209 Angina pectoris, unspecified: Secondary | ICD-10-CM

## 2017-01-15 DIAGNOSIS — I25111 Atherosclerotic heart disease of native coronary artery with angina pectoris with documented spasm: Secondary | ICD-10-CM

## 2017-01-15 DIAGNOSIS — I200 Unstable angina: Secondary | ICD-10-CM

## 2017-01-15 DIAGNOSIS — R944 Abnormal results of kidney function studies: Secondary | ICD-10-CM | POA: Diagnosis not present

## 2017-01-15 DIAGNOSIS — Z5181 Encounter for therapeutic drug level monitoring: Secondary | ICD-10-CM

## 2017-01-15 LAB — BASIC METABOLIC PANEL
BUN/Creatinine Ratio: 12 (ref 10–24)
BUN: 15 mg/dL (ref 8–27)
CO2: 22 mmol/L (ref 20–29)
CREATININE: 1.23 mg/dL (ref 0.76–1.27)
Calcium: 10 mg/dL (ref 8.6–10.2)
Chloride: 100 mmol/L (ref 96–106)
GFR, EST AFRICAN AMERICAN: 70 mL/min/{1.73_m2} (ref >59)
GFR, EST NON AFRICAN AMERICAN: 61 mL/min/{1.73_m2} (ref >59)
Glucose: 93 mg/dL (ref 65–99)
Potassium: 4.7 mmol/L (ref 3.5–5.2)
SODIUM: 138 mmol/L (ref 134–144)

## 2017-01-15 MED ORDER — LISINOPRIL 5 MG PO TABS: 5.0000 mg | ORAL_TABLET | Freq: Every day | ORAL | 6 refills | Status: DC

## 2017-01-15 MED ORDER — EZETIMIBE 10 MG PO TABS: 10.0000 mg | ORAL_TABLET | Freq: Every day | ORAL | 6 refills | Status: DC

## 2017-01-15 MED ORDER — TICAGRELOR 90 MG PO TABS: 90.0000 mg | ORAL_TABLET | Freq: Two times a day (BID) | ORAL | 6 refills | Status: DC

## 2017-01-15 MED ORDER — METOPROLOL TARTRATE 25 MG PO TABS: 25.0000 mg | ORAL_TABLET | Freq: Two times a day (BID) | ORAL | 3 refills | Status: DC

## 2017-01-15 MED ORDER — ISOSORBIDE MONONITRATE ER 30 MG PO TB24: 30.0000 mg | ORAL_TABLET | Freq: Every day | ORAL | 3 refills | Status: DC

## 2017-01-15 NOTE — Telephone Encounter (Signed)
Medication samples have been provided to the patient.  Drug name: Brilinta 90mg   Qty: 4 bottles of 8 tablets  LOT: [id number]  Exp.Date: 04/2019  Samples left at front desk for patient pick-up. Patient notified by [facility] clinic staff  Sheral Apley M 9:10 AM 01/15/2017

## 2017-01-15 NOTE — Patient Instructions (Signed)
Medication Instructions:  Your physician recommends that you continue on your current medications as directed. Please refer to the Current Medication list given to you today. Your medication refills have been sent to [facility]   Labwork: Your physician recommends that you return for lab work today for [employer]. Come back on 02/26/2017 for a FASTING lipid panel and hepatic function panel.   Testing/Procedures: None Ordered   Follow-Up: Your physician recommends that you schedule a follow-up appointment in: 4 weeks with Dr. Irish Lack or Lyda Jester, PA  Any Other Special Instructions Will Be Listed Below (If Applicable).     If you need a refill on your cardiac medications before your next appointment, please call your pharmacy.  Thank you for choosing [facility]

## 2017-01-15 NOTE — Progress Notes (Signed)
01/15/2017 [name]   1951/04/25  [id number]  Primary Physician [name], MD Primary Cardiologist: Dr. Irish Lack.    Reason for Visit/CC: [facility] F/u for CAD s/p PCI  HPI:  [name] is a 66 y.o. male, followed by Dr. Irish Lack, who presents to clinic today for post hospital f/u after recent admission for [facility].  He has CAD s/p CABG x 3 in 2008 in [place]. He had his sternal rings removed in about 2009. In 10/17, he had a NSTEMI and a DES to the first diagonal in [facility]. He is now followed by Dr. Irish Lack. He was seen recently in clinic 12/2016 and complained of exertional CP, prompting referral for elective [facility]. He underwent LHC with Dr. Claiborne Billings on 12/26/16 , wich showed patent LIMA to an OM, and RIMA to a diag. There was "kink" in the mid portion of the RIMA that improved with intracoronary ntg.  There was worsening of distal LM/osital LAD dzs from previous cath in 10/17. Initial plan was for medical therapy, however he continued to have chest pain, thus decision was made to perform PCI. He underwent successful PCI to the 95% distal LM/ostial Leision utilizing a DES. Given his coronary spasms, LA nitrates were also recommended, however pt reports that this was not called into his pharmacy when he was discharged.   He presents to clinic today for f/u. He is here today with his wife. He is currently CP free, but admits to frequent recurrence of angina that feels like indigestion. His symptoms have been quickly relieved with SL NTG. His BP at home has been mildly elevated in the 846K-599J systolic. BP today is 142/82. He reports full medication compliance. He is in the process of applying for medication assistance for Brilinta. He has quit smoking. He has been smoke free x 2 weeks. He denies any post cath complications. No issues with radial and femoral cath access sites.    Also of note, LDL was elevated at 95 mg/dL (h/o statin intolerance, started on Zetia in hospital). Lisinopril  was also added for BP. He will need a f/u BMP today.   Current Meds  Medication Sig  . aspirin 81 MG tablet Take 1 tablet (81 mg total) by mouth daily.  Marland Kitchen ezetimibe (ZETIA) 10 MG tablet Take 1 tablet (10 mg total) by mouth daily.  Marland Kitchen lisinopril (PRINIVIL,ZESTRIL) 5 MG tablet Take 1 tablet (5 mg total) by mouth daily.  . metoprolol tartrate (LOPRESSOR) 25 MG tablet Take 25 mg by mouth 2 (two) times daily.   . nicotine (NICODERM CQ - DOSED IN MG/24 HOURS) 14 mg/24hr patch Place 1 patch (14 mg total) onto the skin daily.  . nitroGLYCERIN (NITROSTAT) 0.4 MG SL tablet Place 1 tablet (0.4 mg total) under the tongue as needed for chest pain. X 3 doses  . ticagrelor (BRILINTA) 90 MG TABS tablet Take 1 tablet (90 mg total) by mouth 2 (two) times daily.   Allergies  Allergen Reactions  . Clopidogrel     Other reaction(s): GI Upset (intolerance) Other reaction(s): GI Upset (intolerance)  . Codeine Other (See Comments)    Other reaction(s): GI Upset (intolerance) Pt not sure  . Hydrocodone-Acetaminophen     Other reaction(s): GI Upset (intolerance)  . Simvastatin     Other reaction(s): GI Upset (intolerance)  . Influenza Vaccines Nausea And Vomiting   Past Medical History:  Diagnosis Date  . Cancer [facility])    history of prostate cancer  . COPD (  chronic obstructive pulmonary disease) ([place])   . Coronary artery disease    a. 2008 s/p CABG x 3 [facility]): LIMA->OM, RIMA->Diag, VG->RPDA;  b. 05/2016 NSTEMI/PCI: DES ->prox LAD and ost diag;  c. 12/2016 Cath/PCI: LM 95d, LAD 95ost (2.75x8 Promus Premier DES), patent prox stent, D2 30, LCX nl, OM2 95, RCA min irregs, LIMA->OM2 ok, RIMA->D2 85 "kink"-improved w/ IC NTG, VG->RPDA 100.  Marland Kitchen GERD (gastroesophageal reflux disease)   . Hyperlipidemia   . Hypertension   . Tobacco abuse    Family History  Problem Relation Age of Onset  . Kidney failure Mother   . Hypertension Sister   . Hypertension Brother   . Hypertension Brother    Past Surgical  History:  Procedure Laterality Date  . ABDOMINAL AORTOGRAM N/A 12/26/2016   Procedure: Abdominal Aortogram;  Surgeon: Troy Sine, MD;  Location: [facility];  Service: Cardiovascular;  Laterality: N/A;  . BACK SURGERY  2009  . CORONARY ANGIOGRAPHY N/A 12/28/2016   Procedure: Coronary Angiography;  Surgeon: Burnell Blanks, MD;  Location: [facility];  Service: Cardiovascular;  Laterality: N/A;  . CORONARY ARTERY BYPASS GRAFT  2008  . CORONARY ATHERECTOMY N/A 12/28/2016   Procedure: Coronary Atherectomy;  Surgeon: Burnell Blanks, MD;  Location: [place] CV [facility];  Service: Cardiovascular;  Laterality: N/A;  . CORONARY STENT INTERVENTION N/A 12/28/2016   Procedure: Coronary Stent Intervention;  Surgeon: Burnell Blanks, MD;  Location: [place] CV LAB;  Service: Cardiovascular;  Laterality: N/A;  . LEFT HEART CATH Left 05/07/2016   PR CATH PLACE/CORON ANGIO, IMG SUPER/TERP, W  [facility] CATH; SEVICE: CARDIOLOGY  . LEFT HEART CATH AND CORS/GRAFTS ANGIOGRAPHY N/A 12/26/2016   Procedure: Left Heart Cath and Cors/Grafts Angiography;  Surgeon: Troy Sine, MD;  Location: [place] LAB;  Service: Cardiovascular;  Laterality: N/A;  . LEFT HEART VENTRICULOGRAPHY Left 05/14/2016   PR CATH PLACE/ CORON ANGIO, IMG SUPER/ INTERP, W   . TOTAL HIP ARTHROPLASTY Right 2013   Social History   Social History  . Marital status: Married    Spouse name: N/A  . Number of children: N/A  . Years of education: N/A   Occupational History  . Not on file.   Social History Main Topics  . Smoking status: Former Smoker    Packs/day: 0.50    Years: 56.00    Quit date: 05/11/2016  . Smokeless tobacco: Never Used     Comment: Refused  . Alcohol use No  . Drug use: Yes  . Sexual activity: Yes    Birth control/ protection: Other-see comments   Other Topics Concern  . Not on file   Social History Narrative  . No narrative on file     Review of  Systems: General: negative for chills, fever, night sweats or weight changes.  Cardiovascular: negative for chest pain, dyspnea on exertion, edema, orthopnea, palpitations, paroxysmal nocturnal dyspnea or shortness of breath Dermatological: negative for rash Respiratory: negative for cough or wheezing Urologic: negative for hematuria Abdominal: negative for nausea, vomiting, diarrhea, bright red blood per rectum, melena, or hematemesis Neurologic: negative for visual changes, syncope, or dizziness All other systems reviewed and are otherwise negative except as noted above.   Physical Exam:  Blood pressure (!) 142/82, pulse 72, height 5\' 11"  (1.803 m), weight 216 lb (98 kg).  General appearance: alert, cooperative and no distress Neck: no carotid bruit and no JVD Lungs: clear to auscultation bilaterally Heart: regular rate and  rhythm, S1, S2 normal, no murmur, click, rub or gallop Extremities: extremities normal, atraumatic, no cyanosis or edema Pulses: 2+ and symmetric Skin: Skin color, texture, turgor normal. No rashes or lesions Neurologic: Grossly normal  EKG Not performed -- personally reviewed   ASSESSMENT AND PLAN:   1. CAD: s/p CABG x 3 in 2008 in [facility], PCI + DES to diagonal in 2017. Recent PCI + DES to distal LM/prox LAD 12/2016. Also noted to have spasms during cath. LA nitrate was recommended but not called into his  Pharmacy. We will Add Imdur 30 mg daily. F/u in 4 weeks for repeat assessment. Continue DAPT with ASA + Brilitna, Metoprolol and Lisinopril. He was given addition samples of Brilinta. He has filled out medication assistance form and our office will fax today. He was instructed to notify us if he dose not qualify and if he needs additional medications before running out. If he is not approved, we will need to switch to Plavix.   2. HTN: mildly elevated in the 628M systolic. Systolic BPs have been in the 150s at home. Given residual chest pain/ likely spasms,  we ill add Imdur 30 mg. F/u in 4 weeks.   3. HLD: LDL 95 mg on recent lipid panel. Intolerant to statins. Now on Zetia. Recheck FLP and HFTs in 6 weeks. Goal LDL <70 mg/dL. If not at goal, will need to consider referral to lipid clinic.   4. Tobacco Abuse: pt recently quit and has been smoke free x 2 weeks. He was congratulated on his efforts and encouraged to continue to refrain for further use.   Follow-Up with Myself or Dr. Irish Lack in 4 weeks.   Diezel Mazur Ladoris Gene, MHS [facility] 01/15/2017 8:29 AM

## 2017-01-25 DIAGNOSIS — I10 Essential (primary) hypertension: Secondary | ICD-10-CM | POA: Diagnosis not present

## 2017-01-25 DIAGNOSIS — R072 Precordial pain: Secondary | ICD-10-CM | POA: Diagnosis not present

## 2017-01-25 DIAGNOSIS — Z87891 Personal history of nicotine dependence: Secondary | ICD-10-CM | POA: Diagnosis not present

## 2017-01-25 DIAGNOSIS — E785 Hyperlipidemia, unspecified: Secondary | ICD-10-CM | POA: Diagnosis not present

## 2017-01-25 DIAGNOSIS — F17210 Nicotine dependence, cigarettes, uncomplicated: Secondary | ICD-10-CM | POA: Diagnosis not present

## 2017-01-25 DIAGNOSIS — I252 Old myocardial infarction: Secondary | ICD-10-CM | POA: Diagnosis not present

## 2017-01-25 DIAGNOSIS — J449 Chronic obstructive pulmonary disease, unspecified: Secondary | ICD-10-CM | POA: Diagnosis not present

## 2017-01-25 DIAGNOSIS — Z951 Presence of aortocoronary bypass graft: Secondary | ICD-10-CM | POA: Diagnosis not present

## 2017-01-25 DIAGNOSIS — Z7982 Long term (current) use of aspirin: Secondary | ICD-10-CM | POA: Diagnosis not present

## 2017-01-25 DIAGNOSIS — I2510 Atherosclerotic heart disease of native coronary artery without angina pectoris: Secondary | ICD-10-CM | POA: Diagnosis not present

## 2017-01-25 DIAGNOSIS — R079 Chest pain, unspecified: Secondary | ICD-10-CM | POA: Diagnosis not present

## 2017-01-25 DIAGNOSIS — R0602 Shortness of breath: Secondary | ICD-10-CM | POA: Diagnosis not present

## 2017-01-25 DIAGNOSIS — Z9119 Patient's noncompliance with other medical treatment and regimen: Secondary | ICD-10-CM | POA: Diagnosis not present

## 2017-01-26 DIAGNOSIS — I10 Essential (primary) hypertension: Secondary | ICD-10-CM | POA: Diagnosis not present

## 2017-01-26 DIAGNOSIS — E785 Hyperlipidemia, unspecified: Secondary | ICD-10-CM | POA: Diagnosis not present

## 2017-01-26 DIAGNOSIS — R079 Chest pain, unspecified: Secondary | ICD-10-CM | POA: Diagnosis not present

## 2017-01-26 DIAGNOSIS — R0602 Shortness of breath: Secondary | ICD-10-CM | POA: Diagnosis not present

## 2017-01-26 DIAGNOSIS — Z720 Tobacco use: Secondary | ICD-10-CM | POA: Diagnosis not present

## 2017-01-27 ENCOUNTER — Encounter: Payer: Self-pay | Admitting: Physician Assistant

## 2017-01-27 DIAGNOSIS — I10 Essential (primary) hypertension: Secondary | ICD-10-CM | POA: Diagnosis not present

## 2017-01-27 DIAGNOSIS — E785 Hyperlipidemia, unspecified: Secondary | ICD-10-CM | POA: Diagnosis not present

## 2017-01-27 DIAGNOSIS — Z720 Tobacco use: Secondary | ICD-10-CM | POA: Diagnosis not present

## 2017-01-27 DIAGNOSIS — R079 Chest pain, unspecified: Secondary | ICD-10-CM | POA: Diagnosis not present

## 2017-01-28 ENCOUNTER — Encounter (HOSPITAL_COMMUNITY): Payer: Self-pay | Admitting: General Practice

## 2017-01-28 ENCOUNTER — Observation Stay (HOSPITAL_COMMUNITY)
Admission: AD | Admit: 2017-01-28 | Discharge: 2017-01-29 | Disposition: A | Discharge status: Home / Self Care | Payer: Medicare Other | Source: Other Acute Inpatient Hospital | Attending: Cardiovascular Disease | Admitting: Cardiovascular Disease

## 2017-01-28 DIAGNOSIS — I2510 Atherosclerotic heart disease of native coronary artery without angina pectoris: Secondary | ICD-10-CM | POA: Diagnosis not present

## 2017-01-28 DIAGNOSIS — Z87891 Personal history of nicotine dependence: Secondary | ICD-10-CM | POA: Insufficient documentation

## 2017-01-28 DIAGNOSIS — Z951 Presence of aortocoronary bypass graft: Secondary | ICD-10-CM | POA: Diagnosis not present

## 2017-01-28 DIAGNOSIS — J449 Chronic obstructive pulmonary disease, unspecified: Secondary | ICD-10-CM | POA: Insufficient documentation

## 2017-01-28 DIAGNOSIS — Z79899 Other long term (current) drug therapy: Secondary | ICD-10-CM | POA: Insufficient documentation

## 2017-01-28 DIAGNOSIS — Z7982 Long term (current) use of aspirin: Secondary | ICD-10-CM | POA: Insufficient documentation

## 2017-01-28 DIAGNOSIS — I10 Essential (primary) hypertension: Secondary | ICD-10-CM | POA: Insufficient documentation

## 2017-01-28 DIAGNOSIS — R079 Chest pain, unspecified: Secondary | ICD-10-CM | POA: Diagnosis not present

## 2017-01-28 DIAGNOSIS — Z96641 Presence of right artificial hip joint: Secondary | ICD-10-CM | POA: Diagnosis not present

## 2017-01-28 DIAGNOSIS — Z8546 Personal history of malignant neoplasm of prostate: Secondary | ICD-10-CM | POA: Insufficient documentation

## 2017-01-28 LAB — CREATININE, SERUM
Creatinine, Ser: 1.16 mg/dL (ref 0.61–1.24)
GFR calc Af Amer: >60 mL/min (ref >60)
GFR calc non Af Amer: >60 mL/min (ref >60)

## 2017-01-28 LAB — CBC
HCT: 40 % (ref 39.0–52.0)
HEMOGLOBIN: 13.3 g/dL (ref 13.0–17.0)
MCH: 30.6 pg (ref 26.0–34.0)
MCHC: 33.3 g/dL (ref 30.0–36.0)
MCV: 92.2 fL (ref 78.0–100.0)
PLATELETS: 234 10*3/uL (ref 150–400)
RBC: 4.34 MIL/uL (ref 4.22–5.81)
RDW: 13.6 % (ref 11.5–15.5)
WBC: 6.9 10*3/uL (ref 4.0–10.5)

## 2017-01-28 MED ORDER — HEPARIN SODIUM (PORCINE) 5000 UNIT/ML IJ SOLN: 5000.0000 [IU] | Freq: Three times a day (TID) | INTRAMUSCULAR | Status: DC

## 2017-01-28 MED ORDER — ISOSORBIDE MONONITRATE ER 30 MG PO TB24
30.0000 mg | ORAL_TABLET | Freq: Every day | ORAL | Status: DC
  Administered 2017-01-29: 30 mg via ORAL
  Filled 2017-01-28: qty 1

## 2017-01-28 MED ORDER — ACETAMINOPHEN 325 MG PO TABS: 650.0000 mg | ORAL_TABLET | ORAL | Status: DC | PRN

## 2017-01-28 MED ORDER — EZETIMIBE 10 MG PO TABS
10.0000 mg | ORAL_TABLET | Freq: Every day | ORAL | Status: DC
  Administered 2017-01-29: 10 mg via ORAL
  Filled 2017-01-28 (×2): qty 1

## 2017-01-28 MED ORDER — ASPIRIN 81 MG PO CHEW
81.0000 mg | CHEWABLE_TABLET | Freq: Every day | ORAL | Status: DC
  Administered 2017-01-29: 81 mg via ORAL
  Filled 2017-01-28 (×3): qty 1

## 2017-01-28 MED ORDER — PANTOPRAZOLE SODIUM 40 MG PO TBEC
40.0000 mg | DELAYED_RELEASE_TABLET | Freq: Every day | ORAL | Status: DC
  Administered 2017-01-28 – 2017-01-29 (×2): 40 mg via ORAL
  Filled 2017-01-28 (×2): qty 1

## 2017-01-28 MED ORDER — METOPROLOL TARTRATE 25 MG PO TABS
25.0000 mg | ORAL_TABLET | Freq: Two times a day (BID) | ORAL | Status: DC
  Administered 2017-01-28 – 2017-01-29 (×2): 25 mg via ORAL
  Filled 2017-01-28 (×2): qty 1

## 2017-01-28 MED ORDER — ONDANSETRON HCL 4 MG/2ML IJ SOLN
4.0000 mg | Freq: Four times a day (QID) | INTRAMUSCULAR | Status: DC | PRN
  Administered 2017-01-28: 4 mg via INTRAVENOUS
  Filled 2017-01-28 (×2): qty 2

## 2017-01-28 MED ORDER — TICAGRELOR 90 MG PO TABS
90.0000 mg | ORAL_TABLET | Freq: Two times a day (BID) | ORAL | Status: DC
  Administered 2017-01-28 – 2017-01-29 (×2): 90 mg via ORAL
  Filled 2017-01-28 (×2): qty 1

## 2017-01-28 MED ORDER — NITROGLYCERIN 0.4 MG SL SUBL: 0.4000 mg | SUBLINGUAL_TABLET | SUBLINGUAL | Status: DC | PRN

## 2017-01-28 NOTE — H&P (Signed)
History & Physical    Patient ID: [name] MRN: [id number], DOB/AGE: 1951-03-10   Admit date: 01/28/2017   Primary Physician: Jonathan Hipps, MD Primary Cardiologist: [name]  Patient Profile    66 yo male with PMH of CAD s.p CABG (08), NSTEMI ('17) with DES to 1st diag, HTN, HL, and recent cath 5/18 (DES x1 to ostial LAD) who presented to [facility] with chest pain and transferred to [facility] for further work up.  Past Medical History   Past Medical History:  Diagnosis Date  . Cancer [facility])    history of prostate cancer  . COPD (chronic obstructive pulmonary disease) ([place])   . Coronary artery disease    a. 2008 s/p CABG x 3 [facility]): LIMA->OM, RIMA->Diag, VG->RPDA;  b. 05/2016 NSTEMI/PCI: DES ->prox LAD and ost diag;  c. 12/2016 Cath/PCI: LM 95d, LAD 95ost (2.75x8 Promus Premier DES), patent prox stent, D2 30, LCX nl, OM2 95, RCA min irregs, LIMA->OM2 ok, RIMA->D2 85 "kink"-improved w/ IC NTG, VG->RPDA 100.  Marland Kitchen GERD (gastroesophageal reflux disease)   . Hyperlipidemia   . Hypertension   . Mitral regurgitation    a. mod by cath 12/2016.  . Statin intolerance   . Tobacco abuse     Past Surgical History:  Procedure Laterality Date  . ABDOMINAL AORTOGRAM N/A 12/26/2016   Procedure: Abdominal Aortogram;  Surgeon: Jonathan Sine, MD;  Location: [place] CV LAB;  Service: Cardiovascular;  Laterality: N/A;  . BACK SURGERY  2009  . CORONARY ANGIOGRAPHY N/A 12/28/2016   Procedure: Coronary Angiography;  Surgeon: Jonathan Blanks, MD;  Location: [place] CV LAB;  Service: Cardiovascular;  Laterality: N/A;  . CORONARY ARTERY BYPASS GRAFT  2008  . CORONARY ATHERECTOMY N/A 12/28/2016   Procedure: Coronary Atherectomy;  Surgeon: Jonathan Blanks, MD;  Location: [place] CV LAB;  Service: Cardiovascular;  Laterality: N/A;  . CORONARY STENT INTERVENTION N/A 12/28/2016   Procedure: Coronary Stent Intervention;  Surgeon: Jonathan Blanks, MD;  Location: [place] CV LAB;  Service: Cardiovascular;  Laterality: N/A;  . LEFT HEART CATH Left 05/07/2016   PR CATH PLACE/CORON ANGIO, IMG SUPER/TERP, W  [name] CATH; SEVICE: CARDIOLOGY  . LEFT HEART CATH AND CORS/GRAFTS ANGIOGRAPHY N/A 12/26/2016   Procedure: Left Heart Cath and Cors/Grafts Angiography;  Surgeon: Jonathan Sine, MD;  Location: [place] CV LAB;  Service: Cardiovascular;  Laterality: N/A;  . LEFT HEART VENTRICULOGRAPHY Left 05/14/2016   PR CATH PLACE/ CORON ANGIO, IMG SUPER/ INTERP, W   . TOTAL HIP ARTHROPLASTY Right 2013     Allergies  Allergies  Allergen Reactions  . Clopidogrel Other (See Comments)    Other reaction(s): GI Upset (intolerance) Other reaction(s): GI Upset (intolerance)  . Codeine Other (See Comments)    Other reaction(s): GI Upset (intolerance) Pt not sure  . Hydrocodone-Acetaminophen Other (See Comments)    Other reaction(s): GI Upset (intolerance)  . Simvastatin Other (See Comments)    Other reaction(s): GI Upset (intolerance)  . Influenza Vaccines Nausea And Vomiting    History of Present Illness    [name] is a 66 yo male with PMH of CAD s.p CABG (08), NSTEMI ('17) with DES to 1st diag, HTN, HL, and recent cath 5/18 (DES x1 to ostial LAD). Had cath in 10/17 with stent to first diag and only took his medication for a month. Presented as a new pt in 5/18 with Dr. Irish Lack and reported chest pain in the office, and had a  near syncopal episode. Was admitted and underwent LHC with Dr. Claiborne Billings and patient LIMA-->OM and RIMA--> diag with 85% lesion and kink in the mid section. Worsening osital LAD disease that required DES x1 placed by Dr. Angelena Form. It was felt that he was likely having coronary vasospasms from "kink" in the [place] and placed on LA nitrates.   Was seen in the office on 01/15/17 by Lyda Jester and reported doing well. Stated he was fully compliant with his DAPT, and was awaiting assistance for his Brilinta. Stated he had stopped smoking. Planned  f/u in 4 weeks.   Reports he has been in his usual state of health since that time, until Friday afternoon. 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 Took 4 SL nitro which helped with the pressure. Presented to [facility].   There trops were negative x3. Underwent a lexiscan myoview that showed no evidence of ischemia, but had chest pain reported during the test. Continue to have intermittent episodes of chest pain throughout admission that were fairly atypical in nature. Had an elevated Ddimer, but CTA was negative for PE. Given recent stent placement, cardiology was consulted at [facility] and agreed to transfer for further work up.   Home Medications    Prior to Admission medications   Medication Sig Start Date End Date Taking? Authorizing Provider  aspirin 81 MG tablet Take 1 tablet (81 mg total) by mouth daily. 12/29/16  Yes Rogelia Mire, NP  ezetimibe (ZETIA) 10 MG tablet Take 1 tablet (10 mg total) by mouth daily. 01/15/17 01/15/18 Yes Simmons, Brittainy M, PA-C  isosorbide mononitrate (IMDUR) 30 MG 24 hr tablet Take 1 tablet (30 mg total) by mouth daily. 01/15/17 04/15/17 Yes Simmons, Brittainy M, PA-C  lisinopril (PRINIVIL,ZESTRIL) 5 MG tablet Take 1 tablet (5 mg total) by mouth daily. 01/15/17  Yes Lyda Jester M, PA-C  metoprolol tartrate (LOPRESSOR) 25 MG tablet Take 1 tablet (25 mg total) by mouth 2 (two) times daily. 01/15/17  Yes Lyda Jester M, PA-C  nitroGLYCERIN (NITROSTAT) 0.4 MG SL tablet Place 1 tablet (0.4 mg total) under the tongue as needed for chest pain. X 3 doses 12/29/16  Yes Rogelia Mire, NP  ticagrelor (BRILINTA) 90 MG TABS tablet Take 1 tablet (90 mg total) by mouth 2 (two) times daily. 01/15/17  Yes Simmons, Brittainy M, PA-C  nicotine (NICODERM CQ - DOSED IN MG/24 HOURS) 14 mg/24hr patch Place 1 patch (14 mg total) onto the skin daily. Patient not taking: Reported on 01/28/2017  12/29/16   Rogelia Mire, NP    Family History     Family History  Problem Relation Age of Onset  . Kidney failure Mother   . Hypertension Sister   . Hypertension Brother   . Hypertension Brother     Social History    Social History   Social History  . Marital status: Married    Spouse name: N/A  . Number of children: N/A  . Years of education: N/A   Occupational History  . Not on file.   Social History Main Topics  . Smoking status: Current Some Day Smoker    Packs/day: 0.50    Years: 56.00    Last attempt to quit: 05/11/2016  . Smokeless tobacco: Never Used     Comment: Refused  . Alcohol use No  . Drug use: Yes  . Sexual activity: Yes    Birth control/ protection: Other-see  comments   Other Topics Concern  . Not on file   Social History Narrative  . No narrative on file     Review of Systems    See HPI  All other systems reviewed and are otherwise negative except as noted above.  Physical Exam    Blood pressure 139/67, temperature 98.5 F (36.9 C), temperature source Oral, resp. rate 18, height 5\' 11"  (1.803 m), weight 211 lb 3.2 oz (95.8 kg), SpO2 98 %.  General: Pleasant older WM, NAD Psych: Normal affect. Neuro: Alert and oriented X 3. Moves all extremities spontaneously. HEENT: Normal  Neck: Supple without bruits or JVD. Lungs:  Resp regular and unlabored, CTA. Heart: RRR no s3, s4, or murmurs. Abdomen: Soft, non-tender, non-distended, BS + x 4.  Extremities: No clubbing, cyanosis or edema. DP/PT/Radials 2+ and equal bilaterally.  Labs    Troponin (Point of Care Test) No results for input(s): TROPIPOC in the last 72 hours. No results for input(s): CKTOTAL, CKMB, TROPONINI in the last 72 hours. Lab Results  Component Value Date   WBC 9.7 12/28/2016   HGB 12.8 (L) 12/28/2016   HCT 38.3 (L) 12/28/2016   MCV 91.2 12/28/2016   PLT 207 12/28/2016   No results for input(s): NA, K, CL, CO2, BUN, CREATININE, CALCIUM, PROT, BILITOT,  ALKPHOS, ALT, AST, GLUCOSE in the last 168 hours.  Invalid input(s): LABALBU Lab Results  Component Value Date   CHOL 169 12/27/2016   HDL 37 (L) 12/27/2016   LDLCALC 95 12/27/2016   TRIG 187 (H) 12/27/2016   No results found for: [facility]   Radiology Studies    No results found.  ECG & Cardiac Imaging    EKG: SR without acute ST/T wave changes  Cath: 12/26/16  Diagnostic Diagram      12/28/16  Diagnostic Diagram       Post-Intervention Diagram          Assessment & Plan    66 yo male with PMH of CAD s.p CABG (08), NSTEMI ('17) with DES to 1st diag, HTN, HL, and recent cath 5/18 (DES x1 to ostial LAD) who presented to [facility] with chest pain and transferred to [facility] for further work up.  1. Chest pain: Troponins were neg x3, and had a normal stress test without ischemia. Continues to have this intermittent sharp stabbing chest pain that lasts only a couple of seconds and is then gone. Pain is a located LUQ and radiates around to the mid back. No pain with inspiration. Reports this is a different pain than when he had his stent a few weeks ago.  -- unsure whether repeat cath is indicated at this time. Defer to decision to attending. Will make NPO in event that he is to undergo cath. Suspect this may be non cardiac chest pain.   2. CAD s/p CABG/stenting: Reports being compliant with DAPT ASA/Brilinta. Had been approved for Brilinta assistance and recently received 3 month supply. -- continue ASA/Brilinta, BB, Imdur   3. HTN: Stable with current therapy  4. HL: statin intolerant, placed on zetia last admission.   5. Tobacco Use: Reports that he quit smoking about 2 weeks ago  6. Cough: Reports this started about a week ago. Recently started on Lisinopril. Will hold for now.  -- may need to change to ARB  Signed, Reino Bellis, NP-C Pager [phone] 01/28/2017, 3:54 PM  Patient seen, examined. Available data reviewed. Agree with findings, assessment, and plan  as outlined by Reino Bellis, NP-C.  My Exam: Vitals:   01/28/17 1438 01/28/17 1644  BP: 139/67 134/69  Pulse:  68  Resp: 18 20  Temp: 98.5 F (36.9 C) 98 F (36.7 C)   Pt is alert and oriented, NAD HEENT: normal Neck: JVP - normal, carotids 2+= without bruits Lungs: scattered rhonchi bilaterally CV: RRR without murmur or gallop Abd: soft, NT, Positive BS, no hepatomegaly Ext: no C/C/E, distal pulses intact and equal Skin: warm/dry no rash  Outside records reviewed: EKG shows NSR within normal limits, troponin and BNP negative. D-dimer elevated but hx of the same with negative CTA chest in March 2018. Pharmacologic nuclear stress test shows normal perfusion and normal LVEF.   Pt's symptoms are atypical and there is no objective evidence of ischemia. Plan repeat labs in am (troponin) and if negative provide reassurance and discharge home. I don't think further noninvasive testing will be beneficial. Consider empiric PPI.  Sherren Mocha, M.D. 01/28/2017 6:49 PM

## 2017-01-29 DIAGNOSIS — I25708 Atherosclerosis of coronary artery bypass graft(s), unspecified, with other forms of angina pectoris: Secondary | ICD-10-CM | POA: Diagnosis not present

## 2017-01-29 DIAGNOSIS — I10 Essential (primary) hypertension: Secondary | ICD-10-CM | POA: Diagnosis not present

## 2017-01-29 DIAGNOSIS — J449 Chronic obstructive pulmonary disease, unspecified: Secondary | ICD-10-CM | POA: Diagnosis not present

## 2017-01-29 DIAGNOSIS — R079 Chest pain, unspecified: Secondary | ICD-10-CM | POA: Diagnosis not present

## 2017-01-29 DIAGNOSIS — J441 Chronic obstructive pulmonary disease with (acute) exacerbation: Secondary | ICD-10-CM

## 2017-01-29 DIAGNOSIS — E782 Mixed hyperlipidemia: Secondary | ICD-10-CM | POA: Diagnosis not present

## 2017-01-29 DIAGNOSIS — Z79899 Other long term (current) drug therapy: Secondary | ICD-10-CM | POA: Diagnosis not present

## 2017-01-29 DIAGNOSIS — I2510 Atherosclerotic heart disease of native coronary artery without angina pectoris: Secondary | ICD-10-CM | POA: Diagnosis not present

## 2017-01-29 DIAGNOSIS — R05 Cough: Secondary | ICD-10-CM

## 2017-01-29 DIAGNOSIS — Z7982 Long term (current) use of aspirin: Secondary | ICD-10-CM | POA: Diagnosis not present

## 2017-01-29 LAB — LIPID PANEL
Cholesterol: 151 mg/dL (ref 0–200)
HDL: 37 mg/dL — AB (ref >40)
LDL CALC: 97 mg/dL (ref 0–99)
TRIGLYCERIDES: 83 mg/dL (ref <150)
Total CHOL/HDL Ratio: 4.1 RATIO
VLDL: 17 mg/dL (ref 0–40)

## 2017-01-29 LAB — BASIC METABOLIC PANEL
ANION GAP: 5 (ref 5–15)
BUN: 13 mg/dL (ref 6–20)
CO2: 25 mmol/L (ref 22–32)
Calcium: 8.9 mg/dL (ref 8.9–10.3)
Chloride: 107 mmol/L (ref 101–111)
Creatinine, Ser: 1.23 mg/dL (ref 0.61–1.24)
GFR calc Af Amer: >60 mL/min (ref >60)
GFR calc non Af Amer: 59 mL/min — ABNORMAL LOW (ref >60)
GLUCOSE: 99 mg/dL (ref 65–99)
Potassium: 4.3 mmol/L (ref 3.5–5.1)
Sodium: 137 mmol/L (ref 135–145)

## 2017-01-29 LAB — TROPONIN I: Troponin I: <0.03 ng/mL (ref <0.03)

## 2017-01-29 MED ORDER — PANTOPRAZOLE SODIUM 20 MG PO TBEC: 20.0000 mg | DELAYED_RELEASE_TABLET | Freq: Every day | ORAL | 1 refills | Status: DC

## 2017-01-29 MED ORDER — PREDNISONE 20 MG PO TABS: 20.0000 mg | ORAL_TABLET | Freq: Every day | ORAL | 0 refills | Status: DC

## 2017-01-29 MED ORDER — PREDNISONE 20 MG PO TABS
40.0000 mg | ORAL_TABLET | Freq: Once | ORAL | Status: AC
  Administered 2017-01-29: 40 mg via ORAL
  Filled 2017-01-29: qty 2

## 2017-01-29 NOTE — Progress Notes (Addendum)
Patient Name: [name] Date of Encounter: 01/29/2017  Active Problems:   Chest pain   Length of Stay: 1  SUBJECTIVE  The patient continues to cough - dry cough, nonproductive, chest pain associated only with cough not with exertion, currently no chest pain.   CURRENT MEDS . aspirin  81 mg Oral Daily  . ezetimibe  10 mg Oral Daily  . heparin  5,000 Units Subcutaneous Q8H  . isosorbide mononitrate  30 mg Oral Daily  . metoprolol tartrate  25 mg Oral BID  . pantoprazole  40 mg Oral Daily  . ticagrelor  90 mg Oral BID    OBJECTIVE  Vitals:   01/28/17 2300 01/29/17 0030 01/29/17 0440 01/29/17 0743  BP: (!) 108/48 (!) 99/55 128/74 (!) 115/58  Pulse: 63 61  62  Resp: 17 17 16 20   Temp:   98 F (36.7 C) 98.3 F (36.8 C)  TempSrc:   Oral Oral  SpO2:   95% 99%  Weight:   210 lb 5.1 oz (95.4 kg)   Height:       No intake or output data in the 24 hours ending 01/29/17 1008 Filed Weights   01/28/17 1438 01/29/17 0440  Weight: 211 lb 3.2 oz (95.8 kg) 210 lb 5.1 oz (95.4 kg)    PHYSICAL EXAM  General: Pleasant, NAD. Neuro: Alert and oriented X 3. Moves all extremities spontaneously. Psych: Normal affect. HEENT:  Normal  Neck: Supple without bruits or JVD. Lungs:  Resp regular and unlabored, mild end-expiratory wheezing. Heart: RRR no s3, s4, or murmurs. Abdomen: Soft, non-tender, non-distended, BS + x 4.  Extremities: No clubbing, cyanosis or edema. DP/PT/Radials 2+ and equal bilaterally.  Accessory Clinical Findings  CBC  Recent Labs  01/28/17 2222  WBC 6.9  HGB 13.3  HCT 40.0  MCV 92.2  PLT 409   Basic Metabolic Panel  Recent Labs  01/28/17 2222 01/29/17 0426  NA  --  137  K  --  4.3  CL  --  107  CO2  --  25  GLUCOSE  --  99  BUN  --  13  CREATININE 1.16 1.23  CALCIUM  --  8.9    Recent Labs  01/29/17 0426  TROPONINI <0.03   Fasting Lipid Panel  Recent Labs  01/29/17 0426  CHOL 151  HDL 37*  LDLCALC 97  TRIG 83  CHOLHDL 4.1     Radiology/Studies  No results found.  TELE: SR, I episode of short atrial tachycardia     ASSESSMENT AND PLAN  66 yo male with PMH of CAD s.p CABG (08), NSTEMI ('17) with DES to 1st diag, HTN, HL, and recent cath 5/18 (DES x1 to ostial LAD) who presented to [facility] with chest pain and transferred to [facility] for further work up.  1. Chest pain: Troponins were neg x3, and had a normal stress test without ischemia. Continues to have this intermittent sharp stabbing chest pain that lasts only a couple of seconds and is then gone. Pain is a located LUQ and radiates around to the mid back. No pain with inspiration. Reports this is a different pain than when he had his stent a few weeks ago.  -- unsure whether repeat cath is indicated at this time. Defer to decision to attending. Will make NPO in event that he is to undergo cath.  - this is most probably COPD exacerbation, he has mild wheezing. He has no insurance a nd wont be able  to afford inhalors, we will start prednisone taper - today prednisone 40 mg po x 1, then 20 mg po daily x 2 days, then 10 mg po daily x 2 days.  - lisinopril was discontinued, he is rather hypotensive, this should be reevaluated at the follow up appointment and losartan can be added instead - also add PPI - pantoprazole 20 mg po daily - he is strongly advised to quit smoking  2. CAD s/p CABG/stenting: Reports being compliant with DAPT ASA/Brilinta. Had been approved for Brilinta assistance and recently received 3 month supply. -- continue ASA/Brilinta, BB, Imdur   3. HTN: Stable with current therapy  4. HL: statin intolerant, placed on zetia last admission.   5. Tobacco Use: Reports that he quit smoking about 2 weeks ago  6. Cough: Reports this started about a week ago. Recently started on Lisinopril. Will hold for now.  -- may need to change to ARB  He can be discharged today, we will order a case management consult for a possible orange card.    Signed, Ena Dawley MD, [facility] 01/29/2017

## 2017-01-29 NOTE — Care Management Note (Addendum)
Case Management Note  Patient Details  Name: [name] MRN: [id number] Date of Birth: 1950/12/05  Subjective/Objective: Pt presented for Chest Pain. Stress Test negative. Plan to return home with wife. PTA- independent.                    Action/Plan: CM did receive a consult for Medication assistance. CM spoke with pt and he was having difficulty paying for Brilinta. Pt had previously signed up for the Patient Assistance via [employer]. Pt is getting assistance with medications now. No further needs from CM at this time.    Expected Discharge Date:                  Expected Discharge Plan:  Home/Self Care  In-House Referral:  NA  Discharge planning Services  CM Consult, Medication Assistance  Post Acute Care Choice:  NA Choice offered to:  NA  DME Arranged:  N/A DME Agency:  NA  HH Arranged:  NA HH Agency:  NA  Status of Service:  Completed, signed off  If discussed at [place] of Stay Meetings, dates discussed:    Additional Comments: 1119 01-29-17 Jacqlyn Krauss, RN,BSN [phone] CM did speak with pt in regards to Inhalers and affordability. CM did ask RN for assistance with inhalers that pt will d/c on. CM will see if any patient assistance available.  Bethena Roys, RN 01/29/2017, 10:12 AM

## 2017-01-29 NOTE — Discharge Summary (Signed)
Discharge Summary    Patient ID: [name],  MRN: [id number], DOB/AGE: Jan 04, 1951 66 y.o.  Admit date: 01/28/2017 Discharge date: 01/29/2017  Primary Care Provider: [name] Primary Cardiologist: Irish Lack  Discharge Diagnoses    Active Problems:   Chest pain   Allergies Allergies  Allergen Reactions  . Clopidogrel Other (See Comments)    Other reaction(s): GI Upset (intolerance) Other reaction(s): GI Upset (intolerance)  . Codeine Other (See Comments)    Other reaction(s): GI Upset (intolerance) Pt not sure  . Hydrocodone-Acetaminophen Other (See Comments)    Other reaction(s): GI Upset (intolerance)  . Simvastatin Other (See Comments)    Other reaction(s): GI Upset (intolerance)  . Influenza Vaccines Nausea And Vomiting    Diagnostic Studies/Procedures    N/A _____________   History of Present Illness     [name] is a 66 yo male with PMH of CAD s.p CABG (08), NSTEMI ('17) with DES to 1st diag, HTN, HL, and recent cath 5/18 (DES x1 to ostial LAD). Had cath in 10/17 with stent to first diag and only took his medication for a month. Presented as a new pt in 5/18 with Dr. Irish Lack and reported chest pain in the office, and had a near syncopal episode. Was admitted and underwent LHC with Dr. Claiborne Billings and patient LIMA-->OM and RIMA--> diag with 85% lesion and kink in the mid section. Worsening osital LAD disease that required DES x1 placed by Dr. Angelena Form. It was felt that he was likely having coronary vasospasms from "kink" in the [place] and placed on LA nitrates.   Was seen in the office on 01/15/17 by Lyda Jester and reported doing well. Stated he was fully compliant with his DAPT, and was awaiting assistance for his Brilinta. Stated he had stopped smoking. Planned f/u in 4 weeks.   Reports he has been in his usual state of health since that time, until Friday afternoon. 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 Took 4 SL nitro which helped with the pressure. Presented to [facility].   There trops were negative x3. Underwent a lexiscan myoview that showed no evidence of ischemia, but had chest pain reported during the test. Continue to have intermittent episodes of chest pain throughout admission that were fairly atypical in nature. Had an elevated Ddimer, but CTA was negative for PE. Given recent stent placement, cardiology was consulted at [facility] and agreed to transfer for further work up.   Hospital Course     He was seen at [facility] and symptoms noted to atypical with no objective evidence of ischemia noted. Repeat troponin was negative the following morning. It was felt that no further invasive testing was indicated. He was continued on DAPT with ASA/Brilinta. Will hold his lisinopril given cough, blood pressure was also soft this admission. Can consider adding losartan at outpatient follow up appt. Also added Protonix 20mg  daily given symptoms seemed more GI related. Felt to had mild COPD exacerbation, and was started on one week steroid taper prior to discharge.   He was seen by Dr. Meda Coffee and determined stable for discharge home. Follow up in the office has been arranged. Medications are listed below.  _____________  Discharge Vitals Blood pressure (!) 115/58, pulse 62, temperature 98.3 F (36.8 C), temperature source Oral, resp. rate 20, height 5\' 11"  (1.803 m), weight 210 lb 5.1 oz (95.4 kg), SpO2 99 %.  Filed Weights  01/28/17 1438 01/29/17 0440  Weight: 211 lb 3.2 oz (95.8 kg) 210 lb 5.1 oz (95.4 kg)    Labs & Radiologic Studies    CBC  Recent Labs  01/28/17 2222  WBC 6.9  HGB 13.3  HCT 40.0  MCV 92.2  PLT 782   Basic Metabolic Panel  Recent Labs  01/28/17 2222 01/29/17 0426  NA  --  137  K  --  4.3  CL  --  107  CO2  --  25  GLUCOSE  --  99  BUN  --  13  CREATININE 1.16 1.23  CALCIUM  --  8.9   Liver Function Tests No results for input(s):  AST, ALT, ALKPHOS, BILITOT, PROT, ALBUMIN in the last 72 hours. No results for input(s): LIPASE, AMYLASE in the last 72 hours. Cardiac Enzymes  Recent Labs  01/29/17 0426  TROPONINI <0.03   BNP Invalid input(s): POCBNP D-Dimer No results for input(s): DDIMER in the last 72 hours. Hemoglobin A1C No results for input(s): HGBA1C in the last 72 hours. Fasting Lipid Panel  Recent Labs  01/29/17 0426  CHOL 151  HDL 37*  LDLCALC 97  TRIG 83  CHOLHDL 4.1   Thyroid Function Tests No results for input(s): TSH, T4TOTAL, T3FREE, THYROIDAB in the last 72 hours.  Invalid input(s): FREET3 _____________  No results found. Disposition   Pt is being discharged home today in good condition.  Follow-up Plans & Appointments    Follow-up Information    Consuelo Pandy, PA-C Follow up on 02/12/2017.   Specialties:  Cardiology, Radiology Why:  at 800am for your follow up appt Contact information: [address] [place] [address] [phone]          Discharge Instructions    Diet - low sodium heart healthy    Complete by:  As directed    Discharge instructions    Complete by:  As directed    Please take steroid as prescribed over the next week. We have stopped your lisinopril this admission related to your cough. Also added Protonix daily to see if this helps with your pain. You will need to check your blood pressures at home and keep a log to bring to your follow up appt. Continue to take your Brilinta and aspirin without missing doses. Please call with any questions.   Increase activity slowly    Complete by:  As directed       Discharge Medications   Current Discharge Medication List    START taking these medications   Details  pantoprazole (PROTONIX) 20 MG tablet Take 1 tablet (20 mg total) by mouth daily. Qty: 30 tablet, Refills: 1    predniSONE (DELTASONE) 20 MG tablet Take 1 tablet (20 mg total) by mouth daily with breakfast. Take 40mg  for 1  day, then 20mg  for 2 days, then 10mg  for 2 days. Qty: 6 tablet, Refills: 0      CONTINUE these medications which have NOT CHANGED   Details  aspirin 81 MG tablet Take 1 tablet (81 mg total) by mouth daily.    ezetimibe (ZETIA) 10 MG tablet Take 1 tablet (10 mg total) by mouth daily. Qty: 30 tablet, Refills: 6    isosorbide mononitrate (IMDUR) 30 MG 24 hr tablet Take 1 tablet (30 mg total) by mouth daily. Qty: 90 tablet, Refills: 3    metoprolol tartrate (LOPRESSOR) 25 MG tablet Take 1 tablet (25 mg total) by mouth 2 (two) times daily. Qty: 90  tablet, Refills: 3    nitroGLYCERIN (NITROSTAT) 0.4 MG SL tablet Place 1 tablet (0.4 mg total) under the tongue as needed for chest pain. X 3 doses Qty: 25 tablet, Refills: 3    ticagrelor (BRILINTA) 90 MG TABS tablet Take 1 tablet (90 mg total) by mouth 2 (two) times daily. Qty: 60 tablet, Refills: 6    nicotine (NICODERM CQ - DOSED IN MG/24 HOURS) 14 mg/24hr patch Place 1 patch (14 mg total) onto the skin daily. Qty: 30 patch, Refills: 0      STOP taking these medications     lisinopril (PRINIVIL,ZESTRIL) 5 MG tablet          Outstanding Labs/Studies   N/A  Duration of Discharge Encounter   Greater than 30 minutes including physician time.  Signed, Reino Bellis NP-C 01/29/2017, 10:46 AM

## 2017-01-29 NOTE — Care Management Obs Status (Signed)
[place] NOTIFICATION   Patient Details  Name: [name] MRN: [id number] Date of Birth: 23-May-1951   Medicare Observation Status Notification Given:  Yes    Bethena Roys, RN 01/29/2017, 11:19 AM

## 2017-02-03 DIAGNOSIS — R55 Syncope and collapse: Secondary | ICD-10-CM

## 2017-02-03 HISTORY — DX: Syncope and collapse: R55

## 2017-02-12 ENCOUNTER — Emergency Department: Payer: Medicare Other

## 2017-02-12 ENCOUNTER — Ambulatory Visit: Payer: Medicare Other | Admitting: Cardiology

## 2017-02-12 ENCOUNTER — Observation Stay
Admission: EM | Admit: 2017-02-12 | Discharge: 2017-02-14 | Disposition: A | Discharge status: Home / Self Care | Payer: Medicare Other | Attending: Internal Medicine | Admitting: Internal Medicine

## 2017-02-12 ENCOUNTER — Encounter: Payer: Self-pay | Admitting: Emergency Medicine

## 2017-02-12 DIAGNOSIS — Z887 Allergy status to serum and vaccine status: Secondary | ICD-10-CM | POA: Insufficient documentation

## 2017-02-12 DIAGNOSIS — R42 Dizziness and giddiness: Secondary | ICD-10-CM | POA: Diagnosis not present

## 2017-02-12 DIAGNOSIS — S8001XA Contusion of right knee, initial encounter: Secondary | ICD-10-CM | POA: Diagnosis not present

## 2017-02-12 DIAGNOSIS — R0789 Other chest pain: Secondary | ICD-10-CM | POA: Insufficient documentation

## 2017-02-12 DIAGNOSIS — R079 Chest pain, unspecified: Secondary | ICD-10-CM

## 2017-02-12 DIAGNOSIS — Z79899 Other long term (current) drug therapy: Secondary | ICD-10-CM | POA: Insufficient documentation

## 2017-02-12 DIAGNOSIS — Z7982 Long term (current) use of aspirin: Secondary | ICD-10-CM | POA: Insufficient documentation

## 2017-02-12 DIAGNOSIS — I2510 Atherosclerotic heart disease of native coronary artery without angina pectoris: Secondary | ICD-10-CM | POA: Diagnosis not present

## 2017-02-12 DIAGNOSIS — I6503 Occlusion and stenosis of bilateral vertebral arteries: Secondary | ICD-10-CM | POA: Diagnosis not present

## 2017-02-12 DIAGNOSIS — I340 Nonrheumatic mitral (valve) insufficiency: Secondary | ICD-10-CM | POA: Diagnosis not present

## 2017-02-12 DIAGNOSIS — I10 Essential (primary) hypertension: Secondary | ICD-10-CM | POA: Diagnosis not present

## 2017-02-12 DIAGNOSIS — W19XXXA Unspecified fall, initial encounter: Secondary | ICD-10-CM | POA: Insufficient documentation

## 2017-02-12 DIAGNOSIS — R001 Bradycardia, unspecified: Secondary | ICD-10-CM | POA: Insufficient documentation

## 2017-02-12 DIAGNOSIS — Z885 Allergy status to narcotic agent status: Secondary | ICD-10-CM | POA: Insufficient documentation

## 2017-02-12 DIAGNOSIS — K219 Gastro-esophageal reflux disease without esophagitis: Secondary | ICD-10-CM | POA: Insufficient documentation

## 2017-02-12 DIAGNOSIS — I252 Old myocardial infarction: Secondary | ICD-10-CM | POA: Diagnosis not present

## 2017-02-12 DIAGNOSIS — F17210 Nicotine dependence, cigarettes, uncomplicated: Secondary | ICD-10-CM | POA: Diagnosis not present

## 2017-02-12 DIAGNOSIS — Z8546 Personal history of malignant neoplasm of prostate: Secondary | ICD-10-CM | POA: Insufficient documentation

## 2017-02-12 DIAGNOSIS — R55 Syncope and collapse: Secondary | ICD-10-CM | POA: Diagnosis not present

## 2017-02-12 DIAGNOSIS — Z951 Presence of aortocoronary bypass graft: Secondary | ICD-10-CM

## 2017-02-12 DIAGNOSIS — R2681 Unsteadiness on feet: Secondary | ICD-10-CM

## 2017-02-12 DIAGNOSIS — R51 Headache: Secondary | ICD-10-CM | POA: Insufficient documentation

## 2017-02-12 DIAGNOSIS — R112 Nausea with vomiting, unspecified: Secondary | ICD-10-CM | POA: Diagnosis not present

## 2017-02-12 DIAGNOSIS — I25118 Atherosclerotic heart disease of native coronary artery with other forms of angina pectoris: Secondary | ICD-10-CM | POA: Insufficient documentation

## 2017-02-12 DIAGNOSIS — E782 Mixed hyperlipidemia: Secondary | ICD-10-CM | POA: Insufficient documentation

## 2017-02-12 DIAGNOSIS — Z888 Allergy status to other drugs, medicaments and biological substances status: Secondary | ICD-10-CM | POA: Insufficient documentation

## 2017-02-12 DIAGNOSIS — I739 Peripheral vascular disease, unspecified: Secondary | ICD-10-CM | POA: Insufficient documentation

## 2017-02-12 DIAGNOSIS — I6523 Occlusion and stenosis of bilateral carotid arteries: Secondary | ICD-10-CM | POA: Diagnosis not present

## 2017-02-12 DIAGNOSIS — Z9889 Other specified postprocedural states: Secondary | ICD-10-CM | POA: Diagnosis not present

## 2017-02-12 DIAGNOSIS — Z955 Presence of coronary angioplasty implant and graft: Secondary | ICD-10-CM | POA: Insufficient documentation

## 2017-02-12 DIAGNOSIS — J449 Chronic obstructive pulmonary disease, unspecified: Secondary | ICD-10-CM | POA: Insufficient documentation

## 2017-02-12 DIAGNOSIS — I208 Other forms of angina pectoris: Secondary | ICD-10-CM | POA: Diagnosis not present

## 2017-02-12 LAB — BASIC METABOLIC PANEL
Anion gap: 9 (ref 5–15)
BUN: 13 mg/dL (ref 6–20)
CALCIUM: 9 mg/dL (ref 8.9–10.3)
CO2: 22 mmol/L (ref 22–32)
Chloride: 106 mmol/L (ref 101–111)
Creatinine, Ser: 1.21 mg/dL (ref 0.61–1.24)
GFR calc non Af Amer: >60 mL/min (ref >60)
Glucose, Bld: 121 mg/dL — ABNORMAL HIGH (ref 65–99)
Potassium: 4.2 mmol/L (ref 3.5–5.1)
Sodium: 137 mmol/L (ref 135–145)

## 2017-02-12 LAB — CBC WITH DIFFERENTIAL/PLATELET
BASOS PCT: 1 %
Basophils Absolute: 0.1 10*3/uL (ref 0–0.1)
EOS ABS: 0.2 10*3/uL (ref 0–0.7)
EOS PCT: 2 %
HEMATOCRIT: 38.9 % — AB (ref 40.0–52.0)
Hemoglobin: 13.5 g/dL (ref 13.0–18.0)
Lymphocytes Relative: 16 %
Lymphs Abs: 1.7 10*3/uL (ref 1.0–3.6)
MCH: 32 pg (ref 26.0–34.0)
MCHC: 34.6 g/dL (ref 32.0–36.0)
MCV: 92.7 fL (ref 80.0–100.0)
MONO ABS: 0.8 10*3/uL (ref 0.2–1.0)
MONOS PCT: 7 %
NEUTROS ABS: 8.1 10*3/uL — AB (ref 1.4–6.5)
Neutrophils Relative %: 74 %
PLATELETS: 296 10*3/uL (ref 150–440)
RBC: 4.2 MIL/uL — ABNORMAL LOW (ref 4.40–5.90)
RDW: 14.6 % — AB (ref 11.5–14.5)
WBC: 10.9 10*3/uL — ABNORMAL HIGH (ref 3.8–10.6)

## 2017-02-12 LAB — MAGNESIUM: Magnesium: 2.2 mg/dL (ref 1.7–2.4)

## 2017-02-12 LAB — TROPONIN I
Troponin I: <0.03 ng/mL (ref <0.03)
Troponin I: <0.03 ng/mL (ref <0.03)

## 2017-02-12 MED ORDER — ONDANSETRON HCL 4 MG/2ML IJ SOLN
4.0000 mg | Freq: Four times a day (QID) | INTRAMUSCULAR | Status: DC | PRN
  Administered 2017-02-12 – 2017-02-13 (×3): 4 mg via INTRAVENOUS
  Filled 2017-02-12 (×3): qty 2

## 2017-02-12 MED ORDER — NICOTINE 14 MG/24HR TD PT24
14.0000 mg | MEDICATED_PATCH | Freq: Every day | TRANSDERMAL | Status: DC
  Administered 2017-02-12 – 2017-02-14 (×3): 14 mg via TRANSDERMAL
  Filled 2017-02-12 (×3): qty 1

## 2017-02-12 MED ORDER — ONDANSETRON HCL 4 MG/2ML IJ SOLN
4.0000 mg | Freq: Once | INTRAMUSCULAR | Status: AC
  Administered 2017-02-12: 4 mg via INTRAVENOUS
  Filled 2017-02-12: qty 2

## 2017-02-12 MED ORDER — ISOSORBIDE MONONITRATE ER 30 MG PO TB24
30.0000 mg | ORAL_TABLET | Freq: Every day | ORAL | Status: DC
  Administered 2017-02-13 – 2017-02-14 (×2): 30 mg via ORAL
  Filled 2017-02-12 (×2): qty 1

## 2017-02-12 MED ORDER — ACETAMINOPHEN 325 MG PO TABS
650.0000 mg | ORAL_TABLET | Freq: Four times a day (QID) | ORAL | Status: DC | PRN
  Administered 2017-02-12: 650 mg via ORAL
  Filled 2017-02-12 (×2): qty 2

## 2017-02-12 MED ORDER — ENOXAPARIN SODIUM 40 MG/0.4ML ~~LOC~~ SOLN
40.0000 mg | SUBCUTANEOUS | Status: DC
  Administered 2017-02-12 – 2017-02-13 (×2): 40 mg via SUBCUTANEOUS
  Filled 2017-02-12 (×2): qty 0.4

## 2017-02-12 MED ORDER — ACETAMINOPHEN 650 MG RE SUPP: 650.0000 mg | Freq: Four times a day (QID) | RECTAL | Status: DC | PRN

## 2017-02-12 MED ORDER — KETOROLAC TROMETHAMINE 15 MG/ML IJ SOLN
15.0000 mg | Freq: Once | INTRAMUSCULAR | Status: AC
Start: 2017-02-12 — End: 2017-02-12
  Administered 2017-02-12: 15 mg via INTRAVENOUS
  Filled 2017-02-12: qty 1

## 2017-02-12 MED ORDER — EZETIMIBE 10 MG PO TABS
10.0000 mg | ORAL_TABLET | Freq: Every day | ORAL | Status: DC
  Administered 2017-02-13 – 2017-02-14 (×2): 10 mg via ORAL
  Filled 2017-02-12 (×2): qty 1

## 2017-02-12 MED ORDER — NITROGLYCERIN 0.4 MG SL SUBL
0.4000 mg | SUBLINGUAL_TABLET | SUBLINGUAL | Status: DC | PRN
  Administered 2017-02-12: 0.4 mg via SUBLINGUAL
  Filled 2017-02-12: qty 1

## 2017-02-12 MED ORDER — MORPHINE SULFATE (PF) 4 MG/ML IV SOLN
4.0000 mg | Freq: Once | INTRAVENOUS | Status: AC
  Administered 2017-02-12: 4 mg via INTRAVENOUS
  Filled 2017-02-12: qty 1

## 2017-02-12 MED ORDER — TICAGRELOR 90 MG PO TABS
90.0000 mg | ORAL_TABLET | Freq: Two times a day (BID) | ORAL | Status: DC
  Administered 2017-02-12 – 2017-02-14 (×4): 90 mg via ORAL
  Filled 2017-02-12 (×4): qty 1

## 2017-02-12 MED ORDER — ONDANSETRON HCL 4 MG PO TABS: 4.0000 mg | ORAL_TABLET | Freq: Four times a day (QID) | ORAL | Status: DC | PRN

## 2017-02-12 MED ORDER — ASPIRIN 81 MG PO CHEW
81.0000 mg | CHEWABLE_TABLET | Freq: Every day | ORAL | Status: DC
  Administered 2017-02-13 – 2017-02-14 (×2): 81 mg via ORAL
  Filled 2017-02-12 (×2): qty 1

## 2017-02-12 MED ORDER — METOPROLOL TARTRATE 25 MG PO TABS
25.0000 mg | ORAL_TABLET | Freq: Two times a day (BID) | ORAL | Status: DC
  Administered 2017-02-12 – 2017-02-13 (×3): 25 mg via ORAL
  Filled 2017-02-12 (×3): qty 1

## 2017-02-12 MED ORDER — PANTOPRAZOLE SODIUM 20 MG PO TBEC
20.0000 mg | DELAYED_RELEASE_TABLET | Freq: Every day | ORAL | Status: DC
  Filled 2017-02-12: qty 1

## 2017-02-12 NOTE — ED Triage Notes (Signed)
Pt in via EMS from work.  Pt reports feeling dizzy around 0830 today w/ syncopal episode thereafter.  Pt unable to tell whether or not he hit his head.  Pt reports chest pressure since the episode w/ associated shortness of breath; pt with extensive cardiac history.  Pt received 1 Nitro spray and 324 aspirin per EMS.  Vitals WDL.  MD to bedside upon arrival.

## 2017-02-12 NOTE — Consult Note (Signed)
Cardiology Consult    Patient ID: [name] MRN: [id number], DOB/AGE: 67/03/1951   Admit date: 02/12/2017 Date of Consult: 02/12/2017  Primary Physician: Jonathan Hipps, MD Reason for Consult: Syncope; Chest Pain Primary Cardiologist: Dr. Irish [name] Requesting Provider: Dr. Tressia [name]   History of Present Illness    [name] is a 66 y.o. male with past medical history of CAD (s/p CABG in 2008, DES to D1 in 05/2016, cath in 12/2016 showing patent LIMA-2nd Mrg, RIMA -2nd Diag with "kink" in the bend of the graft which resolved with IC NTG, requiring DES to ostial LAD two days later for recurrent anginal symptoms), HTN, HLD,and mitral regurgitation who is being seen today for the evaluation of syncope and chest pain at the request of Dr. Tressia [name].   He was evaluated by Dr. Irish [name] in 12/2016 and reported symptoms concerning for unstable angina. A cardiac catheterization was recommended which showed a patent LIMA-OM and RIMA-D2 with a "kink" along the RIMA which resolved with intracoronary NTG and was suggestive of a coronary vasospasm. He continued to have pain following the procedure, therefore he went back to the cath [name] two days later and underwent PCI of a 95% stenosed ostial LAD with placement of a DES.   He presented back to [name] on 6/25 for recurrent chest pain which he described as a shooting pain only lasting for seconds at a time. A Lexiscan Myoview was performed at that time and normal by review of notes following his transfer to [name]. Cardiac enzymes remained negative and EKG was without acute ischemic changes. He was started on Protonix 20mg  daily as symptoms were thought to be secondary to GERD.   In talking with the patient today, he reports being in his usual state of health over the past few weeks. He has returned to work and denies any exertional anginal symptoms. Around 0430 this AM, he awoke with a stinging pain along his sternum, reporting it felt like  reflux. He sat up for a minute, then was able to go back to sleep. Denies any associated nausea, vomiting, dyspnea, or diaphoresis at that time.   At approximately 0830, he was standing outside of his work truck with coworkers when he developed sudden dizziness and experienced a loss of consciousness. Unsure how long he was out, but estimates it was less than 2 minutes. No associated chest pain, dyspnea, palpitations, or post-ictal symptoms at that time. Did not experience bowel or bladder incontinence.   Initial labs show WBC of 10.9, Hgb 13.5, platelets 296. Na+ 137, K+ 4.2, creatinine 1.21. Mg 2.2. Initial troponin negative. CXR with no active disease. CT Head with no acute abnormalities. EKG shows NSR, HR 75, with no acute ST or T-wave changes.    Past Medical History   Past Medical History:  Diagnosis Date  . Cancer [facility])    history of prostate cancer  . COPD (chronic obstructive pulmonary disease) ([place])   . Coronary artery disease    a. 2008 s/p CABG x 3 [name]): LIMA->OM, RIMA->Diag, VG->RPDA;  b. 05/2016 NSTEMI/PCI: DES ->prox LAD and ost diag;  c. 12/2016 Cath/PCI: LM 95d, LAD 95ost (2.75x8 Promus Premier DES), patent prox stent, D2 30, LCX nl, OM2 95, RCA min irregs, LIMA->OM2 ok, RIMA->D2 85 "kink"-improved w/ IC NTG, VG->RPDA 100.  Marland Kitchen GERD (gastroesophageal reflux disease)   . Hyperlipidemia   . Hypertension   . Mitral regurgitation    a. mod by cath 12/2016.  . Statin intolerance   .  Tobacco abuse     Past Surgical History:  Procedure Laterality Date  . ABDOMINAL AORTOGRAM N/A 12/26/2016   Procedure: Abdominal Aortogram;  Surgeon: Jonathan Sine, MD;  Location: [name];  Service: Cardiovascular;  Laterality: N/A;  . BACK SURGERY  2009  . CORONARY ANGIOGRAPHY N/A 12/28/2016   Procedure: Coronary Angiography;  Surgeon: Jonathan Blanks, MD;  Location: [name];  Service: Cardiovascular;  Laterality: N/A;  . CORONARY ARTERY BYPASS GRAFT  2008    . CORONARY ATHERECTOMY N/A 12/28/2016   Procedure: Coronary Atherectomy;  Surgeon: Jonathan Blanks, MD;  Location: [name];  Service: Cardiovascular;  Laterality: N/A;  . CORONARY STENT INTERVENTION N/A 12/28/2016   Procedure: Coronary Stent Intervention;  Surgeon: Jonathan Blanks, MD;  Location: [name];  Service: Cardiovascular;  Laterality: N/A;  . LEFT HEART CATH Left 05/07/2016   PR CATH PLACE/CORON ANGIO, IMG SUPER/TERP, W  [facility] CATH; SEVICE: CARDIOLOGY  . LEFT HEART CATH AND CORS/GRAFTS ANGIOGRAPHY N/A 12/26/2016   Procedure: Left Heart Cath and Cors/Grafts Angiography;  Surgeon: Jonathan Sine, MD;  Location: [name];  Service: Cardiovascular;  Laterality: N/A;  . LEFT HEART VENTRICULOGRAPHY Left 05/14/2016   PR CATH PLACE/ CORON ANGIO, IMG SUPER/ INTERP, W   . TOTAL HIP ARTHROPLASTY Right 2013     Allergies  Allergies  Allergen Reactions  . Clopidogrel Other (See Comments)    Other reaction(s): GI Upset (intolerance) Other reaction(s): GI Upset (intolerance)  . Codeine Other (See Comments)    Other reaction(s): GI Upset (intolerance) Pt not sure  . Hydrocodone-Acetaminophen Other (See Comments)    Other reaction(s): GI Upset (intolerance)  . Simvastatin Other (See Comments)    Other reaction(s): GI Upset (intolerance)  . Influenza Vaccines Nausea And Vomiting    Inpatient Medications    . [START ON 02/13/2017] aspirin  81 mg Oral Daily  . enoxaparin (LOVENOX) injection  40 mg Subcutaneous Q24H  . [START ON 02/13/2017] ezetimibe  10 mg Oral Daily  . [START ON 02/13/2017] isosorbide mononitrate  30 mg Oral Daily  . metoprolol tartrate  25 mg Oral BID  . nicotine  14 mg Transdermal Daily  . [START ON 02/13/2017] pantoprazole  20 mg Oral Daily  . ticagrelor  90 mg Oral BID    Family History    Family History  Problem Relation Age of Onset  . Kidney failure Mother   . Hypertension Sister   . Hypertension Brother   .  Hypertension Brother     Social History    Social History   Social History  . Marital status: Married    Spouse name: N/A  . Number of children: N/A  . Years of education: N/A   Occupational History  . Not on file.   Social History Main Topics  . Smoking status: Current Some Day Smoker    Packs/day: 0.50    Years: 56.00    Last attempt to quit: 05/11/2016  . Smokeless tobacco: Never Used     Comment: Refused  . Alcohol use No  . Drug use: Yes  . Sexual activity: Yes    Birth control/ protection: Other-see comments   Other Topics Concern  . Not on file   Social History Narrative   Lives at home with family. Works as a Administrator. Independent otherwise     Review of Systems    General:  No chills, fever, night sweats or weight changes.  Cardiovascular:  No dyspnea on exertion, edema, orthopnea, palpitations, paroxysmal nocturnal dyspnea. Positive for chest pain.  Dermatological: No rash, lesions/masses Respiratory: No cough, dyspnea Urologic: No hematuria, dysuria Abdominal:   No nausea, vomiting, diarrhea, bright red blood per rectum, melena, or hematemesis Neurologic:  No visual changes, wkns, changes in mental status. Positive for syncope.   All other systems reviewed and are otherwise negative except as noted above.  Physical Exam    Blood pressure 137/73, pulse 63, temperature (!) 97.5 F (36.4 C), temperature source Oral, resp. rate 18, height 5\' 11"  (1.803 m), weight 210 lb (95.3 kg), SpO2 100 %.  General: Pleasant Caucasian male appearing in NAD Psych: Normal affect. Neuro: Alert and oriented X 3. Moves all extremities spontaneously. HEENT: Normal  Neck: Supple without bruits or JVD. Lungs:  Resp regular and unlabored, CTA without wheezing or rales Heart: RRR no s3, s4, or murmurs. Abdomen: Soft, non-tender, non-distended, BS + x 4.  Extremities: No clubbing, cyanosis or lower extremity edema. DP/PT/Radials 2+ and equal bilaterally.  Labs      Troponin (Point of Care Test) No results for input(s): TROPIPOC in the last 72 hours.  Recent Labs  02/12/17 0950  TROPONINI <0.03   [name] Results  Component Value Date   WBC 10.9 (H) 02/12/2017   HGB 13.5 02/12/2017   HCT 38.9 (L) 02/12/2017   MCV 92.7 02/12/2017   PLT 296 02/12/2017    Recent Labs [name] 02/12/17 0950  NA 137  K 4.2  CL 106  CO2 22  BUN 13  CREATININE 1.21  CALCIUM 9.0  GLUCOSE 121*   [name] Results  Component Value Date   CHOL 151 01/29/2017   HDL 37 (L) 01/29/2017   LDLCALC 97 01/29/2017   TRIG 83 01/29/2017   No results found for: [facility]   Radiology Studies    Dg Chest 1 View  Result Date: 02/12/2017 CLINICAL DATA:  Chest pressure, syncope EXAM: CHEST 1 VIEW COMPARISON:  01/25/2017 FINDINGS: Heart and mediastinal contours are within normal limits. No focal opacities or effusions. No acute bony abnormality. IMPRESSION: No active disease. Electronically Signed   By: Rolm Baptise M.D.   On: 02/12/2017 09:29   Ct Head Wo Contrast  Result Date: 02/12/2017 CLINICAL DATA:  Dizziness, frontal headache EXAM: CT HEAD WITHOUT CONTRAST TECHNIQUE: Contiguous axial images were obtained from the base of the skull through the vertex without intravenous contrast. COMPARISON:  10/27/2016 FINDINGS: Brain: No evidence of acute infarction, hemorrhage, hydrocephalus, extra-axial collection or mass lesion/mass effect. Vascular: No hyperdense vessel or unexpected calcification. Skull: Normal. Negative for fracture or focal lesion. Sinuses/Orbits: The visualized paranasal sinuses are essentially clear. The mastoid air cells are unopacified. Other: Three benign subcutaneous lesions, including a dominant 1.4 cm lesion in the left frontal lobe, likely reflecting dermal inclusion cysts, mildly increased. IMPRESSION: Normal head CT. Electronically Signed   By: Julian Hy M.D.   On: 02/12/2017 09:53    EKG & Cardiac Imaging    EKG:  NSR, HR 75, with no acute ST or T-wave  changes.  - Personally Reviewed  Cardiac Catheterization: 12/2016  Acute Mrg lesion, 30 %stenosed.  LIMA.  Ost 2nd Mrg lesion, 95 %stenosed.  2nd Diag lesion, 0 %stenosed.  Ost 2nd Diag to 2nd Diag lesion, 30 %stenosed.  Origin lesion, 100 %stenosed.  RIMA.  Mid Graft lesion, 85 %stenosed.  Ost LAD to Prox LAD lesion, 0 %stenosed.  The left ventricular ejection fraction is 50-55% by visual estimate.  The left  ventricular systolic function is normal.  There is moderate (3+) mitral regurgitation.  LM lesion, 95 %stenosed.  Preserved global LV function with an ejection fraction of approximately 55% but with moderate focal mid inferior hypocontractility. There appears to be 3+ mitral regurgitation.  Significant native CAD with mild 10-20% distal left main tapering, 95% ostial LAD stenosis with a patent stent in the proximal LAD before the first diagonal vessel with 30% ostial narrowing of diagonal vessel followed by a patent stent. The diagonal beyond the stented segment bifurcated and was small caliber; 95% ostial stenosis in the OM vessel which is bypassed; diffuse luminal irregularity of the proximal mid RCA with 30% narrowing in the anterior RV marginal branch.  Old occlusion of the vein graft which had supplied the distal RCA.  Patent LIMA graft which supplies the circumflex marginal vessel. The marginal vessel, bifurcates beyond the anastomosis and is free of significant disease.  Patent RIMA graft which seems to supply the diagonal vessel beyond the stented segment and does not fill into the LAD. There is a "kink" in the midportion of the graft on a sharp bend with narrowing up to 90%. Selective injection of IC nitroglycerin resolves this 85-90% stenosis, suggesting a component of spasm induced by the kinked segment.  Distal aortography demonstrates 40-50% narrowing in the midportion of the right iliac artery.  RECOMMENDATION: Medical therapy. Smoking  cessation is imperative. Nitrates will be added to the patient's medical regimen, particularly since the kinked portion of the RIMA graft improved following IC nitroglycerin administration.   Coronary Stent Intervention: 12/28/2016 1. Severe ostial LAD stenosis, unprotected LAD 2. Successful atherectomy, PTCA/DES x 1 ostial LAD  Recommendations: Continue DAPT with ASA and Brilinta for at least one year. Discharge in am.   Assessment & Plan    1. Syncope - reported having a loss of consciousness around 0830 which occurred while standing outside of his truck. Experienced dizziness prior to the event, but denies any associated chest pain, dyspnea, palpitations, or post-ictal symptoms. Did not experience bowel or bladder incontinence.  - labs show Na+ 137, K+ 4.2, creatinine 1.21. Mg 2.2. Initial troponin negative. CXR with no active disease. CT Head shows no acute abnormalities.  - continue to monitor on telemetry while admitted. Check carotid dopplers and orthostatics along with cycling cardiac enzymes. If no abnormal events are noted on telemetry, recommend a 30-day outpatient event monitor at the time of [name] discharge.   2. Chest Pain - he denies any recurrent episodes of chest pain since [name] discharge on 6/26 until this AM. Experienced an episode of stinging pain along his sternum which awoke him from sleep and spontaneously resolved within 5 minutes. No recent exertional symptoms. - EKG is without acute changes and initial troponin is negative.  - continue to cycle cardiac enzymes. With his recent normal NST at [name] in 01/2017, would not repeat a nuclear study at this time. If cardiac enzymes become elevated, he would then need a cardiac cath for definitve evaluation, but would otherwise not pursue further ischemic evaluation.   3. CAD - s/p CABG in 2008, DES to D1 in 05/2016, cath in 12/2016 showing patent LIMA-2nd Mrg, RIMA-2nd Diag with "kink" in the bend of the  graft which resolved with IC NTG, requiring DES to ostial LAD two days later for recurrent anginal symptoms. - NST in 01/2017 without ischemia. - continue ASA, Brilinta, Lopressor, and Imdur. Statin intolerant (needs referral to [facility] for consideration of a PCSK9-inhibitor as LDL was at  97 when checked on 6/26). He does report missing 1-2 doses of his Brilinta due to his work schedule. The importance of 100% compliance was stressed to the patient.   4. HTN - BP at 100/69 - 147/89 since admission - continue Imdur 30mg  daily and Lopressor 25mg  BID.   5. HLD - Lipid Panel in 01/2017 showed total cholesterol of 151, HDL 37, and LDL 97. Not at goal of LDL < 70. - intolerant to multiple statins. Remains on Zetia. Would benefit from referral to the [facility] for consideration of PCSK9-inhibitor.   Signed, Erma Heritage, PA-C 02/12/2017, 3:37 PM Pager: [phone]

## 2017-02-12 NOTE — Progress Notes (Signed)
Pt complaining of chest pain, pt only has nitro and tylenol ordered PRN. Pt states tylenol doesn't work and that nitro just gives him a headache. MD paged. Dr. Estanislado Pandy gave orders for a one time dose of Toradol. Will give and continue to monitor. Conley Simmonds, RN, BSN

## 2017-02-12 NOTE — ED Notes (Signed)
Sandwich tray provided per pt request.  

## 2017-02-12 NOTE — Progress Notes (Signed)
No chest pressure,no syncope,medicated for nausea and headache,cardiology consultation done and no planned procedure,awaiting carotid ultrasound

## 2017-02-12 NOTE — H&P (Signed)
[facility] [place] NAME: [name]    MR#:  [id number]  DATE OF BIRTH:  [birth year]  DATE OF ADMISSION:  02/12/2017  PRIMARY CARE PHYSICIAN: Jonathan Hipps, MD   REQUESTING/REFERRING PHYSICIAN: Dr. Larae [name]  CHIEF COMPLAINT:   Chief Complaint  Patient presents with  . Loss of Consciousness  . Chest Pain    HISTORY OF PRESENT ILLNESS:  [name]  is a 66 y.o. male with a known history of CAD status post CABG, repeat admission 2 months ago for chest pain and had a stent, admission and again at [facility] 2 weeks ago for coronary vasospasm and chest pain presents to the hospital today secondary to a syncopal episode. Patient states that he has been doing well since his discharge 2 weeks ago. This morning he woke up around 4:30 AM with chest pain with radiation to his left arm. He laid in bed and took some deep breaths with relief and went back to sleep. When he woke up again in the morning he did not have anymore chest pain. He went to work and was sitting and suddenly felt swimmy headed and had passed out. People who witnessed the fall, denied any seizure-like activity. Patient did not have any chest pain prior to the fall or after he woke up. He might be out for less than a minute, was taken inside and then he started to feel nauseous and had vomiting episodes associated with some diaphoresis. His first troponin here is negative. He denies any dehydration, recent illnesses, change in his medications. He is being admitted under observation for recurrent chest pain and new syncopal episode.  PAST MEDICAL HISTORY:   Past Medical History:  Diagnosis Date  . Cancer [facility])    history of prostate cancer  . COPD (chronic obstructive pulmonary disease) ([place])   . Coronary artery disease    a. 2008 s/p CABG x 3 [facility]): LIMA->OM, RIMA->Diag, VG->RPDA;  b. 05/2016 NSTEMI/PCI: DES ->prox LAD and ost diag;  c. 12/2016 Cath/PCI: LM 95d, LAD 95ost  (2.75x8 Promus Premier DES), patent prox stent, D2 30, LCX nl, OM2 95, RCA min irregs, LIMA->OM2 ok, RIMA->D2 85 "kink"-improved w/ IC NTG, VG->RPDA 100.  Marland Kitchen GERD (gastroesophageal reflux disease)   . Hyperlipidemia   . Hypertension   . Mitral regurgitation    a. mod by cath 12/2016.  . Statin intolerance   . Tobacco abuse     PAST SURGICAL HISTORY:   Past Surgical History:  Procedure Laterality Date  . ABDOMINAL AORTOGRAM N/A 12/26/2016   Procedure: Abdominal Aortogram;  Surgeon: Troy Sine, MD;  Location: [place] CV LAB;  Service: Cardiovascular;  Laterality: N/A;  . BACK SURGERY  2009  . CORONARY ANGIOGRAPHY N/A 12/28/2016   Procedure: Coronary Angiography;  Surgeon: Burnell Blanks, MD;  Location: [place] CV LAB;  Service: Cardiovascular;  Laterality: N/A;  . CORONARY ARTERY BYPASS GRAFT  2008  . CORONARY ATHERECTOMY N/A 12/28/2016   Procedure: Coronary Atherectomy;  Surgeon: Burnell Blanks, MD;  Location: [place] CV LAB;  Service: Cardiovascular;  Laterality: N/A;  . CORONARY STENT INTERVENTION N/A 12/28/2016   Procedure: Coronary Stent Intervention;  Surgeon: Burnell Blanks, MD;  Location: [place] CV LAB;  Service: Cardiovascular;  Laterality: N/A;  . LEFT HEART CATH Left 05/07/2016   PR CATH PLACE/CORON ANGIO, IMG SUPER/TERP, W  [facility] CATH; SEVICE: CARDIOLOGY  . LEFT HEART CATH AND CORS/GRAFTS ANGIOGRAPHY N/A 12/26/2016  Procedure: Left Heart Cath and Cors/Grafts Angiography;  Surgeon: Troy Sine, MD;  Location: [place] CV LAB;  Service: Cardiovascular;  Laterality: N/A;  . LEFT HEART VENTRICULOGRAPHY Left 05/14/2016   PR CATH PLACE/ CORON ANGIO, IMG SUPER/ INTERP, W   . TOTAL HIP ARTHROPLASTY Right 2013    SOCIAL HISTORY:   Social History  Substance Use Topics  . Smoking status: Current Some Day Smoker    Packs/day: 0.50    Years: 56.00    Last attempt to quit: 05/11/2016  . Smokeless tobacco: Never Used     Comment:  Refused  . Alcohol use No    FAMILY HISTORY:   Family History  Problem Relation Age of Onset  . Kidney failure Mother   . Hypertension Sister   . Hypertension Brother   . Hypertension Brother     DRUG ALLERGIES:   Allergies  Allergen Reactions  . Clopidogrel Other (See Comments)    Other reaction(s): GI Upset (intolerance) Other reaction(s): GI Upset (intolerance)  . Codeine Other (See Comments)    Other reaction(s): GI Upset (intolerance) Pt not sure  . Hydrocodone-Acetaminophen Other (See Comments)    Other reaction(s): GI Upset (intolerance)  . Simvastatin Other (See Comments)    Other reaction(s): GI Upset (intolerance)  . Influenza Vaccines Nausea And Vomiting    REVIEW OF SYSTEMS:   Review of Systems  Constitutional: Negative for chills, fever, malaise/fatigue and weight loss.  HENT: Negative for ear discharge, ear pain, hearing loss, nosebleeds and tinnitus.   Eyes: Negative for blurred vision, double vision and photophobia.  Respiratory: Negative for cough, hemoptysis, shortness of breath and wheezing.   Cardiovascular: Positive for chest pain. Negative for palpitations, orthopnea and leg swelling.  Gastrointestinal: Negative for abdominal pain, constipation, diarrhea, heartburn, melena, nausea and vomiting.  Genitourinary: Negative for dysuria, frequency, hematuria and urgency.  Musculoskeletal: Negative for back pain, myalgias and neck pain.  Skin: Negative for rash.  Neurological: Positive for dizziness. Negative for tingling, tremors, sensory change, speech change, focal weakness and headaches.  Endo/Heme/Allergies: Does not bruise/bleed easily.  Psychiatric/Behavioral: Negative for depression.    MEDICATIONS AT HOME:   Prior to Admission medications   Medication Sig Start Date End Date Taking? Authorizing Provider  aspirin 81 MG tablet Take 1 tablet (81 mg total) by mouth daily. 12/29/16  Yes Rogelia Mire, NP  ezetimibe (ZETIA) 10 MG tablet  Take 1 tablet (10 mg total) by mouth daily. 01/15/17 01/15/18 Yes Simmons, Brittainy M, PA-C  isosorbide mononitrate (IMDUR) 30 MG 24 hr tablet Take 1 tablet (30 mg total) by mouth daily. 01/15/17 04/15/17 Yes Simmons, Brittainy M, PA-C  metoprolol tartrate (LOPRESSOR) 25 MG tablet Take 1 tablet (25 mg total) by mouth 2 (two) times daily. 01/15/17  Yes Rosita Fire, Brittainy M, PA-C  pantoprazole (PROTONIX) 20 MG tablet Take 1 tablet (20 mg total) by mouth daily. 01/30/17  Yes Cheryln Manly, NP  ticagrelor (BRILINTA) 90 MG TABS tablet Take 1 tablet (90 mg total) by mouth 2 (two) times daily. 01/15/17  Yes Simmons, Brittainy M, PA-C  nicotine (NICODERM CQ - DOSED IN MG/24 HOURS) 14 mg/24hr patch Place 1 patch (14 mg total) onto the skin daily. Patient not taking: Reported on 01/28/2017 12/29/16   Rogelia Mire, NP  nitroGLYCERIN (NITROSTAT) 0.4 MG SL tablet Place 1 tablet (0.4 mg total) under the tongue as needed for chest pain. X 3 doses 12/29/16   Rogelia Mire, NP  predniSONE (DELTASONE) 20 MG tablet Take  1 tablet (20 mg total) by mouth daily with breakfast. Take 40mg  for 1 day, then 20mg  for 2 days, then 10mg  for 2 days. Patient not taking: Reported on 02/12/2017 01/29/17   Reino Bellis B, NP      VITAL SIGNS:  Blood pressure 140/76, pulse 61, temperature (!) 97.5 F (36.4 C), temperature source Oral, resp. rate 16, height 5\' 11"  (1.803 m), weight 95.3 kg (210 lb), SpO2 99 %.  PHYSICAL EXAMINATION:   Physical Exam  GENERAL:  66 y.o.-year-old patient lying in the bed with no acute distress.  EYES: Pupils equal, round, reactive to light and accommodation. No scleral icterus. Extraocular muscles intact.  HEENT: Head atraumatic, normocephalic. Oropharynx and nasopharynx clear.  NECK:  Supple, no jugular venous distention. No thyroid enlargement, no tenderness.  LUNGS: Normal breath sounds bilaterally, no wheezing, rales,rhonchi or crepitation. No use of accessory muscles of  respiration.  CARDIOVASCULAR: S1, S2 normal. No murmurs, rubs, or gallops.  ABDOMEN: Soft, nontender, nondistended. Bowel sounds present. No organomegaly or mass.  EXTREMITIES: No pedal edema, cyanosis, or clubbing. Abrasions on right knee from the fall. NEUROLOGIC: Cranial nerves II through XII are intact. Muscle strength 5/5 in all extremities. Sensation intact. Gait not checked.  PSYCHIATRIC: The patient is alert and oriented x 3.  SKIN: No obvious rash, lesion, or ulcer.   LABORATORY PANEL:   CBC  Recent Labs Lab 02/12/17 0911  WBC 10.9*  HGB 13.5  HCT 38.9*  PLT 296   ------------------------------------------------------------------------------------------------------------------  Chemistries   Recent Labs Lab 02/12/17 0950  NA 137  K 4.2  CL 106  CO2 22  GLUCOSE 121*  BUN 13  CREATININE 1.21  CALCIUM 9.0  MG 2.2   ------------------------------------------------------------------------------------------------------------------  Cardiac Enzymes  Recent Labs Lab 02/12/17 0950  TROPONINI <0.03   ------------------------------------------------------------------------------------------------------------------  RADIOLOGY:  Dg Chest 1 View  Result Date: 02/12/2017 CLINICAL DATA:  Chest pressure, syncope EXAM: CHEST 1 VIEW COMPARISON:  01/25/2017 FINDINGS: Heart and mediastinal contours are within normal limits. No focal opacities or effusions. No acute bony abnormality. IMPRESSION: No active disease. Electronically Signed   By: Rolm Baptise M.D.   On: 02/12/2017 09:29   Ct Head Wo Contrast  Result Date: 02/12/2017 CLINICAL DATA:  Dizziness, frontal headache EXAM: CT HEAD WITHOUT CONTRAST TECHNIQUE: Contiguous axial images were obtained from the base of the skull through the vertex without intravenous contrast. COMPARISON:  10/27/2016 FINDINGS: Brain: No evidence of acute infarction, hemorrhage, hydrocephalus, extra-axial collection or mass lesion/mass effect.  Vascular: No hyperdense vessel or unexpected calcification. Skull: Normal. Negative for fracture or focal lesion. Sinuses/Orbits: The visualized paranasal sinuses are essentially clear. The mastoid air cells are unopacified. Other: Three benign subcutaneous lesions, including a dominant 1.4 cm lesion in the left frontal lobe, likely reflecting dermal inclusion cysts, mildly increased. IMPRESSION: Normal head CT. Electronically Signed   By: Julian Hy M.D.   On: 02/12/2017 09:53    EKG:   Orders placed or performed during the hospital encounter of 02/12/17  . EKG 12-Lead  . EKG 12-Lead    IMPRESSION AND PLAN:   Eman Morimoto  is a 66 y.o. male with a known history of CAD status post CABG, repeat admission 2 months ago for chest pain and had a stent, admission and again at [facility] 2 weeks ago for coronary vasospasm and chest pain presents to the hospital today secondary to a syncopal episode.  #1 syncope-vasovagal syncope versus cardiogenic. -Admit to telemetry, resected troponins. -Carotid Dopplers advised. Cardiology consulted. Check  orthostatics.  #2 stable angina with history of CAD-admitted 2 weeks ago for coronary vasospasm, now again with chest pain and an episode of syncope today. -Monitor on telemetry, recycle troponins. Cardiology consult. Stress test ordered for a.m. -Continue cardiac medications including aspirin, brillinta, metoprolol and Imdur.  #3 hyperlipidemia-continue Zetia due to statin allergy  #4 tobacco use disorder-counseled. Has cut down. Continue nicotine patch  #5 DVT prophylaxis-on Lovenox    All the records are reviewed and case discussed with ED provider. Management plans discussed with the patient, family and they are in agreement.  CODE STATUS: Full Code  TOTAL TIME TAKING CARE OF THIS PATIENT: 50 minutes.    Alfreda Hammad M.D on 02/12/2017 at 1:15 PM  Between 7am to 6pm - Pager - [phone]  After 6pm go to [email or web address] - password [email or web address]  [facility]  Office  [phone]  CC: Primary care physician; Jonathan Hipps, MD

## 2017-02-12 NOTE — ED Provider Notes (Signed)
[facility] Emergency Department Provider Note  ____________________________________________   First MD Initiated Contact with Patient 02/12/17 [phone]     (approximate)  I have reviewed the triage vital signs and the nursing notes.   HISTORY  Chief Complaint Loss of Consciousness and Chest Pain   HPI [name] is a 66 y.o. male with a history of coronary artery disease status post CABG in 2008 9 further stenting in 2017. He also had a recent admission several weeks ago for chest pain. A normal stress test as reported on the documentation he was discharged home. However, he says that he became lightheaded with standing outside of his truck today and then proceeded syncopized. He said that he was having chest pressure prior to this which started about 4:30 AM when he woke up. He says the chest pain is central and was an 8 out of 10. He denies any radiation of the pain but says that this is similar to when he has needed stenting in the past. He has had a full dose of aspirin today as well as one nitroglycerin tablet which brought his pain down from 8 out of 10 to a 5 out of 10. He says that after syncopized in at EMS arrived to administer the medication. Unclear how long he was unconscious. No reports of any seizure activity. However, the patient is complaining of a mild-to-moderate frontal headache to his forehead that started after the fall. He does not want any further doses of nitroglycerin but is willing to accept morphine. Has had a cough but without any otherwise shortness of breath. Denies any nausea vomiting or diaphoresis. Says that prior to the episode that he did feel palpitations.   Past Medical History:  Diagnosis Date  . Cancer [facility])    history of prostate cancer  . COPD (chronic obstructive pulmonary disease) ([place])   . Coronary artery disease    a. 2008 s/p CABG x 3 [facility]): LIMA->OM, RIMA->Diag, VG->RPDA;  b. 05/2016 NSTEMI/PCI: DES ->prox LAD  and ost diag;  c. 12/2016 Cath/PCI: LM 95d, LAD 95ost (2.75x8 Promus Premier DES), patent prox stent, D2 30, LCX nl, OM2 95, RCA min irregs, LIMA->OM2 ok, RIMA->D2 85 "kink"-improved w/ IC NTG, VG->RPDA 100.  Marland Kitchen GERD (gastroesophageal reflux disease)   . Hyperlipidemia   . Hypertension   . Mitral regurgitation    a. mod by cath 12/2016.  . Statin intolerance   . Tobacco abuse     Patient Active Problem List   Diagnosis Date Noted  . Unstable angina ([place]) 12/29/2016  . Tobacco abuse 12/29/2016  . Chest pain 12/26/2016  . COPD (chronic obstructive pulmonary disease) ([place]) 12/25/2016  . Coronary artery disease 12/25/2016  . GERD (gastroesophageal reflux disease) 12/25/2016  . Hyperlipidemia 12/25/2016  . Hypertension 12/25/2016  . Coronary artery disease of bypass graft of native heart with stable angina pectoris ([place]) 06/08/2016  . Essential hypertension 06/08/2016  . History of smoking 05/29/2016  . Mixed dyslipidemia 05/29/2016  . Hx of CABG 05/06/2016  . Atypical migraine 09/29/2014    Past Surgical History:  Procedure Laterality Date  . ABDOMINAL AORTOGRAM N/A 12/26/2016   Procedure: Abdominal Aortogram;  Surgeon: Troy Sine, MD;  Location: [place] CV LAB;  Service: Cardiovascular;  Laterality: N/A;  . BACK SURGERY  2009  . CORONARY ANGIOGRAPHY N/A 12/28/2016   Procedure: Coronary Angiography;  Surgeon: Burnell Blanks, MD;  Location: [place] CV LAB;  Service: Cardiovascular;  Laterality: N/A;  .  CORONARY ARTERY BYPASS GRAFT  2008  . CORONARY ATHERECTOMY N/A 12/28/2016   Procedure: Coronary Atherectomy;  Surgeon: Burnell Blanks, MD;  Location: [place] CV LAB;  Service: Cardiovascular;  Laterality: N/A;  . CORONARY STENT INTERVENTION N/A 12/28/2016   Procedure: Coronary Stent Intervention;  Surgeon: Burnell Blanks, MD;  Location: [place] CV LAB;  Service: Cardiovascular;  Laterality: N/A;  . LEFT HEART CATH Left 05/07/2016   PR CATH  PLACE/CORON ANGIO, IMG SUPER/TERP, W  [facility] CATH; SEVICE: CARDIOLOGY  . LEFT HEART CATH AND CORS/GRAFTS ANGIOGRAPHY N/A 12/26/2016   Procedure: Left Heart Cath and Cors/Grafts Angiography;  Surgeon: Troy Sine, MD;  Location: [place] CV LAB;  Service: Cardiovascular;  Laterality: N/A;  . LEFT HEART VENTRICULOGRAPHY Left 05/14/2016   PR CATH PLACE/ CORON ANGIO, IMG SUPER/ INTERP, W   . TOTAL HIP ARTHROPLASTY Right 2013    Prior to Admission medications   Medication Sig Start Date End Date Taking? Authorizing Provider  aspirin 81 MG tablet Take 1 tablet (81 mg total) by mouth daily. 12/29/16  Yes Rogelia Mire, NP  ezetimibe (ZETIA) 10 MG tablet Take 1 tablet (10 mg total) by mouth daily. 01/15/17 01/15/18 Yes Simmons, Brittainy M, PA-C  isosorbide mononitrate (IMDUR) 30 MG 24 hr tablet Take 1 tablet (30 mg total) by mouth daily. 01/15/17 04/15/17 Yes Simmons, Brittainy M, PA-C  metoprolol tartrate (LOPRESSOR) 25 MG tablet Take 1 tablet (25 mg total) by mouth 2 (two) times daily. 01/15/17  Yes Rosita Fire, Brittainy M, PA-C  pantoprazole (PROTONIX) 20 MG tablet Take 1 tablet (20 mg total) by mouth daily. 01/30/17  Yes Cheryln Manly, NP  ticagrelor (BRILINTA) 90 MG TABS tablet Take 1 tablet (90 mg total) by mouth 2 (two) times daily. 01/15/17  Yes Simmons, Brittainy M, PA-C  nicotine (NICODERM CQ - DOSED IN MG/24 HOURS) 14 mg/24hr patch Place 1 patch (14 mg total) onto the skin daily. Patient not taking: Reported on 01/28/2017 12/29/16   Rogelia Mire, NP  nitroGLYCERIN (NITROSTAT) 0.4 MG SL tablet Place 1 tablet (0.4 mg total) under the tongue as needed for chest pain. X 3 doses 12/29/16   Rogelia Mire, NP  predniSONE (DELTASONE) 20 MG tablet Take 1 tablet (20 mg total) by mouth daily with breakfast. Take 40mg  for 1 day, then 20mg  for 2 days, then 10mg  for 2 days. Patient not taking: Reported on 02/12/2017 01/29/17   Reino Bellis B, NP    Allergies Clopidogrel; Codeine;  Hydrocodone-acetaminophen; Simvastatin; and Influenza vaccines  Family History  Problem Relation Age of Onset  . Kidney failure Mother   . Hypertension Sister   . Hypertension Brother   . Hypertension Brother     Social History Social History  Substance Use Topics  . Smoking status: Current Some Day Smoker    Packs/day: 0.50    Years: 56.00    Last attempt to quit: 05/11/2016  . Smokeless tobacco: Never Used     Comment: Refused  . Alcohol use No    Review of Systems  Constitutional: No fever/chills Eyes: No visual changes. ENT: No sore throat. Cardiovascular: as above Respiratory: Denies shortness of breath. Gastrointestinal: No abdominal pain.  No nausea, no vomiting.  No diarrhea.  No constipation. Genitourinary: Negative for dysuria. Musculoskeletal: Negative for back pain. Skin: Negative for rash. Neurological: Negative for headaches, focal weakness or numbness.   ____________________________________________   PHYSICAL EXAM:  VITAL SIGNS: ED Triage Vitals  Enc Vitals Group  BP 02/12/17 0911 (!) 147/84     Pulse Rate 02/12/17 0911 72     Resp 02/12/17 0911 17     Temp 02/12/17 0911 (!) 97.5 F (36.4 C)     Temp Source 02/12/17 0911 Oral     SpO2 02/12/17 0911 100 %     Weight 02/12/17 0912 210 lb (95.3 kg)     Height 02/12/17 0912 5\' 11"  (1.803 m)     Head Circumference --      Peak Flow --      Pain Score 02/12/17 0909 5     Pain Loc --      Pain Edu? --      Excl. in [place]? --     Constitutional: Alert and oriented. Well appearing and in no acute distress. Eyes: Conjunctivae are normal.  Head: Atraumatic. Nose: No congestion/rhinnorhea. Mouth/Throat: Mucous membranes are moist.  Neck: No stridor.   Cardiovascular: Normal rate, regular rhythm. Grossly normal heart sounds.  Good peripheral circulation with equal and bilateral radial pulses Respiratory: Normal respiratory effort.  No retractions. Lungs CTAB. Gastrointestinal: Soft and nontender.  No distention.  Musculoskeletal: No lower extremity tenderness nor edema.  No joint effusions. Neurologic:  Normal speech and language. No gross focal neurologic deficits are appreciated. Skin:  Skin is warm, dry and intact. No rash noted. Psychiatric: Mood and affect are normal. Speech and behavior are normal.  ____________________________________________   LABS (all labs ordered are listed, but only abnormal results are displayed)  Labs Reviewed  CBC WITH DIFFERENTIAL/PLATELET - Abnormal; Notable for the following:       Result Value   WBC 10.9 (*)    RBC 4.20 (*)    HCT 38.9 (*)    RDW 14.6 (*)    Neutro Abs 8.1 (*)    All other components within normal limits  BASIC METABOLIC PANEL - Abnormal; Notable for the following:    Glucose, Bld 121 (*)    All other components within normal limits  MAGNESIUM  TROPONIN I   ____________________________________________  EKG  ED ECG REPORT I, Doran Stabler, the attending physician, personally viewed and interpreted this ECG.   Date: 02/12/2017  EKG Time: 0907  Rate: 75  Rhythm: normal sinus rhythm  Axis: Normal  Intervals:none  ST&T Change: No ST segment elevation or depressions. No abnormal T-wave inversion.  ____________________________________________  RADIOLOGY  No acute finding on the chest x-[place] or head CT.   ____________________________________________   PROCEDURES  Procedure(s) performed:   Procedures  Critical Care performed:   ____________________________________________   INITIAL IMPRESSION / ASSESSMENT AND PLAN / ED COURSE  Pertinent labs & imaging results that were available during my care of the patient were reviewed by me and considered in my medical decision making (see chart for details).  ----------------------------------------- 12:20 PM on 02/12/2017 -----------------------------------------  Patient with persistent pain after morphine. Even another Nitro-Tab and pain is still 5  out of 10. However, the patient is resting comfortably without any distress this time. Very reassuring lab work. Discussed the case with cardiology, Dr.  Rockey Situ, who recommends admission to the hospital. This patient as well as his wife and they're understanding like to comply.      ____________________________________________   FINAL CLINICAL IMPRESSION(S) / ED DIAGNOSES  Chest pain. Syncope.      NEW MEDICATIONS STARTED DURING THIS VISIT:  New Prescriptions   No medications on file     Note:  This document was prepared using Dragon voice recognition  software and may include unintentional dictation errors.     Orbie Pyo, MD 02/12/17 [phone]

## 2017-02-12 NOTE — ED Notes (Signed)
Patient transported to CT 

## 2017-02-13 ENCOUNTER — Observation Stay: Payer: Medicare Other

## 2017-02-13 DIAGNOSIS — I6509 Occlusion and stenosis of unspecified vertebral artery: Secondary | ICD-10-CM | POA: Diagnosis not present

## 2017-02-13 DIAGNOSIS — R079 Chest pain, unspecified: Secondary | ICD-10-CM | POA: Diagnosis not present

## 2017-02-13 DIAGNOSIS — I6523 Occlusion and stenosis of bilateral carotid arteries: Secondary | ICD-10-CM | POA: Diagnosis not present

## 2017-02-13 DIAGNOSIS — R55 Syncope and collapse: Secondary | ICD-10-CM | POA: Diagnosis not present

## 2017-02-13 DIAGNOSIS — I2510 Atherosclerotic heart disease of native coronary artery without angina pectoris: Secondary | ICD-10-CM | POA: Diagnosis not present

## 2017-02-13 DIAGNOSIS — I208 Other forms of angina pectoris: Secondary | ICD-10-CM | POA: Diagnosis not present

## 2017-02-13 DIAGNOSIS — R2681 Unsteadiness on feet: Secondary | ICD-10-CM | POA: Diagnosis not present

## 2017-02-13 DIAGNOSIS — I25118 Atherosclerotic heart disease of native coronary artery with other forms of angina pectoris: Secondary | ICD-10-CM | POA: Diagnosis not present

## 2017-02-13 DIAGNOSIS — I10 Essential (primary) hypertension: Secondary | ICD-10-CM | POA: Diagnosis not present

## 2017-02-13 LAB — CBC
HEMATOCRIT: 36 % — AB (ref 40.0–52.0)
Hemoglobin: 12.8 g/dL — ABNORMAL LOW (ref 13.0–18.0)
MCH: 32.3 pg (ref 26.0–34.0)
MCHC: 35.6 g/dL (ref 32.0–36.0)
MCV: 90.6 fL (ref 80.0–100.0)
Platelets: 244 10*3/uL (ref 150–440)
RBC: 3.98 MIL/uL — ABNORMAL LOW (ref 4.40–5.90)
RDW: 14.2 % (ref 11.5–14.5)
WBC: 7.3 10*3/uL (ref 3.8–10.6)

## 2017-02-13 LAB — BASIC METABOLIC PANEL
Anion gap: 8 (ref 5–15)
BUN: 19 mg/dL (ref 6–20)
CO2: 24 mmol/L (ref 22–32)
Calcium: 8.6 mg/dL — ABNORMAL LOW (ref 8.9–10.3)
Chloride: 106 mmol/L (ref 101–111)
Creatinine, Ser: 1.13 mg/dL (ref 0.61–1.24)
GFR calc Af Amer: >60 mL/min (ref >60)
GLUCOSE: 95 mg/dL (ref 65–99)
POTASSIUM: 4.1 mmol/L (ref 3.5–5.1)
Sodium: 138 mmol/L (ref 135–145)

## 2017-02-13 LAB — TROPONIN I: Troponin I: <0.03 ng/mL (ref <0.03)

## 2017-02-13 MED ORDER — PANTOPRAZOLE SODIUM 40 MG PO TBEC
40.0000 mg | DELAYED_RELEASE_TABLET | Freq: Every day | ORAL | Status: DC
  Administered 2017-02-13 – 2017-02-14 (×2): 40 mg via ORAL
  Filled 2017-02-13 (×2): qty 1

## 2017-02-13 MED ORDER — TRAMADOL HCL 50 MG PO TABS
100.0000 mg | ORAL_TABLET | Freq: Four times a day (QID) | ORAL | Status: DC
  Administered 2017-02-13 – 2017-02-14 (×5): 100 mg via ORAL
  Filled 2017-02-13 (×5): qty 2

## 2017-02-13 MED ORDER — TRAMADOL HCL 50 MG PO TABS
50.0000 mg | ORAL_TABLET | Freq: Four times a day (QID) | ORAL | Status: DC
  Administered 2017-02-13: 50 mg via ORAL
  Filled 2017-02-13: qty 1

## 2017-02-13 MED ORDER — HYDROMORPHONE HCL 1 MG/ML IJ SOLN
1.0000 mg | INTRAMUSCULAR | Status: DC | PRN
  Administered 2017-02-13 (×2): 1 mg via INTRAVENOUS
  Filled 2017-02-13 (×2): qty 1

## 2017-02-13 MED ORDER — MECLIZINE HCL 25 MG PO TABS
25.0000 mg | ORAL_TABLET | Freq: Two times a day (BID) | ORAL | Status: DC
  Administered 2017-02-13: 25 mg via ORAL
  Filled 2017-02-13 (×2): qty 1

## 2017-02-13 MED ORDER — MECLIZINE HCL 25 MG PO TABS
25.0000 mg | ORAL_TABLET | Freq: Three times a day (TID) | ORAL | Status: DC
  Administered 2017-02-13 – 2017-02-14 (×3): 25 mg via ORAL
  Filled 2017-02-13 (×5): qty 1

## 2017-02-13 NOTE — Plan of Care (Signed)
Problem: Pain Managment: Goal: General experience of comfort will improve Outcome: Not Progressing Pt with chest pain x 2 this shift. Treated with toradol, with no relief. Then gave dilaudid which gave relief. Will continue to monitor.  Problem: Tissue Perfusion: Goal: Risk factors for ineffective tissue perfusion will decrease Outcome: Progressing lovenox for VTE.

## 2017-02-13 NOTE — Progress Notes (Signed)
Pt still complaining of 10-10 chest pain after the toradol and zofran. EKG done, MD paged. Dr. Estanislado Pandy came and saw pt. Said EKG looked fine, ordered some diluadid for pain and also to cycle troponins x2. Will continue to monitor. Conley Simmonds, RN, BSN

## 2017-02-13 NOTE — Care Management Obs Status (Signed)
[place] NOTIFICATION   Patient Details  Name: [name] MRN: [id number] Date of Birth: [birth year]   Medicare Observation Status Notification Given:  Yes    Katrina Stack, RN 02/13/2017, 8:13 AM

## 2017-02-13 NOTE — Evaluation (Signed)
Physical Therapy Evaluation Patient Details Name: [name] MRN: [id number] DOB: [birth year] Today's Date: 02/13/2017   History of Present Illness  [name]  is a 66 y.o. male with a known history of CAD status post CABG, repeat admission 2 months ago for chest pain and had a stent, admission and again at [facility] 2 weeks ago for coronary vasospasm and chest pain presents to the hospital today secondary to a syncopal episode. Patient states that he has been doing well since his discharge 2 weeks ago. This morning he woke up around 4:30 AM with chest pain with radiation to his left arm. He laid in bed and took some deep breaths with relief and went back to sleep. When he woke up again in the morning he did not have any more chest pain. He went to work and was sitting and suddenly felt swimmy headed and had passed out. People who witnessed the fall, denied any seizure-like activity. Patient did not have any chest pain prior to the fall or after he woke up. He was likely out for less than a minute, was taken inside and then he started to feel nauseous and had vomiting episodes associated with some diaphoresis. His first troponin here is negative. He denies any dehydration, recent illnesses, change in his medications. He is being admitted under observation for recurrent chest pain and new syncopal episode. Pt is now having dizziness with attempts to sit and stand. PT consultation requested for gait instability.   Clinical Impression  Pt admitted with above diagnosis. Pt currently with functional limitations due to the deficits listed below (see PT Problem List).  Pt is lethargic and mildly obtunded during physical therapy evaluation. He endorses constant dizziness with both position changes but also when sitting statically at EOB. He struggles to perform finger follow with very saccadic eye tracking which increase his dizziness. No spontaneous or gaze-evoked nystagmus. Vestibular testing deferred due to possibility  of concussion however history points to more central causes of his dizziness. He is extremely unsteady with transfers and hallway ambulation. Therapist has to support patient and prevent him from falling continually throughout ambulation with rolling walker. MD notified of mobility concerns. Per MD concerns are for possible concussion. Will continue to follow patient to ensure safe discharge disposition. At this time he is unsafe to return home due to his instability. If his dizziness/instability persists he will need 24/7 assistance with all mobility. If he is able to discharge but symptoms persists he may need outpatient vestibular therapy for dizziness/instability. Pt will benefit from PT services to address deficits in strength, balance, and mobility in order to return to full function at home.     Follow Up Recommendations Outpatient PT;Other (comment) (Vestibular therapy if dizziness/instability persists)    Equipment Recommendations  None recommended by PT;Other (comment) (Pt should use his rolling walker at discharge)    Recommendations for Other Services       Precautions / Restrictions Precautions Precautions: Fall Restrictions Weight Bearing Restrictions: No      Mobility  Bed Mobility Overal bed mobility: Modified Independent             General bed mobility comments: HOB elevated and use of bed rails. Increased time required  Transfers Overall transfer level: Needs assistance Equipment used: Rolling walker (2 wheeled) Transfers: Sit to/from Stand Sit to Stand: Min assist         General transfer comment: Pt is very unsteady with transfers falling back onto bed multiple times before  coming to standing. Once upright he is steady and stable after increased time with UE support on walker  Ambulation/Gait Ambulation/Gait assistance: Mod assist;+2 safety/equipment Ambulation Distance (Feet): 80 Feet Assistive device: Rolling walker (2 wheeled) Gait  Pattern/deviations: Decreased step length - right;Decreased step length - left;Staggering left;Staggering right Gait velocity: Decreased Gait velocity interpretation: <1.8 ft/sec, indicative of risk for recurrent falls General Gait Details: Pt staggers constantly during ambulation requiring modA+1 to stabilize. He mostly falls to the right. Struggles to advance walker and struggles with proper foot placement. Pt very unsafe with ambulation and has multiple episodes where therapist has to prevent him from falling with ambulation.   Stairs            Wheelchair Mobility    Modified Rankin (Stroke Patients Only)       Balance Overall balance assessment: Needs assistance Sitting-balance support: No upper extremity supported Sitting balance-Leahy Scale: Good     Standing balance support: No upper extremity supported Standing balance-Leahy Scale: Poor Standing balance comment: Pt able to stabilize with UE support but without support he is very unsteady                             Pertinent Vitals/Pain Pain Assessment: No/denies pain    Home Living Family/patient expects to be discharged to:: Private residence Living Arrangements: Spouse/significant other Available Help at Discharge: Family Type of Home: House Home Access: Stairs to enter Entrance Stairs-Rails: None Entrance Stairs-Number of Steps: 3 Home Layout: One level Home Equipment: Environmental consultant - 2 wheels;Cane - single point;Crutches;Grab bars - tub/shower      Prior Function Level of Independence: Independent         Comments: Independent with ADLs/IADLs. Drives and works. Syncopal episode is the only fall he has experienced in the last 12 months. Independent ambulation without assistive device     Hand Dominance   Dominant Hand: Right    Extremity/Trunk Assessment   Upper Extremity Assessment Upper Extremity Assessment: RUE deficits/detail RUE Deficits / Details: Strength and sensation grossly  WFL. Finger to nose is slightly slowed but not dysmetric. Pt does have some L facial droop when he smiles.    Lower Extremity Assessment Lower Extremity Assessment: Overall WFL for tasks assessed (No clonus or spasticity. No foot drop)       Communication   Communication: No difficulties  Cognition Arousal/Alertness: Lethargic Behavior During Therapy: Flat affect Overall Cognitive Status: Within Functional Limits for tasks assessed                                 General Comments: Pt with occasional delayed response      General Comments      Exercises     Assessment/Plan    PT Assessment Patient needs continued PT services  PT Problem List Decreased activity tolerance;Decreased balance;Decreased mobility;Decreased safety awareness       PT Treatment Interventions DME instruction;Gait training;Stair training;Functional mobility training;Therapeutic activities;Therapeutic exercise;Balance training;Neuromuscular re-education;Patient/family education    PT Goals (Current goals can be found in the Care Plan section)  Acute Rehab PT Goals Patient Stated Goal: Return to prior level of function PT Goal Formulation: With patient/family Time For Goal Achievement: 02/27/17 Potential to Achieve Goals: Good    Frequency Min 2X/week   Barriers to discharge   Lives with wife however pt is too unsteady for her to support adequately  Co-evaluation               AM-PAC PT "6 Clicks" Daily Activity  Outcome Measure Difficulty turning over in bed (including adjusting bedclothes, sheets and blankets)?: A Little Difficulty moving from lying on back to sitting on the side of the bed? : A Little Difficulty sitting down on and standing up from a chair with arms (e.g., wheelchair, bedside commode, etc,.)?: Total Help needed moving to and from a bed to chair (including a wheelchair)?: A Lot Help needed walking in hospital room?: A Lot Help needed climbing 3-5 steps  with a railing? : A Lot 6 Click Score: 13    End of Session Equipment Utilized During Treatment: Gait belt Activity Tolerance: Patient tolerated treatment well Patient left: in bed;with call bell/phone within reach;with bed alarm set;with family/visitor present Nurse Communication: Mobility status;Other (comment) (MD paged and notified) PT Visit Diagnosis: Unsteadiness on feet (R26.81);Other abnormalities of gait and mobility (R26.89);Difficulty in walking, not elsewhere classified (R26.2)    Time: 2831-5176 PT Time Calculation (min) (ACUTE ONLY): 30 min   Charges:   PT Evaluation $PT Eval Moderate Complexity: 1 Procedure PT Treatments $Gait Training: 8-22 mins   PT G Codes:   PT G-Codes **NOT FOR INPATIENT CLASS** Functional Assessment Tool Used: AM-PAC 6 Clicks Basic Mobility Functional Limitation: Mobility: Walking and moving around Mobility: Walking and Moving Around Current Status (H6073): At least 40 percent but less than 60 percent impaired, limited or restricted Mobility: Walking and Moving Around Goal Status [phone]): At least 1 percent but less than 20 percent impaired, limited or restricted    Phillips Grout PT, DPT    Hari Casaus 02/13/2017, 5:25 PM

## 2017-02-13 NOTE — Care Management (Signed)
CM screen. Patient  under observation for syncope in patient with recent cardiac stent. Cariology has consulted.  Troponins are negative.   Patient presents from home and independent in all adls.  Denies issues accessing medical care, obtaining medications, maintaining housing, utilities and food.   No discharge needs identified at present time.

## 2017-02-13 NOTE — Progress Notes (Signed)
Progress Note  Patient Name: [name] Date of Encounter: 02/13/2017  Primary Cardiologist: [facility]  Subjective   Reports having chest pain overnight, nausea Received pain medication overnight for chest pain symptoms Reports pain medication helped Pain reproducible on palpation bilaterally midsternum Also reports having severe pain right side of his face Disoriented, poor balance when he stands up. Patient confirms this as does nursing  EKG from overnight reviewed, repeat cardiac enzymes unrevealing  Inpatient Medications    Scheduled Meds: . aspirin  81 mg Oral Daily  . enoxaparin (LOVENOX) injection  40 mg Subcutaneous Q24H  . ezetimibe  10 mg Oral Daily  . isosorbide mononitrate  30 mg Oral Daily  . meclizine  25 mg Oral TID  . metoprolol tartrate  25 mg Oral BID  . nicotine  14 mg Transdermal Daily  . pantoprazole  40 mg Oral Daily  . ticagrelor  90 mg Oral BID  . traMADol  100 mg Oral Q6H   Continuous Infusions:  PRN Meds: acetaminophen **OR** acetaminophen, HYDROmorphone (DILAUDID) injection, nitroGLYCERIN, ondansetron **OR** ondansetron (ZOFRAN) IV   Vital Signs    Vitals:   02/13/17 0726 02/13/17 1417 02/13/17 1511 02/13/17 1514  BP: (!) 151/65 138/62 (!) 146/66 128/71  Pulse: (!) 54 (!) 54 63 70  Resp: 16 18    Temp: 97.6 F (36.4 C) (!) 97.5 F (36.4 C)    TempSrc: Oral Oral    SpO2: 100% 99% 99% 98%  Weight:      Height:        Intake/Output Summary (Last 24 hours) at 02/13/17 1707 Last data filed at 02/13/17 1015  Gross per 24 hour  Intake              480 ml  Output                0 ml  Net              480 ml   Filed Weights   02/12/17 0912  Weight: 210 lb (95.3 kg)    Telemetry    Normal sinus rhythm with no significant arrhythmia - Personally Reviewed  ECG    Normal sinus rhythm, no significant ST or T-wave changes- Personally Reviewed  Physical Exam   GEN: No acute distress.   Neck: No JVD Cardiac: RRR, no murmurs, rubs, or  gallops.  Respiratory: Clear to auscultation bilaterally. GI: Soft, nontender, non-distended  MS: No edema; No deformity.Reproducible chest pain with palpation of his mediastinum  Neuro:  Nonfocal  Psych: Normal affect   Labs    Chemistry Recent Labs Lab 02/12/17 0950 02/13/17 0703  NA 137 138  K 4.2 4.1  CL 106 106  CO2 22 24  GLUCOSE 121* 95  BUN 13 19  CREATININE 1.21 1.13  CALCIUM 9.0 8.6*  GFRNONAA >60 >60  GFRAA >60 >60  ANIONGAP 9 8     Hematology Recent Labs Lab 02/12/17 0911 02/13/17 0703  WBC 10.9* 7.3  RBC 4.20* 3.98*  HGB 13.5 12.8*  HCT 38.9* 36.0*  MCV 92.7 90.6  MCH 32.0 32.3  MCHC 34.6 35.6  RDW 14.6* 14.2  PLT 296 244    Cardiac Enzymes Recent Labs Lab 02/12/17 2042 02/13/17 0112 02/13/17 0349 02/13/17 0703  TROPONINI <0.03 <0.03 <0.03 <0.03   No results for input(s): TROPIPOC in the last 168 hours.   BNPNo results for input(s): BNP, PROBNP in the last 168 hours.   DDimer No results for input(s): DDIMER  in the last 168 hours.   Radiology    Dg Chest 1 View  Result Date: 02/12/2017 CLINICAL DATA:  Chest pressure, syncope EXAM: CHEST 1 VIEW COMPARISON:  01/25/2017 FINDINGS: Heart and mediastinal contours are within normal limits. No focal opacities or effusions. No acute bony abnormality. IMPRESSION: No active disease. Electronically Signed   By: Rolm Baptise M.D.   On: 02/12/2017 09:29   Ct Head Wo Contrast  Result Date: 02/12/2017 CLINICAL DATA:  Dizziness, frontal headache EXAM: CT HEAD WITHOUT CONTRAST TECHNIQUE: Contiguous axial images were obtained from the base of the skull through the vertex without intravenous contrast. COMPARISON:  10/27/2016 FINDINGS: Brain: No evidence of acute infarction, hemorrhage, hydrocephalus, extra-axial collection or mass lesion/mass effect. Vascular: No hyperdense vessel or unexpected calcification. Skull: Normal. Negative for fracture or focal lesion. Sinuses/Orbits: The visualized paranasal sinuses  are essentially clear. The mastoid air cells are unopacified. Other: Three benign subcutaneous lesions, including a dominant 1.4 cm lesion in the left frontal lobe, likely reflecting dermal inclusion cysts, mildly increased. IMPRESSION: Normal head CT. Electronically Signed   By: Julian Hy M.D.   On: 02/12/2017 09:53   Mr Brain Wo Contrast  Result Date: 02/13/2017 CLINICAL DATA:  Syncopal episode. Swimmy headed. Vomiting with diaphoresis. EXAM: MRI HEAD WITHOUT CONTRAST TECHNIQUE: Multiplanar, multiecho pulse sequences of the brain and surrounding structures were obtained without intravenous contrast. COMPARISON:  CT head 02/12/2017.  MR head 04/26/2008. FINDINGS: Brain: No evidence for acute infarction, hemorrhage, mass lesion, hydrocephalus, or extra-axial fluid. Mild atrophy. Minor white matter signal abnormality, likely small vessel disease. Vascular: Flow voids are maintained throughout the carotid, basilar, and vertebral arteries. There are no areas of chronic hemorrhage. Skull and upper cervical spine: Unremarkable visualized calvarium, skullbase, and cervical vertebrae. Pituitary, pineal, cerebellar tonsils unremarkable. No upper cervical cord lesions. Sinuses/Orbits: No orbital masses or proptosis. Globes appear symmetric. Sinuses appear well aerated, without evidence for air-fluid level. Other: Compared with prior MR, slight progression of atrophy. IMPRESSION: Atrophy and small vessel disease.  No acute intracranial findings. Electronically Signed   By: Staci Righter M.D.   On: 02/13/2017 11:38   US Carotid Bilateral  Result Date: 02/13/2017 CLINICAL DATA:  Chest pain. Syncopal episode. His area, CAD (post myocardial infarction and coronary stent placement), visual disturbance, hyperlipidemia and smoking. EXAM: BILATERAL CAROTID DUPLEX ULTRASOUND TECHNIQUE: Celani scale imaging, color Doppler and duplex ultrasound were performed of bilateral carotid and vertebral arteries in the neck.  COMPARISON:  None. FINDINGS: Criteria: Quantification of carotid stenosis is based on velocity parameters that correlate the residual internal carotid diameter with NASCET-based stenosis levels, using the diameter of the distal internal carotid lumen as the denominator for stenosis measurement. The following velocity measurements were obtained: RIGHT ICA:  205/52 cm/sec CCA:  78/46 cm/sec SYSTOLIC ICA/CCA RATIO:  2.4 DIASTOLIC ICA/CCA RATIO:  2.5 ECA:  150 cm/sec LEFT ICA:  132/38 cm/sec CCA:  962/95 cm/sec SYSTOLIC ICA/CCA RATIO:  0.8 DIASTOLIC ICA/CCA RATIO:  1.1 ECA:  126 cm/sec RIGHT CAROTID ARTERY: There is a large amount of eccentric mixed echogenic plaque within the right carotid bulb (images 15 and 17), extending to involve the origin and proximal aspect the right internal carotid artery (is 25), which results in elevated peak systolic velocities within the proximal aspect the right internal carotid artery (greatest acquired peak systolic velocity with the proximal right ICA measures 205 cm/sec - image 27). RIGHT VERTEBRAL ARTERY:  Antegrade Flow LEFT CAROTID ARTERY: There is a moderate amount of eccentric hypoechoic plaque  within the mid and distal aspects of the left common carotid artery (images 44 and 48). There is a moderate to large amount of eccentric mixed echogenic plaque within the left carotid bulb (image 53), extending to involve the origin and proximal aspect the left internal carotid artery (image 61, resulting in elevated peak systolic velocities within the mid aspect the left internal carotid artery (greatest acquired peak systolic velocity within mid ICA measures 132 cm/sec -image 67). LEFT VERTEBRAL ARTERY:  Antegrade Flow IMPRESSION: Moderate to large amount of bilateral atherosclerotic plaque, right greater than left, results in elevated peak systolic velocities within the bilateral internal carotid arteries compatible with the 50-69% luminal narrowing range bilaterally. Further  evaluation with CTA could performed as clinically indicated. Electronically Signed   By: Sandi Mariscal M.D.   On: 02/13/2017 13:21    Cardiac Studies   Carotid ultrasound with moderate bilateral atherosclerosis estimated 40-59%   Patient Profile     66 y.o. male   Assessment & Plan    ----Syncope Etiology unclear Possible vasovagal etiology  In the setting of severe leg pain also with nausea Following the episode had vomiting with large emesis, hamburger from day before No arrhythmia on telemetry He has ruled out Orthostatics were negative Of note he has at least moderate bilateral carotid arterial disease though no evidence of stroke  ----Unsteady gait Possibly from syncope, hitting face on the ground, concussion Continued balance issue when standing last night and this evening MRI unrevealing  --- CAD/Chest pain Denies any significant symptoms concerning for angina EKG unchanged, cardiac enzymes negative No further ischemia workup recommended Chest pain today's atypical, reproducible on palpation and relieved with pain medication  ----Hyperlipidemia Continue his high-dose statin consideration of PCSK9-inhibitor.  We'll discuss his outpatient  -----Nausea vomiting Had nausea preceding syncope, followed by vomiting Emesis, including hamburger from day before Unable to exclude gastroenteritis  Case discussed with nursing and hospitalist service Imaging studies reviewed Long discussion with patient concerning above  Total encounter time more than 35 minutes  Greater than 50% was spent in counseling and coordination of care with the patient    Signed, Ida Rogue, MD  02/13/2017, 5:07 PM

## 2017-02-13 NOTE — Progress Notes (Signed)
[place] [facility] [place] NAME: [name]    MR#:  [id number]  DATE OF BIRTH:  [birth year]  SUBJECTIVE:  Patient came in after he had a syncopal fall and landed up on the gravel floor at work. He bruised his right knee and is complaining of her right head/facial pain along with right chest wall pain. He's been having some dizziness while sitting up appears postural No shortness of breath No head injury  REVIEW OF SYSTEMS:   Review of Systems  Constitutional: Negative for chills, fever and weight loss.  HENT: Negative for ear discharge, ear pain and nosebleeds.   Eyes: Negative for blurred vision, pain and discharge.  Respiratory: Negative for sputum production, shortness of breath, wheezing and stridor.   Cardiovascular: Positive for chest pain. Negative for palpitations, orthopnea and PND.  Gastrointestinal: Negative for abdominal pain, diarrhea, nausea and vomiting.  Genitourinary: Negative for frequency and urgency.  Musculoskeletal: Negative for back pain and joint pain.  Neurological: Positive for dizziness and weakness. Negative for sensory change, speech change and focal weakness.  Psychiatric/Behavioral: Negative for depression and hallucinations. The patient is not nervous/anxious.    Tolerating Diet: Yes Tolerating PT: Pending  DRUG ALLERGIES:   Allergies  Allergen Reactions  . Clopidogrel Other (See Comments)    Other reaction(s): GI Upset (intolerance) Other reaction(s): GI Upset (intolerance)  . Codeine Other (See Comments)    Other reaction(s): GI Upset (intolerance) Pt not sure  . Hydrocodone-Acetaminophen Other (See Comments)    Other reaction(s): GI Upset (intolerance)  . Simvastatin Other (See Comments)    Other reaction(s): GI Upset (intolerance)  . Influenza Vaccines Nausea And Vomiting    VITALS:  Blood pressure (!) 151/65, pulse (!) 54, temperature 97.6 F (36.4 C), temperature source Oral, resp. rate 16,  height 5\' 11"  (1.803 m), weight 95.3 kg (210 lb), SpO2 100 %.  PHYSICAL EXAMINATION:   Physical Exam  GENERAL:  66 y.o.-year-old patient lying in the bed with no acute distress.  EYES: Pupils equal, round, reactive to light and accommodation. No scleral icterus. Extraocular muscles intact.  HEENT: Head atraumatic, normocephalic. Oropharynx and nasopharynx clear.Tenderness over the right face and forehead NECK:  Supple, no jugular venous distention. No thyroid enlargement, no tenderness.  LUNGS: Normal breath sounds bilaterally, no wheezing, rales, rhonchi. No use of accessory muscles of respiration. Tenderness in the right chest wall  CARDIOVASCULAR: S1, S2 normal. No murmurs, rubs, or gallops.  ABDOMEN: Soft, nontender, nondistended. Bowel sounds present. No organomegaly or mass.  EXTREMITIES: No cyanosis, clubbing or edema b/l.   Right knee bruised NEUROLOGIC: Cranial nerves II through XII are intact. No focal Motor or sensory deficits b/l.   PSYCHIATRIC:  patient is alert and oriented x 3.  SKIN: No obvious rash, lesion, or ulcer.   LABORATORY PANEL:  CBC  Recent Labs Lab 02/13/17 0703  WBC 7.3  HGB 12.8*  HCT 36.0*  PLT 244    Chemistries   Recent Labs Lab 02/12/17 0950 02/13/17 0703  NA 137 138  K 4.2 4.1  CL 106 106  CO2 22 24  GLUCOSE 121* 95  BUN 13 19  CREATININE 1.21 1.13  CALCIUM 9.0 8.6*  MG 2.2  --    Cardiac Enzymes  Recent Labs Lab 02/13/17 0703  TROPONINI <0.03   RADIOLOGY:  Dg Chest 1 View  Result Date: 02/12/2017 CLINICAL DATA:  Chest pressure, syncope EXAM: CHEST 1 VIEW COMPARISON:  01/25/2017 FINDINGS: Heart and mediastinal  contours are within normal limits. No focal opacities or effusions. No acute bony abnormality. IMPRESSION: No active disease. Electronically Signed   By: Rolm Baptise M.D.   On: 02/12/2017 09:29   Ct Head Wo Contrast  Result Date: 02/12/2017 CLINICAL DATA:  Dizziness, frontal headache EXAM: CT HEAD WITHOUT CONTRAST  TECHNIQUE: Contiguous axial images were obtained from the base of the skull through the vertex without intravenous contrast. COMPARISON:  10/27/2016 FINDINGS: Brain: No evidence of acute infarction, hemorrhage, hydrocephalus, extra-axial collection or mass lesion/mass effect. Vascular: No hyperdense vessel or unexpected calcification. Skull: Normal. Negative for fracture or focal lesion. Sinuses/Orbits: The visualized paranasal sinuses are essentially clear. The mastoid air cells are unopacified. Other: Three benign subcutaneous lesions, including a dominant 1.4 cm lesion in the left frontal lobe, likely reflecting dermal inclusion cysts, mildly increased. IMPRESSION: Normal head CT. Electronically Signed   By: Julian Hy M.D.   On: 02/12/2017 09:53   Mr Brain Wo Contrast  Result Date: 02/13/2017 CLINICAL DATA:  Syncopal episode. Swimmy headed. Vomiting with diaphoresis. EXAM: MRI HEAD WITHOUT CONTRAST TECHNIQUE: Multiplanar, multiecho pulse sequences of the brain and surrounding structures were obtained without intravenous contrast. COMPARISON:  CT head 02/12/2017.  MR head 04/26/2008. FINDINGS: Brain: No evidence for acute infarction, hemorrhage, mass lesion, hydrocephalus, or extra-axial fluid. Mild atrophy. Minor white matter signal abnormality, likely small vessel disease. Vascular: Flow voids are maintained throughout the carotid, basilar, and vertebral arteries. There are no areas of chronic hemorrhage. Skull and upper cervical spine: Unremarkable visualized calvarium, skullbase, and cervical vertebrae. Pituitary, pineal, cerebellar tonsils unremarkable. No upper cervical cord lesions. Sinuses/Orbits: No orbital masses or proptosis. Globes appear symmetric. Sinuses appear well aerated, without evidence for air-fluid level. Other: Compared with prior MR, slight progression of atrophy. IMPRESSION: Atrophy and small vessel disease.  No acute intracranial findings. Electronically Signed   By: Staci Righter M.D.   On: 02/13/2017 11:38   ASSESSMENT AND PLAN:  [name]  is a 66 y.o. male with a known history of CAD status post CABG, repeat admission 2 months ago for chest pain and had a stent, admission and again at [facility] 2 weeks ago for coronary vasospasm and chest pain presents to the hospital today secondary to a syncopal episode.  #1 syncope-vasovagal syncope . -Cardiac enzymes 6 negative  -EKG sinus rhythm. No acute ST elevation or depression. -Cardiology input appreciated. No further cardiac workup needed -MRI brain negative -When necessary meclizine for dizziness. This could be very well from the fall with possible head concussion since he landed on the right hurt his head and chest wall -Physical therapy to ambulate  #2 stable angina with history of CAD-admitted 2 weeks ago for coronary vasospasm, now again with chest pain and an episode of syncope today. -Monitor on telemetry, recycle troponins. Cardiology consult appreciated -Continue cardiac medications including aspirin, brillinta, metoprolol and Imdur. -Patient has musculoskeletal chest pain secondary to fall  -when necessary pain meds  #3 hyperlipidemia-continue Zetia due to statin allergy  #4 tobacco use disorder-counseled. Has cut down. Continue nicotine patch  We'll get physical therapy evaluation for ambulation if patient continues to do well we'll consider discharge  later today   Case discussed with Care Management/Social Worker. Management plans discussed with the patient, family and they are in agreement.  CODE STATUS: full DVT Prophylaxis:lovenonx  TOTAL TIME TAKING CARE OF THIS PATIENT: 30 minutes.  >50% time spent on counselling and coordination of carept, wife, Dr Rockey Situ  POSSIBLE D/C IN 1 DAYS,  DEPENDING ON CLINICAL CONDITION.  Note: This dictation was prepared with Dragon dictation along with smaller phrase technology. Any transcriptional errors that result from this process are  unintentional.  Erina Hamme M.D on 02/13/2017 at 1:05 PM  Between 7am to 6pm - Pager - [phone]  After 6pm go to [email or web address] - password [email or web address] [facility] [place] [facility]  Office  [phone]  CC: Primary care physician; [name], MD

## 2017-02-14 ENCOUNTER — Observation Stay: Payer: Medicare Other

## 2017-02-14 DIAGNOSIS — I10 Essential (primary) hypertension: Secondary | ICD-10-CM | POA: Diagnosis not present

## 2017-02-14 DIAGNOSIS — I208 Other forms of angina pectoris: Secondary | ICD-10-CM | POA: Diagnosis not present

## 2017-02-14 DIAGNOSIS — I2510 Atherosclerotic heart disease of native coronary artery without angina pectoris: Secondary | ICD-10-CM | POA: Diagnosis not present

## 2017-02-14 DIAGNOSIS — I6523 Occlusion and stenosis of bilateral carotid arteries: Secondary | ICD-10-CM | POA: Diagnosis not present

## 2017-02-14 DIAGNOSIS — R55 Syncope and collapse: Secondary | ICD-10-CM | POA: Diagnosis not present

## 2017-02-14 DIAGNOSIS — I6509 Occlusion and stenosis of unspecified vertebral artery: Secondary | ICD-10-CM | POA: Diagnosis not present

## 2017-02-14 MED ORDER — IOPAMIDOL (ISOVUE-300) INJECTION 61%: 75.0000 mL | Freq: Once | INTRAVENOUS | Status: DC | PRN

## 2017-02-14 MED ORDER — TRAMADOL HCL 50 MG PO TABS: 100.0000 mg | ORAL_TABLET | Freq: Three times a day (TID) | ORAL | 0 refills | Status: DC | PRN

## 2017-02-14 MED ORDER — IOPAMIDOL (ISOVUE-370) INJECTION 76%
75.0000 mL | Freq: Once | INTRAVENOUS | Status: AC | PRN
  Administered 2017-02-14: 75 mL via INTRAVENOUS

## 2017-02-14 MED ORDER — METOPROLOL TARTRATE 25 MG PO TABS
12.5000 mg | ORAL_TABLET | Freq: Two times a day (BID) | ORAL | Status: DC
  Administered 2017-02-14: 12.5 mg via ORAL
  Filled 2017-02-14: qty 1

## 2017-02-14 MED ORDER — MECLIZINE HCL 25 MG PO TABS: 25.0000 mg | ORAL_TABLET | Freq: Three times a day (TID) | ORAL | 0 refills | Status: DC | PRN

## 2017-02-14 NOTE — Care Management (Signed)
Provided patient's wife with list of agencies that can provide continuous in home care.  Physical therapy has recommend outpatient therapy for vestibular rehab

## 2017-02-14 NOTE — Progress Notes (Signed)
Patient discharged as ordered,instructions explained and well understood,follow up appointment made,prescriptions given,vital signs within normal limits,escorted by spouse and staff member via wheel chair.

## 2017-02-14 NOTE — Discharge Summary (Addendum)
[place] at [place] NAME: [name]    MR#:  [id number]  DATE OF BIRTH:  [birth year]  DATE OF ADMISSION:  02/12/2017 ADMITTING PHYSICIAN: [name], MD  DATE OF DISCHARGE: 02/14/17  PRIMARY CARE PHYSICIAN: [name], MD    ADMISSION DIAGNOSIS:  Syncope, unspecified syncope type [R55] Chest pain, unspecified type [R07.9]  DISCHARGE DIAGNOSIS:  Syncope unclear etiology Right facial/chest wall and right knee contusion after fall Dizziness suspected due to head concussion after fall Severe bilateral vertebral artery stenosis on CTA neck and Bilateral mild to moderate CA stenosis on CTA neck---f/u vascular sx  SECONDARY DIAGNOSIS:   Past Medical History:  Diagnosis Date  . Cancer [facility])    history of prostate cancer  . COPD (chronic obstructive pulmonary disease) ([place])   . Coronary artery disease    a. 2008 s/p CABG x 3 [facility]): LIMA->OM, RIMA->Diag, VG->RPDA;  b. 05/2016 NSTEMI/PCI: DES ->prox LAD and ost diag;  c. 12/2016 Cath/PCI: LM 95d, LAD 95ost (2.75x8 Promus Premier DES), patent prox stent, D2 30, LCX nl, OM2 95, RCA min irregs, LIMA->OM2 ok, RIMA->D2 85 "kink"-improved w/ IC NTG, VG->RPDA 100.  [facility] Kitchen GERD (gastroesophageal reflux disease)   . Hyperlipidemia   . Hypertension   . Mitral regurgitation    a. mod by cath 12/2016.  . Statin intolerance   . Tobacco abuse     HOSPITAL COURSE:   [name] a 66 y.o. malewith a known history of CAD status post CABG, repeat admission 2 months ago for chest pain and had a stent, admission and again at [facility] 2 weeks ago for coronary vasospasm and chest pain presents to the hospital today secondary to a syncopal episode.  #1 syncope-vasovagal syncope . -Cardiac enzymes 6 negative  -EKG sinus rhythm. No acute ST elevation or depression. -Cardiology input appreciated. No further cardiac workup needed -MRI brain negative -When necessary meclizine for dizziness.  This could be very well from the fall with possible head concussion since he landed on the right hurt his head and chest wall -Physical therapy to ambulate---recommends out pt PT with vestibular therapy -US carotid shows bilateral 59-60% atherosclerosis. Will get CTA neck to assess.  #2 stable angina with history of CAD-admitted 2 weeks ago for coronary vasospasm -Cardiology consult appreciated -Continue cardiac medications including aspirin, brillinta,metoprolol and Imdur. -Patient has musculoskeletal chest pain secondary to fall  -when necessary pain meds  #3 hyperlipidemia-continue Zetia due to statin allergy  #4 tobacco use disorder-counseled. Has cut down. Continue nicotine patch  # 5 mild to moderate carotid artery stenosis with bilateral severe vertebral artery stenosis noted on CTA of the neck -Patient already is on aspirin and Brilinta. He is on Zetia also. -I  discussed above results with Dr. Delana Meyer vascular surgery--recommends outpatient follow-up for carotid artery disease. At this point for vertebral stenosis continue medical management no intervention is doable. This was discussed at length with patient he voiced understanding.  Discharge patient home Patient is instructed not to drive till dizziness improves and he will need Clearance from his PCP. Pt and wife voiced understanding. CONSULTS OBTAINED:  Treatment Team:  Minna Merritts, MD  DRUG ALLERGIES:   Allergies  Allergen Reactions  . Clopidogrel Other (See Comments)    Other reaction(s): GI Upset (intolerance) Other reaction(s): GI Upset (intolerance)  . Codeine Other (See Comments)    Other reaction(s): GI Upset (intolerance) Pt not sure  . Hydrocodone-Acetaminophen Other (See Comments)  Other reaction(s): GI Upset (intolerance)  . Simvastatin Other (See Comments)    Other reaction(s): GI Upset (intolerance)  . Influenza Vaccines Nausea And Vomiting    DISCHARGE MEDICATIONS:   Current  Discharge Medication List    START taking these medications   Details  meclizine (ANTIVERT) 25 MG tablet Take 1 tablet (25 mg total) by mouth 3 (three) times daily as needed for dizziness. Qty: 30 tablet, Refills: 0    traMADol (ULTRAM) 50 MG tablet Take 2 tablets (100 mg total) by mouth every 8 (eight) hours as needed. Qty: 25 tablet, Refills: 0      CONTINUE these medications which have NOT CHANGED   Details  aspirin 81 MG tablet Take 1 tablet (81 mg total) by mouth daily.    ezetimibe (ZETIA) 10 MG tablet Take 1 tablet (10 mg total) by mouth daily. Qty: 30 tablet, Refills: 6    isosorbide mononitrate (IMDUR) 30 MG 24 hr tablet Take 1 tablet (30 mg total) by mouth daily. Qty: 90 tablet, Refills: 3    metoprolol tartrate (LOPRESSOR) 25 MG tablet Take 1 tablet (25 mg total) by mouth 2 (two) times daily. Qty: 90 tablet, Refills: 3    pantoprazole (PROTONIX) 20 MG tablet Take 1 tablet (20 mg total) by mouth daily. Qty: 30 tablet, Refills: 1    ticagrelor (BRILINTA) 90 MG TABS tablet Take 1 tablet (90 mg total) by mouth 2 (two) times daily. Qty: 60 tablet, Refills: 6    nicotine (NICODERM CQ - DOSED IN MG/24 HOURS) 14 mg/24hr patch Place 1 patch (14 mg total) onto the skin daily. Qty: 30 patch, Refills: 0    nitroGLYCERIN (NITROSTAT) 0.4 MG SL tablet Place 1 tablet (0.4 mg total) under the tongue as needed for chest pain. X 3 doses Qty: 25 tablet, Refills: 3      STOP taking these medications     predniSONE (DELTASONE) 20 MG tablet         If you experience worsening of your admission symptoms, develop shortness of breath, life threatening emergency, suicidal or homicidal thoughts you must seek medical attention immediately by calling 911 or calling your MD immediately  if symptoms less severe.  You Must read complete instructions/literature along with all the possible adverse reactions/side effects for all the Medicines you take and that have been prescribed to you. Take  any new Medicines after you have completely understood and accept all the possible adverse reactions/side effects.   Please note  You were cared for by a hospitalist during your hospital stay. If you have any questions about your discharge medications or the care you received while you were in the hospital after you are discharged, you can call the unit and asked to speak with the hospitalist on call if the hospitalist that took care of you is not available. Once you are discharged, your primary care physician will handle any further medical issues. Please note that NO REFILLS for any discharge medications will be authorized once you are discharged, as it is imperative that you return to your primary care physician (or establish a relationship with a primary care physician if you do not have one) for your aftercare needs so that they can reassess your need for medications and monitor your lab values. Today   SUBJECTIVE   Feels a bit better with dizziness and chest wall pain No nausea  VITAL SIGNS:  Blood pressure 126/69, pulse (!) 56, temperature (!) 97.5 F (36.4 C), temperature source Oral, resp. rate  16, height 5\' 11"  (1.803 m), weight 95.3 kg (210 lb), SpO2 98 %.  I/O:   Intake/Output Summary (Last 24 hours) at 02/14/17 0820 Last data filed at 02/14/17 0524  Gross per 24 hour  Intake              720 ml  Output              475 ml  Net              245 ml    PHYSICAL EXAMINATION:  GENERAL:  66 y.o.-year-old patient lying in the bed with no acute distress.  EYES: Pupils equal, round, reactive to light and accommodation. No scleral icterus. Extraocular muscles intact.  HEENT: Head atraumatic, normocephalic. Oropharynx and nasopharynx clear.  NECK:  Supple, no jugular venous distention. No thyroid enlargement, no tenderness.  LUNGS: Normal breath sounds bilaterally, no wheezing, rales,rhonchi or crepitation. No use of accessory muscles of respiration.  CARDIOVASCULAR: S1, S2 normal.  No murmurs, rubs, or gallops.  ABDOMEN: Soft, non-tender, non-distended. Bowel sounds present. No organomegaly or mass.  EXTREMITIES: No pedal edema, cyanosis, or clubbing.  NEUROLOGIC: Cranial nerves II through XII are intact. Muscle strength 5/5 in all extremities. Sensation intact. Gait not checked.  PSYCHIATRIC: The patient is alert and oriented x 3.  SKIN: No obvious rash, lesion, or ulcer.   DATA REVIEW:   CBC   Recent Labs Lab 02/13/17 0703  WBC 7.3  HGB 12.8*  HCT 36.0*  PLT 244    Chemistries   Recent Labs Lab 02/12/17 0950 02/13/17 0703  NA 137 138  K 4.2 4.1  CL 106 106  CO2 22 24  GLUCOSE 121* 95  BUN 13 19  CREATININE 1.21 1.13  CALCIUM 9.0 8.6*  MG 2.2  --     Microbiology Results   No results found for this or any previous visit (from the past 240 hour(s)).  RADIOLOGY:  Dg Chest 1 View  Result Date: 02/12/2017 CLINICAL DATA:  Chest pressure, syncope EXAM: CHEST 1 VIEW COMPARISON:  01/25/2017 FINDINGS: Heart and mediastinal contours are within normal limits. No focal opacities or effusions. No acute bony abnormality. IMPRESSION: No active disease. Electronically Signed   By: Rolm Baptise M.D.   On: 02/12/2017 09:29   Ct Head Wo Contrast  Result Date: 02/12/2017 CLINICAL DATA:  Dizziness, frontal headache EXAM: CT HEAD WITHOUT CONTRAST TECHNIQUE: Contiguous axial images were obtained from the base of the skull through the vertex without intravenous contrast. COMPARISON:  10/27/2016 FINDINGS: Brain: No evidence of acute infarction, hemorrhage, hydrocephalus, extra-axial collection or mass lesion/mass effect. Vascular: No hyperdense vessel or unexpected calcification. Skull: Normal. Negative for fracture or focal lesion. Sinuses/Orbits: The visualized paranasal sinuses are essentially clear. The mastoid air cells are unopacified. Other: Three benign subcutaneous lesions, including a dominant 1.4 cm lesion in the left frontal lobe, likely reflecting dermal  inclusion cysts, mildly increased. IMPRESSION: Normal head CT. Electronically Signed   By: Julian Hy M.D.   On: 02/12/2017 09:53   Mr Brain Wo Contrast  Result Date: 02/13/2017 CLINICAL DATA:  Syncopal episode. Swimmy headed. Vomiting with diaphoresis. EXAM: MRI HEAD WITHOUT CONTRAST TECHNIQUE: Multiplanar, multiecho pulse sequences of the brain and surrounding structures were obtained without intravenous contrast. COMPARISON:  CT head 02/12/2017.  MR head 04/26/2008. FINDINGS: Brain: No evidence for acute infarction, hemorrhage, mass lesion, hydrocephalus, or extra-axial fluid. Mild atrophy. Minor white matter signal abnormality, likely small vessel disease. Vascular: Flow voids are maintained throughout the  carotid, basilar, and vertebral arteries. There are no areas of chronic hemorrhage. Skull and upper cervical spine: Unremarkable visualized calvarium, skullbase, and cervical vertebrae. Pituitary, pineal, cerebellar tonsils unremarkable. No upper cervical cord lesions. Sinuses/Orbits: No orbital masses or proptosis. Globes appear symmetric. Sinuses appear well aerated, without evidence for air-fluid level. Other: Compared with prior MR, slight progression of atrophy. IMPRESSION: Atrophy and small vessel disease.  No acute intracranial findings. Electronically Signed   By: Staci Righter M.D.   On: 02/13/2017 11:38   US Carotid Bilateral  Result Date: 02/13/2017 CLINICAL DATA:  Chest pain. Syncopal episode. His area, CAD (post myocardial infarction and coronary stent placement), visual disturbance, hyperlipidemia and smoking. EXAM: BILATERAL CAROTID DUPLEX ULTRASOUND TECHNIQUE: Brubacher scale imaging, color Doppler and duplex ultrasound were performed of bilateral carotid and vertebral arteries in the neck. COMPARISON:  None. FINDINGS: Criteria: Quantification of carotid stenosis is based on velocity parameters that correlate the residual internal carotid diameter with NASCET-based stenosis levels,  using the diameter of the distal internal carotid lumen as the denominator for stenosis measurement. The following velocity measurements were obtained: RIGHT ICA:  205/52 cm/sec CCA:  75/64 cm/sec SYSTOLIC ICA/CCA RATIO:  2.4 DIASTOLIC ICA/CCA RATIO:  2.5 ECA:  150 cm/sec LEFT ICA:  132/38 cm/sec CCA:  332/95 cm/sec SYSTOLIC ICA/CCA RATIO:  0.8 DIASTOLIC ICA/CCA RATIO:  1.1 ECA:  126 cm/sec RIGHT CAROTID ARTERY: There is a large amount of eccentric mixed echogenic plaque within the right carotid bulb (images 15 and 17), extending to involve the origin and proximal aspect the right internal carotid artery (is 25), which results in elevated peak systolic velocities within the proximal aspect the right internal carotid artery (greatest acquired peak systolic velocity with the proximal right ICA measures 205 cm/sec - image 27). RIGHT VERTEBRAL ARTERY:  Antegrade Flow LEFT CAROTID ARTERY: There is a moderate amount of eccentric hypoechoic plaque within the mid and distal aspects of the left common carotid artery (images 44 and 48). There is a moderate to large amount of eccentric mixed echogenic plaque within the left carotid bulb (image 53), extending to involve the origin and proximal aspect the left internal carotid artery (image 61, resulting in elevated peak systolic velocities within the mid aspect the left internal carotid artery (greatest acquired peak systolic velocity within mid ICA measures 132 cm/sec -image 67). LEFT VERTEBRAL ARTERY:  Antegrade Flow IMPRESSION: Moderate to large amount of bilateral atherosclerotic plaque, right greater than left, results in elevated peak systolic velocities within the bilateral internal carotid arteries compatible with the 50-69% luminal narrowing range bilaterally. Further evaluation with CTA could performed as clinically indicated. Electronically Signed   By: Sandi Mariscal M.D.   On: 02/13/2017 13:21     Management plans discussed with the patient, family and they are in  agreement.  CODE STATUS:     Code Status Orders        Start     Ordered   02/12/17 1441  Full code  Continuous     02/12/17 1440    Code Status History    Date Active Date Inactive Code Status Order ID Comments User Context   01/28/2017  4:51 PM 01/29/2017  3:24 PM Full Code [id number]  Cheryln Manly, NP Inpatient   12/26/2016 11:50 AM 12/29/2016 12:14 PM Full Code [id number]  Cheryln Manly, NP Inpatient    Advance Directive Documentation     Most Recent Value  Type of Advance Directive  Healthcare Power of [place], Living will  Pre-existing  out of facility DNR order (yellow form or pink MOST form)  -  "MOST" Form in Place?  -      TOTAL TIME TAKING CARE OF THIS PATIENT: *40* minutes.    Gamal Todisco M.D on 02/14/2017 at 8:20 AM  Between 7am to 6pm - Pager - [phone] After 6pm go to [email or web address] - password [email or web address] [place] [facility]  Office  [phone]  CC: Primary care physician; [name], MD

## 2017-02-14 NOTE — Progress Notes (Signed)
Physical Therapy Treatment Patient Details Name: [name] MRN: [id number] DOB: 1951/01/29 Today's Date: 02/14/2017    History of Present Illness [name]  is a 66 y.o. male with a known history of CAD status post CABG, repeat admission 2 months ago for chest pain and had a stent, admission and again at [facility] 2 weeks ago for coronary vasospasm and chest pain presents to the hospital today secondary to a syncopal episode. Patient states that he has been doing well since his discharge 2 weeks ago. This morning he woke up around 4:30 AM with chest pain with radiation to his left arm. He laid in bed and took some deep breaths with relief and went back to sleep. When he woke up again in the morning he did not have any more chest pain. He went to work and was sitting and suddenly felt swimmy headed and had passed out. People who witnessed the fall, denied any seizure-like activity. Patient did not have any chest pain prior to the fall or after he woke up. He was likely out for less than a minute, was taken inside and then he started to feel nauseous and had vomiting episodes associated with some diaphoresis. His first troponin here is negative. He denies any dehydration, recent illnesses, change in his medications. He is being admitted under observation for recurrent chest pain and new syncopal episode. Pt is now having dizziness with attempts to sit and stand. PT consultation requested for gait instability.     PT Comments    Pt demonstrates slight improvement in mobility today. Therapist encouraged pt to move slowly during bed mobility and transfers. He stumbles during transfers but with support on walker and bed he is able to stabilize without external support. He continues to stagger during ambulation and that worsens with increased gait distance. Wife assist with ambulation so that therapist can educate her on safe guarding techniques. Pt advised to use walker at all times and have guarding whenever  ambulating. Wife encouraged to use a belt around the waist in place of a gait belt. Pt mostly able to self correct when walking but does require intermittent external assist. Strength testing in bilateral UE/LE repeated without any gross weakness appreciated with the exception of some R grip strength weakness. L side mouth droop appears to be improving as well today. Etiology of symptoms unclear but MD has discussed concussion related to fall as part of differential. Educated pt and family regarding decreased light, screens, and stimulation at home during recovery. Requested MD have nursing staff provide pt with concussion education if that remains part of patient's differential upon discharge. Pt will need 24/7 supervision for all OOB mobility. Recommend BSC to minimize walking distances during the day and at night until steadiness improves. MD emphasized to pt and wife during PT treatment session that pt is not to drive. If dizziness and instability persist pt may need referral for OP vestibular therapy services.    Follow Up Recommendations  Outpatient PT;Other (comment);Supervision for mobility/OOB (Vestibular therapy if dizziness/instability persists)     Equipment Recommendations  Other (comment);3in1 (PT) (Pt should use his rolling walker at discharge)    Recommendations for Other Services       Precautions / Restrictions Precautions Precautions: Fall Restrictions Weight Bearing Restrictions: No    Mobility  Bed Mobility Overal bed mobility: Modified Independent             General bed mobility comments: HOB elevated and use of bed rails. Increased time  required  Transfers Overall transfer level: Needs assistance Equipment used: Rolling walker (2 wheeled) Transfers: Sit to/from Stand Sit to Stand: Min assist         General transfer comment: Improved stability today. Pt encouraged to move slowly. He stumbles during transfer but with support on walker and bed he is able to  stabilize  Ambulation/Gait Ambulation/Gait assistance: +2 safety/equipment;Min assist Ambulation Distance (Feet): 120 Feet Assistive device: Rolling walker (2 wheeled) Gait Pattern/deviations: Decreased step length - right;Decreased step length - left;Staggering left;Staggering right Gait velocity: Decreased Gait velocity interpretation: <1.8 ft/sec, indicative of risk for recurrent falls General Gait Details: Pt demonstrates mild improvement in stability with ambulation today. He does continue to stagger and that worsens with increased distance. Wife assist with ambulation so that therapist can educate her on safe guarding techniques. Pt advised to use walker at all times and have guarding whenever ambulating. Wife encouraged to use a belt around the waist in place of a gait belt   Stairs            Wheelchair Mobility    Modified Rankin (Stroke Patients Only)       Balance Overall balance assessment: Needs assistance Sitting-balance support: No upper extremity supported Sitting balance-Leahy Scale: Good     Standing balance support: No upper extremity supported Standing balance-Leahy Scale: Poor Standing balance comment: Pt able to stabilize with UE support but without support he is very unsteady                            Cognition Arousal/Alertness: Lethargic Behavior During Therapy: Flat affect Overall Cognitive Status: Within Functional Limits for tasks assessed                                 General Comments: Pt with occasional delayed response. Very flat affect      Exercises      General Comments        Pertinent Vitals/Pain Pain Assessment: No/denies pain    Home Living                      Prior Function            PT Goals (current goals can now be found in the care plan section) Acute Rehab PT Goals Patient Stated Goal: Return to prior level of function PT Goal Formulation: With patient/family Time For  Goal Achievement: 02/27/17 Potential to Achieve Goals: Good Progress towards PT goals: Progressing toward goals    Frequency    Min 2X/week      PT Plan Current plan remains appropriate    Co-evaluation              AM-PAC PT "6 Clicks" Daily Activity  Outcome Measure  Difficulty turning over in bed (including adjusting bedclothes, sheets and blankets)?: A Little Difficulty moving from lying on back to sitting on the side of the bed? : A Little Difficulty sitting down on and standing up from a chair with arms (e.g., wheelchair, bedside commode, etc,.)?: A Lot Help needed moving to and from a bed to chair (including a wheelchair)?: A Little Help needed walking in hospital room?: A Lot Help needed climbing 3-5 steps with a railing? : A Lot 6 Click Score: 15    End of Session Equipment Utilized During Treatment: Gait belt Activity Tolerance: Patient tolerated treatment well Patient  left: in bed;with call bell/phone within reach;with bed alarm set;with family/visitor present Nurse Communication: Mobility status;Other (comment) (Discussed instability with MD) PT Visit Diagnosis: Unsteadiness on feet (R26.81);Other abnormalities of gait and mobility (R26.89);Difficulty in walking, not elsewhere classified (R26.2)     Time: 2763-9432 PT Time Calculation (min) (ACUTE ONLY): 15 min  Charges:  $Gait Training: 8-22 mins                    G Codes:       Lyndel Safe Wilian Kwong PT, DPT     Wolfgang Finigan 02/14/2017, 8:59 AM

## 2017-02-14 NOTE — Plan of Care (Signed)
Problem: Pain Managment: Goal: General experience of comfort will improve Outcome: Not Progressing Pt continues to have chest and head pain, pt is on scheduled tramadol, which is helping.  Problem: Activity: Goal: Risk for activity intolerance will decrease Outcome: Progressing Pt up to chair with one assist, still very unsteady on his feet and gets nauseated.

## 2017-02-14 NOTE — Discharge Instructions (Signed)
Patient advised NOT to drive till he is cleared by his PCP.

## 2017-02-14 NOTE — Progress Notes (Signed)
Patient Name: [name] Date of Encounter: 02/14/2017  Primary Cardiologist: [facility] Problem List     Active Problems:   Syncope     Subjective   Continues to have intermittent chest pain at rest that is reproducible to palpation on exam. No concerning arrhythmias or pauses noted on telemetry.   Inpatient Medications    Scheduled Meds: . aspirin  81 mg Oral Daily  . enoxaparin (LOVENOX) injection  40 mg Subcutaneous Q24H  . ezetimibe  10 mg Oral Daily  . isosorbide mononitrate  30 mg Oral Daily  . meclizine  25 mg Oral TID  . metoprolol tartrate  25 mg Oral BID  . nicotine  14 mg Transdermal Daily  . pantoprazole  40 mg Oral Daily  . ticagrelor  90 mg Oral BID  . traMADol  100 mg Oral Q6H   Continuous Infusions:  PRN Meds: acetaminophen **OR** acetaminophen, HYDROmorphone (DILAUDID) injection, nitroGLYCERIN, ondansetron **OR** ondansetron (ZOFRAN) IV   Vital Signs    Vitals:   02/13/17 1514 02/13/17 1943 02/14/17 0428 02/14/17 0730  BP: 128/71 136/61 (!) 148/68 126/69  Pulse: 70 66 (!) 56 (!) 56  Resp:  15 17 16   Temp:  97.8 F (36.6 C) (!) 97.4 F (36.3 C) (!) 97.5 F (36.4 C)  TempSrc:  Oral Oral Oral  SpO2: 98% 100% 100% 98%  Weight:      Height:        Intake/Output Summary (Last 24 hours) at 02/14/17 0743 Last data filed at 02/14/17 0524  Gross per 24 hour  Intake              720 ml  Output              475 ml  Net              245 ml   Filed Weights   02/12/17 0912  Weight: 210 lb (95.3 kg)    Physical Exam    GEN: Well nourished, well developed, in no acute distress. Snoring upon walking into room. HEENT: Grossly normal.  Neck: Supple, no JVD, carotid bruits, or masses. Cardiac: Bradycardic, no murmurs, rubs, or gallops. No clubbing, cyanosis, edema.  Radials/DP/PT 2+ and equal bilaterally.  Respiratory:  Respirations regular and unlabored, clear to auscultation bilaterally. GI: Soft, nontender, nondistended, BS + x  4. MS: no deformity or atrophy. Skin: warm and dry, no rash. Neuro:  Strength and sensation are intact. Psych: AAOx3.  Normal affect.  Labs    CBC  Recent Labs  02/12/17 0911 02/13/17 0703  WBC 10.9* 7.3  NEUTROABS 8.1*  --   HGB 13.5 12.8*  HCT 38.9* 36.0*  MCV 92.7 90.6  PLT 296 660   Basic Metabolic Panel  Recent Labs  02/12/17 0950 02/13/17 0703  NA 137 138  K 4.2 4.1  CL 106 106  CO2 22 24  GLUCOSE 121* 95  BUN 13 19  CREATININE 1.21 1.13  CALCIUM 9.0 8.6*  MG 2.2  --    Liver Function Tests No results for input(s): AST, ALT, ALKPHOS, BILITOT, PROT, ALBUMIN in the last 72 hours. No results for input(s): LIPASE, AMYLASE in the last 72 hours. Cardiac Enzymes  Recent Labs  02/13/17 0112 02/13/17 0349 02/13/17 0703  TROPONINI <0.03 <0.03 <0.03   BNP Invalid input(s): POCBNP D-Dimer No results for input(s): DDIMER in the last 72 hours. Hemoglobin A1C No results for input(s): HGBA1C in the last 72 hours. Fasting Lipid  Panel No results for input(s): CHOL, HDL, LDLCALC, TRIG, CHOLHDL, LDLDIRECT in the last 72 hours. Thyroid Function Tests No results for input(s): TSH, T4TOTAL, T3FREE, THYROIDAB in the last 72 hours.  Invalid input(s): FREET3  Telemetry    Sinus bradycardia, upper 40s to 50s bpm - Personally Reviewed  ECG    n/a - Personally Reviewed  Radiology    Dg Chest 1 View  Result Date: 02/12/2017 IMPRESSION: No active disease. Electronically Signed   By: Rolm Baptise M.D.   On: 02/12/2017 09:29   Ct Head Wo Contrast  Result Date: 02/12/2017 IMPRESSION: Normal head CT. Electronically Signed   By: Julian Hy M.D.   On: 02/12/2017 09:53   Mr Brain Wo Contrast  Result Date: 02/13/2017 IMPRESSION: Atrophy and small vessel disease.  No acute intracranial findings. Electronically Signed   By: Staci Righter M.D.   On: 02/13/2017 11:38   US Carotid Bilateral  Result Date: 02/13/2017 IMPRESSION: Moderate to large amount of  bilateral atherosclerotic plaque, right greater than left, results in elevated peak systolic velocities within the bilateral internal carotid arteries compatible with the 50-69% luminal narrowing range bilaterally. Further evaluation with CTA could performed as clinically indicated. Electronically Signed   By: Sandi Mariscal M.D.   On: 02/13/2017 13:21    Cardiac Studies   n/a  Patient Profile     66 y.o. male with history of CAD (s/p CABG in 2008, DES to D1 in 05/2016, cath in 12/2016 showing patent LIMA-2nd Mrg, RIMA -2nd Diag with "kink" in the bend of the graft which resolved with IC NTG, requiring DES to ostial LAD two days later for recurrent anginal symptoms), HTN, HLD,and mitral regurgitationwho is being seen today for the evaluation of syncope and chest pain.  Assessment & Plan    1. Syncope: -Uncertain etiology  -Possible vasovagal episode in the setting of leg pain -Telemetry has been unrevealing -Plan for 30-day event monitor as an outpatient -Ruled out -Orthostatics negative -PT  2. Atypical chest pain: -Reproducible to palpation -Symptom-free currently -Ruled out -No plans for inpatient ischemia evaluation  3. CAD: -Chest pain free -Continue ASA, Brilinta, metoprolol (decreased dose as below given bradycardia), Imdur, and Zetia -Consider trial of high-intensity statin (would be needed prior to PCSK9 inhibitor)  4. Sinus bradycardia: -Snoring when I entered the room -He reports prior sleep study years ago in which he was advised to wear a CPAP, he is not compliant with this -Decrease metoprolol to 12.5 mg bid -Needs outpatient sleep study  5. HLD: -Goal LDL < 70 -As above  Signed, Christell Faith, PA-C [facility] Pager: [phone] 02/14/2017, 7:43 AM

## 2017-02-20 DIAGNOSIS — Z6829 Body mass index (BMI) 29.0-29.9, adult: Secondary | ICD-10-CM | POA: Diagnosis not present

## 2017-02-20 DIAGNOSIS — I676 Nonpyogenic thrombosis of intracranial venous system: Secondary | ICD-10-CM | POA: Diagnosis not present

## 2017-02-20 DIAGNOSIS — R42 Dizziness and giddiness: Secondary | ICD-10-CM | POA: Diagnosis not present

## 2017-02-26 ENCOUNTER — Other Ambulatory Visit: Payer: Medicare Other

## 2017-03-03 DIAGNOSIS — R110 Nausea: Secondary | ICD-10-CM | POA: Diagnosis not present

## 2017-03-03 DIAGNOSIS — S80862A Insect bite (nonvenomous), left lower leg, initial encounter: Secondary | ICD-10-CM | POA: Diagnosis not present

## 2017-03-03 DIAGNOSIS — D509 Iron deficiency anemia, unspecified: Secondary | ICD-10-CM | POA: Diagnosis not present

## 2017-03-03 DIAGNOSIS — I2510 Atherosclerotic heart disease of native coronary artery without angina pectoris: Secondary | ICD-10-CM | POA: Diagnosis not present

## 2017-03-04 ENCOUNTER — Ambulatory Visit: Payer: Medicare Other | Admitting: Cardiology

## 2017-03-04 DIAGNOSIS — R1084 Generalized abdominal pain: Secondary | ICD-10-CM | POA: Diagnosis not present

## 2017-03-04 DIAGNOSIS — I252 Old myocardial infarction: Secondary | ICD-10-CM | POA: Diagnosis not present

## 2017-03-04 DIAGNOSIS — R109 Unspecified abdominal pain: Secondary | ICD-10-CM | POA: Diagnosis not present

## 2017-03-04 DIAGNOSIS — K922 Gastrointestinal hemorrhage, unspecified: Secondary | ICD-10-CM | POA: Diagnosis not present

## 2017-03-04 DIAGNOSIS — I10 Essential (primary) hypertension: Secondary | ICD-10-CM | POA: Diagnosis not present

## 2017-03-05 ENCOUNTER — Telehealth: Payer: Self-pay | Admitting: Cardiology

## 2017-03-05 DIAGNOSIS — D649 Anemia, unspecified: Secondary | ICD-10-CM | POA: Diagnosis not present

## 2017-03-05 DIAGNOSIS — I2510 Atherosclerotic heart disease of native coronary artery without angina pectoris: Secondary | ICD-10-CM | POA: Diagnosis not present

## 2017-03-05 DIAGNOSIS — K5792 Diverticulitis of intestine, part unspecified, without perforation or abscess without bleeding: Secondary | ICD-10-CM | POA: Diagnosis not present

## 2017-03-05 DIAGNOSIS — K921 Melena: Secondary | ICD-10-CM | POA: Diagnosis not present

## 2017-03-05 DIAGNOSIS — Z0000 Encounter for general adult medical examination without abnormal findings: Secondary | ICD-10-CM | POA: Diagnosis not present

## 2017-03-05 NOTE — Telephone Encounter (Signed)
The pt states that he has been approved for pt assistance for Brilinta through [employer] and is asking [place] to contact the company and order his Brilinta. I am unaware of the pt applying for pt assistance with [employer] to get his Brilinta from the company  According to [place], PA-c at the pts last OV 01/15/17: He was given addition samples of Brilinta. He has filled out medication assistance form and our office will fax today. He was instructed to notify us if he dose not qualify and if he needs additional medications before running out. If he is not approved, we will need to switch to Plavix.   I called [employer] and was advised by Ned Grace that the pt was temporarily approved to receive a 60 day supply of Brilinta and that they have reached out to him asking him to send them proof of his income.  I called the pt back and explained to him that [employer] is needing his proof of income and that once they receive that info they can move forward with decision as to whether he will be able to receive his Brilinta through them or not.  The pt stated "oh yeah, I forgot".  I advised him to get a copy of his proof of income and mail it back to [employer] or to bring it to me at this office and I will be happy to fax it to [employer] for him. He verbalized understanding and states that he will get his proof of income and bring it to the office tomorrow.  Also, the pt is aware that I will not be in the office tomorrow and that when he drops off his proof of income to ask for Brilinta samples and that if we have them will will give him a couple bottles to take until this matter is resolved.

## 2017-03-05 NOTE — Telephone Encounter (Signed)
°*  STAT* If patient is at the pharmacy, call can be transferred to refill team.   1. Which medications need to be refilled? (please list name of each medication and dose if known) berlinta 90mg  2. Which pharmacy/location (including street and city if local pharmacy) is medication to be sent to? mail 3. Do they need a 30 day or 90 day supply? Faith

## 2017-03-08 ENCOUNTER — Telehealth: Payer: Self-pay | Admitting: Cardiology

## 2017-03-08 NOTE — Telephone Encounter (Signed)
Walk in Pt Form-patient calling regarding meds. Placed in [employer].

## 2017-03-11 ENCOUNTER — Other Ambulatory Visit: Payer: Self-pay

## 2017-03-11 ENCOUNTER — Emergency Department (HOSPITAL_COMMUNITY): Payer: Medicare Other

## 2017-03-11 ENCOUNTER — Encounter (HOSPITAL_COMMUNITY): Payer: Self-pay | Admitting: Emergency Medicine

## 2017-03-11 ENCOUNTER — Emergency Department (HOSPITAL_COMMUNITY)
Admission: EM | Admit: 2017-03-11 | Discharge: 2017-03-11 | Disposition: A | Discharge status: Home / Self Care | Payer: Medicare Other | Attending: Emergency Medicine | Admitting: Emergency Medicine

## 2017-03-11 DIAGNOSIS — R9431 Abnormal electrocardiogram [ECG] [EKG]: Secondary | ICD-10-CM | POA: Diagnosis not present

## 2017-03-11 DIAGNOSIS — Z5321 Procedure and treatment not carried out due to patient leaving prior to being seen by health care provider: Secondary | ICD-10-CM | POA: Insufficient documentation

## 2017-03-11 DIAGNOSIS — R079 Chest pain, unspecified: Secondary | ICD-10-CM | POA: Insufficient documentation

## 2017-03-11 DIAGNOSIS — R0602 Shortness of breath: Secondary | ICD-10-CM | POA: Diagnosis not present

## 2017-03-11 LAB — BASIC METABOLIC PANEL
Anion gap: 7 (ref 5–15)
BUN: 10 mg/dL (ref 6–20)
CALCIUM: 9 mg/dL (ref 8.9–10.3)
CO2: 26 mmol/L (ref 22–32)
CREATININE: 1.21 mg/dL (ref 0.61–1.24)
Chloride: 106 mmol/L (ref 101–111)
GFR calc Af Amer: >60 mL/min (ref >60)
GLUCOSE: 181 mg/dL — AB (ref 65–99)
Potassium: 4.5 mmol/L (ref 3.5–5.1)
Sodium: 139 mmol/L (ref 135–145)

## 2017-03-11 LAB — CBC
HCT: 36.1 % — ABNORMAL LOW (ref 39.0–52.0)
Hemoglobin: 11.7 g/dL — ABNORMAL LOW (ref 13.0–17.0)
MCH: 29.3 pg (ref 26.0–34.0)
MCHC: 32.4 g/dL (ref 30.0–36.0)
MCV: 90.3 fL (ref 78.0–100.0)
PLATELETS: 336 10*3/uL (ref 150–400)
RBC: 4 MIL/uL — ABNORMAL LOW (ref 4.22–5.81)
RDW: 13.4 % (ref 11.5–15.5)
WBC: 7.3 10*3/uL (ref 4.0–10.5)

## 2017-03-11 LAB — I-STAT TROPONIN, ED: TROPONIN I, POC: 0 ng/mL (ref 0.00–0.08)

## 2017-03-11 NOTE — ED Triage Notes (Signed)
Pt c/o chest pain that radiates to the left arm. and n/v x 1 week. States doctor instructed pt to come in. Family reports blood count "down to 11".

## 2017-03-11 NOTE — ED Notes (Signed)
Pt reports that he is leaving and is going somewhere else to be seen due to wait times.

## 2017-03-12 ENCOUNTER — Telehealth: Payer: Self-pay | Admitting: Cardiology

## 2017-03-12 DIAGNOSIS — E860 Dehydration: Secondary | ICD-10-CM | POA: Diagnosis not present

## 2017-03-12 DIAGNOSIS — E669 Obesity, unspecified: Secondary | ICD-10-CM | POA: Diagnosis not present

## 2017-03-12 DIAGNOSIS — R42 Dizziness and giddiness: Secondary | ICD-10-CM | POA: Diagnosis not present

## 2017-03-12 DIAGNOSIS — M25552 Pain in left hip: Secondary | ICD-10-CM | POA: Diagnosis not present

## 2017-03-12 DIAGNOSIS — R531 Weakness: Secondary | ICD-10-CM | POA: Diagnosis not present

## 2017-03-12 DIAGNOSIS — Z951 Presence of aortocoronary bypass graft: Secondary | ICD-10-CM | POA: Diagnosis not present

## 2017-03-12 DIAGNOSIS — R402441 Other coma, without documented Glasgow coma scale score, or with partial score reported, in the field [EMT or ambulance]: Secondary | ICD-10-CM | POA: Diagnosis not present

## 2017-03-12 DIAGNOSIS — F17210 Nicotine dependence, cigarettes, uncomplicated: Secondary | ICD-10-CM | POA: Diagnosis not present

## 2017-03-12 DIAGNOSIS — I10 Essential (primary) hypertension: Secondary | ICD-10-CM | POA: Diagnosis not present

## 2017-03-12 DIAGNOSIS — Z7982 Long term (current) use of aspirin: Secondary | ICD-10-CM | POA: Diagnosis not present

## 2017-03-12 DIAGNOSIS — E785 Hyperlipidemia, unspecified: Secondary | ICD-10-CM | POA: Diagnosis not present

## 2017-03-12 DIAGNOSIS — F0391 Unspecified dementia with behavioral disturbance: Secondary | ICD-10-CM | POA: Diagnosis not present

## 2017-03-12 DIAGNOSIS — Z888 Allergy status to other drugs, medicaments and biological substances status: Secondary | ICD-10-CM | POA: Diagnosis not present

## 2017-03-12 DIAGNOSIS — I252 Old myocardial infarction: Secondary | ICD-10-CM | POA: Diagnosis not present

## 2017-03-12 DIAGNOSIS — J449 Chronic obstructive pulmonary disease, unspecified: Secondary | ICD-10-CM | POA: Diagnosis not present

## 2017-03-12 DIAGNOSIS — R410 Disorientation, unspecified: Secondary | ICD-10-CM | POA: Diagnosis not present

## 2017-03-12 DIAGNOSIS — R51 Headache: Secondary | ICD-10-CM | POA: Diagnosis not present

## 2017-03-12 DIAGNOSIS — Z720 Tobacco use: Secondary | ICD-10-CM | POA: Diagnosis not present

## 2017-03-12 DIAGNOSIS — I2510 Atherosclerotic heart disease of native coronary artery without angina pectoris: Secondary | ICD-10-CM | POA: Diagnosis not present

## 2017-03-12 DIAGNOSIS — R439 Unspecified disturbances of smell and taste: Secondary | ICD-10-CM | POA: Diagnosis not present

## 2017-03-12 DIAGNOSIS — R4182 Altered mental status, unspecified: Secondary | ICD-10-CM | POA: Diagnosis not present

## 2017-03-12 DIAGNOSIS — G9340 Encephalopathy, unspecified: Secondary | ICD-10-CM | POA: Diagnosis not present

## 2017-03-12 DIAGNOSIS — Z79899 Other long term (current) drug therapy: Secondary | ICD-10-CM | POA: Diagnosis not present

## 2017-03-12 DIAGNOSIS — R112 Nausea with vomiting, unspecified: Secondary | ICD-10-CM | POA: Diagnosis not present

## 2017-03-12 NOTE — Telephone Encounter (Signed)
**Note De-Identified  Obfuscation** The pt did drop his proof of income paperwork off in our front office. I have faxed his proof of income to [employer]. Awaiting response.

## 2017-03-12 NOTE — Telephone Encounter (Signed)
[name] is  Calling because [name] is having severe reactions to the medications in which he is on . Please call

## 2017-03-12 NOTE — Telephone Encounter (Signed)
Patient's wife calling and states that the patient has been having a bad reaction to his Brilinta. She states that he started taking it about 2 months ago and that he has been having symptoms for the past 3 weeks and that it is getting worse. She states that he can't hardly walk, he can't eat, he can't keep anything down, he has been vomiting, he can't remember anything, and he has had some blood in his stool and urine. She states that the patient is not having any chest pain or any other symptoms. She states that she took him to the ER yesterday and waited 6 hours for him to be seen and left before he was seen. She states that they did an EKG that was normal. His hemoglobin yesterday was 11.7. Wife is asking if he can be seen today and if there is anything else that he can take instead of the Brilinta. In the last OV note it states that they were going to switch him to Plavix if he could not afford the Brilinta. Wife states that he is allergic to Plavix as well and that he had the same symptoms when he took it in the past. Offered and appointment for Thursday with Lyda Jester, PA. Wife is asking if the patient can take something else in the meantime. Discussed with DOD Dr. Tamala Julian and he suggested that the patient take the appointment for Thursday to discuss medication changes. He is not totally convinced that his symptoms are related to the Brilinta, but he stated that at the appointment they could discuss whether switching to Effient would be an option or not. Made the wife aware of DOD's recommendations and advised for the patient to continue taking Brilinta for now and take appointment for Thursday. Advised for the patient to follow up with PCP as well. Wife states that the patient was already seen by PCP last week and she states that they said to follow up with cardiology. Wife states that she does not want to wait until Thursday and that she is just going to call EMS for the patient to be seen in the ER.

## 2017-03-13 DIAGNOSIS — R410 Disorientation, unspecified: Secondary | ICD-10-CM | POA: Diagnosis not present

## 2017-03-13 DIAGNOSIS — Z720 Tobacco use: Secondary | ICD-10-CM | POA: Diagnosis not present

## 2017-03-13 DIAGNOSIS — I2510 Atherosclerotic heart disease of native coronary artery without angina pectoris: Secondary | ICD-10-CM | POA: Diagnosis not present

## 2017-03-13 DIAGNOSIS — R413 Other amnesia: Secondary | ICD-10-CM | POA: Diagnosis not present

## 2017-03-13 DIAGNOSIS — M1612 Unilateral primary osteoarthritis, left hip: Secondary | ICD-10-CM | POA: Diagnosis not present

## 2017-03-13 DIAGNOSIS — E785 Hyperlipidemia, unspecified: Secondary | ICD-10-CM | POA: Diagnosis not present

## 2017-03-13 DIAGNOSIS — R112 Nausea with vomiting, unspecified: Secondary | ICD-10-CM | POA: Diagnosis not present

## 2017-03-13 DIAGNOSIS — G9340 Encephalopathy, unspecified: Secondary | ICD-10-CM | POA: Diagnosis not present

## 2017-03-13 DIAGNOSIS — R42 Dizziness and giddiness: Secondary | ICD-10-CM | POA: Diagnosis not present

## 2017-03-13 DIAGNOSIS — M25552 Pain in left hip: Secondary | ICD-10-CM | POA: Diagnosis not present

## 2017-03-13 DIAGNOSIS — I10 Essential (primary) hypertension: Secondary | ICD-10-CM | POA: Diagnosis not present

## 2017-03-13 DIAGNOSIS — F0391 Unspecified dementia with behavioral disturbance: Secondary | ICD-10-CM | POA: Diagnosis not present

## 2017-03-13 NOTE — Telephone Encounter (Signed)
I don't think that the Brilinta is causing these sx given the time course.  From my last note, I noted that he was staggering in the office.  I am not sure that the memory issues are truly new.  I would check a U/A.  He has to take antiplatelet therapy due to recent atherectomy with stent placement.

## 2017-03-14 DIAGNOSIS — Z720 Tobacco use: Secondary | ICD-10-CM | POA: Diagnosis not present

## 2017-03-14 DIAGNOSIS — I10 Essential (primary) hypertension: Secondary | ICD-10-CM | POA: Diagnosis not present

## 2017-03-14 DIAGNOSIS — M25552 Pain in left hip: Secondary | ICD-10-CM | POA: Diagnosis not present

## 2017-03-14 DIAGNOSIS — F0391 Unspecified dementia with behavioral disturbance: Secondary | ICD-10-CM | POA: Diagnosis not present

## 2017-03-14 DIAGNOSIS — E785 Hyperlipidemia, unspecified: Secondary | ICD-10-CM | POA: Diagnosis not present

## 2017-03-14 DIAGNOSIS — G9340 Encephalopathy, unspecified: Secondary | ICD-10-CM | POA: Diagnosis not present

## 2017-03-14 DIAGNOSIS — R42 Dizziness and giddiness: Secondary | ICD-10-CM | POA: Diagnosis not present

## 2017-03-14 DIAGNOSIS — R112 Nausea with vomiting, unspecified: Secondary | ICD-10-CM | POA: Diagnosis not present

## 2017-03-14 DIAGNOSIS — I2510 Atherosclerotic heart disease of native coronary artery without angina pectoris: Secondary | ICD-10-CM | POA: Diagnosis not present

## 2017-03-14 NOTE — Telephone Encounter (Signed)
Called and spoke to patient. Patient states that he just got discharged from the hospital. Patient states that he went to [facility] and they d/c'd his brilinta and started him on plavix 75 mg QD. Patient states that he is feeling better. Patient has an appointment with Lyda Jester, PA on 8/23 and plans to keep his appointment.

## 2017-03-15 DIAGNOSIS — I2510 Atherosclerotic heart disease of native coronary artery without angina pectoris: Secondary | ICD-10-CM | POA: Diagnosis not present

## 2017-03-15 DIAGNOSIS — M25552 Pain in left hip: Secondary | ICD-10-CM | POA: Diagnosis not present

## 2017-03-15 DIAGNOSIS — R112 Nausea with vomiting, unspecified: Secondary | ICD-10-CM | POA: Diagnosis not present

## 2017-03-15 DIAGNOSIS — R42 Dizziness and giddiness: Secondary | ICD-10-CM | POA: Diagnosis not present

## 2017-03-15 DIAGNOSIS — G9340 Encephalopathy, unspecified: Secondary | ICD-10-CM | POA: Diagnosis not present

## 2017-03-15 DIAGNOSIS — F0391 Unspecified dementia with behavioral disturbance: Secondary | ICD-10-CM | POA: Diagnosis not present

## 2017-03-15 DIAGNOSIS — E785 Hyperlipidemia, unspecified: Secondary | ICD-10-CM | POA: Diagnosis not present

## 2017-03-15 DIAGNOSIS — Z720 Tobacco use: Secondary | ICD-10-CM | POA: Diagnosis not present

## 2017-03-15 DIAGNOSIS — I10 Essential (primary) hypertension: Secondary | ICD-10-CM | POA: Diagnosis not present

## 2017-03-18 DIAGNOSIS — I10 Essential (primary) hypertension: Secondary | ICD-10-CM | POA: Diagnosis not present

## 2017-03-18 DIAGNOSIS — E785 Hyperlipidemia, unspecified: Secondary | ICD-10-CM | POA: Diagnosis not present

## 2017-03-18 DIAGNOSIS — G9340 Encephalopathy, unspecified: Secondary | ICD-10-CM | POA: Diagnosis not present

## 2017-03-18 DIAGNOSIS — R112 Nausea with vomiting, unspecified: Secondary | ICD-10-CM | POA: Diagnosis not present

## 2017-03-18 DIAGNOSIS — I2510 Atherosclerotic heart disease of native coronary artery without angina pectoris: Secondary | ICD-10-CM | POA: Diagnosis not present

## 2017-03-18 DIAGNOSIS — R42 Dizziness and giddiness: Secondary | ICD-10-CM | POA: Diagnosis not present

## 2017-03-19 DIAGNOSIS — E785 Hyperlipidemia, unspecified: Secondary | ICD-10-CM | POA: Diagnosis not present

## 2017-03-19 DIAGNOSIS — R112 Nausea with vomiting, unspecified: Secondary | ICD-10-CM | POA: Diagnosis not present

## 2017-03-19 DIAGNOSIS — I2510 Atherosclerotic heart disease of native coronary artery without angina pectoris: Secondary | ICD-10-CM | POA: Diagnosis not present

## 2017-03-19 DIAGNOSIS — R42 Dizziness and giddiness: Secondary | ICD-10-CM | POA: Diagnosis not present

## 2017-03-19 DIAGNOSIS — I10 Essential (primary) hypertension: Secondary | ICD-10-CM | POA: Diagnosis not present

## 2017-03-19 DIAGNOSIS — G9340 Encephalopathy, unspecified: Secondary | ICD-10-CM | POA: Diagnosis not present

## 2017-03-20 DIAGNOSIS — G9340 Encephalopathy, unspecified: Secondary | ICD-10-CM | POA: Diagnosis not present

## 2017-03-20 DIAGNOSIS — R42 Dizziness and giddiness: Secondary | ICD-10-CM | POA: Diagnosis not present

## 2017-03-20 DIAGNOSIS — I10 Essential (primary) hypertension: Secondary | ICD-10-CM | POA: Diagnosis not present

## 2017-03-20 DIAGNOSIS — R112 Nausea with vomiting, unspecified: Secondary | ICD-10-CM | POA: Diagnosis not present

## 2017-03-20 DIAGNOSIS — I2510 Atherosclerotic heart disease of native coronary artery without angina pectoris: Secondary | ICD-10-CM | POA: Diagnosis not present

## 2017-03-20 DIAGNOSIS — E785 Hyperlipidemia, unspecified: Secondary | ICD-10-CM | POA: Diagnosis not present

## 2017-03-21 DIAGNOSIS — G9340 Encephalopathy, unspecified: Secondary | ICD-10-CM | POA: Diagnosis not present

## 2017-03-21 DIAGNOSIS — I2510 Atherosclerotic heart disease of native coronary artery without angina pectoris: Secondary | ICD-10-CM | POA: Diagnosis not present

## 2017-03-21 DIAGNOSIS — R42 Dizziness and giddiness: Secondary | ICD-10-CM | POA: Diagnosis not present

## 2017-03-21 DIAGNOSIS — I10 Essential (primary) hypertension: Secondary | ICD-10-CM | POA: Diagnosis not present

## 2017-03-21 DIAGNOSIS — Z6830 Body mass index (BMI) 30.0-30.9, adult: Secondary | ICD-10-CM | POA: Diagnosis not present

## 2017-03-21 DIAGNOSIS — R112 Nausea with vomiting, unspecified: Secondary | ICD-10-CM | POA: Diagnosis not present

## 2017-03-21 DIAGNOSIS — L97309 Non-pressure chronic ulcer of unspecified ankle with unspecified severity: Secondary | ICD-10-CM | POA: Diagnosis not present

## 2017-03-21 DIAGNOSIS — E785 Hyperlipidemia, unspecified: Secondary | ICD-10-CM | POA: Diagnosis not present

## 2017-03-21 DIAGNOSIS — G0490 Encephalitis and encephalomyelitis, unspecified: Secondary | ICD-10-CM | POA: Diagnosis not present

## 2017-03-22 DIAGNOSIS — G9340 Encephalopathy, unspecified: Secondary | ICD-10-CM | POA: Diagnosis not present

## 2017-03-22 DIAGNOSIS — R42 Dizziness and giddiness: Secondary | ICD-10-CM | POA: Diagnosis not present

## 2017-03-22 DIAGNOSIS — I2510 Atherosclerotic heart disease of native coronary artery without angina pectoris: Secondary | ICD-10-CM | POA: Diagnosis not present

## 2017-03-22 DIAGNOSIS — E785 Hyperlipidemia, unspecified: Secondary | ICD-10-CM | POA: Diagnosis not present

## 2017-03-22 DIAGNOSIS — R112 Nausea with vomiting, unspecified: Secondary | ICD-10-CM | POA: Diagnosis not present

## 2017-03-22 DIAGNOSIS — I10 Essential (primary) hypertension: Secondary | ICD-10-CM | POA: Diagnosis not present

## 2017-03-25 DIAGNOSIS — R4182 Altered mental status, unspecified: Secondary | ICD-10-CM | POA: Diagnosis not present

## 2017-03-25 DIAGNOSIS — I200 Unstable angina: Secondary | ICD-10-CM | POA: Diagnosis not present

## 2017-03-25 DIAGNOSIS — R069 Unspecified abnormalities of breathing: Secondary | ICD-10-CM | POA: Diagnosis not present

## 2017-03-25 DIAGNOSIS — R42 Dizziness and giddiness: Secondary | ICD-10-CM | POA: Diagnosis not present

## 2017-03-25 DIAGNOSIS — R51 Headache: Secondary | ICD-10-CM | POA: Diagnosis not present

## 2017-03-25 DIAGNOSIS — I700 Atherosclerosis of aorta: Secondary | ICD-10-CM | POA: Diagnosis not present

## 2017-03-25 DIAGNOSIS — R531 Weakness: Secondary | ICD-10-CM | POA: Diagnosis not present

## 2017-03-25 DIAGNOSIS — I2510 Atherosclerotic heart disease of native coronary artery without angina pectoris: Secondary | ICD-10-CM | POA: Diagnosis not present

## 2017-03-25 DIAGNOSIS — R0602 Shortness of breath: Secondary | ICD-10-CM | POA: Diagnosis not present

## 2017-03-25 DIAGNOSIS — R112 Nausea with vomiting, unspecified: Secondary | ICD-10-CM | POA: Diagnosis not present

## 2017-03-25 DIAGNOSIS — R079 Chest pain, unspecified: Secondary | ICD-10-CM | POA: Diagnosis not present

## 2017-03-25 DIAGNOSIS — I10 Essential (primary) hypertension: Secondary | ICD-10-CM | POA: Diagnosis not present

## 2017-03-25 DIAGNOSIS — I639 Cerebral infarction, unspecified: Secondary | ICD-10-CM | POA: Diagnosis not present

## 2017-03-25 DIAGNOSIS — G9340 Encephalopathy, unspecified: Secondary | ICD-10-CM | POA: Diagnosis not present

## 2017-03-25 DIAGNOSIS — E785 Hyperlipidemia, unspecified: Secondary | ICD-10-CM | POA: Diagnosis not present

## 2017-03-26 DIAGNOSIS — R4182 Altered mental status, unspecified: Secondary | ICD-10-CM | POA: Diagnosis not present

## 2017-03-26 DIAGNOSIS — R079 Chest pain, unspecified: Secondary | ICD-10-CM | POA: Diagnosis not present

## 2017-03-26 DIAGNOSIS — R531 Weakness: Secondary | ICD-10-CM | POA: Diagnosis not present

## 2017-03-26 DIAGNOSIS — I6523 Occlusion and stenosis of bilateral carotid arteries: Secondary | ICD-10-CM | POA: Diagnosis not present

## 2017-03-27 DIAGNOSIS — Z9181 History of falling: Secondary | ICD-10-CM | POA: Diagnosis not present

## 2017-03-27 DIAGNOSIS — E785 Hyperlipidemia, unspecified: Secondary | ICD-10-CM | POA: Diagnosis present

## 2017-03-27 DIAGNOSIS — I6503 Occlusion and stenosis of bilateral vertebral arteries: Secondary | ICD-10-CM | POA: Diagnosis not present

## 2017-03-27 DIAGNOSIS — Z741 Need for assistance with personal care: Secondary | ICD-10-CM | POA: Diagnosis not present

## 2017-03-27 DIAGNOSIS — Z7982 Long term (current) use of aspirin: Secondary | ICD-10-CM | POA: Diagnosis not present

## 2017-03-27 DIAGNOSIS — R4182 Altered mental status, unspecified: Secondary | ICD-10-CM | POA: Diagnosis not present

## 2017-03-27 DIAGNOSIS — R531 Weakness: Secondary | ICD-10-CM | POA: Diagnosis present

## 2017-03-27 DIAGNOSIS — M4802 Spinal stenosis, cervical region: Secondary | ICD-10-CM | POA: Diagnosis not present

## 2017-03-27 DIAGNOSIS — Z79899 Other long term (current) drug therapy: Secondary | ICD-10-CM | POA: Diagnosis not present

## 2017-03-27 DIAGNOSIS — R296 Repeated falls: Secondary | ICD-10-CM | POA: Diagnosis not present

## 2017-03-27 DIAGNOSIS — Z658 Other specified problems related to psychosocial circumstances: Secondary | ICD-10-CM | POA: Diagnosis not present

## 2017-03-27 DIAGNOSIS — G3184 Mild cognitive impairment, so stated: Secondary | ICD-10-CM | POA: Diagnosis not present

## 2017-03-27 DIAGNOSIS — M50223 Other cervical disc displacement at C6-C7 level: Secondary | ICD-10-CM | POA: Diagnosis not present

## 2017-03-27 DIAGNOSIS — R260 Ataxic gait: Secondary | ICD-10-CM | POA: Diagnosis not present

## 2017-03-27 DIAGNOSIS — I252 Old myocardial infarction: Secondary | ICD-10-CM | POA: Diagnosis not present

## 2017-03-27 DIAGNOSIS — Z951 Presence of aortocoronary bypass graft: Secondary | ICD-10-CM | POA: Diagnosis not present

## 2017-03-27 DIAGNOSIS — J449 Chronic obstructive pulmonary disease, unspecified: Secondary | ICD-10-CM | POA: Diagnosis not present

## 2017-03-27 DIAGNOSIS — R0789 Other chest pain: Secondary | ICD-10-CM | POA: Diagnosis not present

## 2017-03-27 DIAGNOSIS — R488 Other symbolic dysfunctions: Secondary | ICD-10-CM | POA: Diagnosis not present

## 2017-03-27 DIAGNOSIS — M6281 Muscle weakness (generalized): Secondary | ICD-10-CM | POA: Diagnosis not present

## 2017-03-27 DIAGNOSIS — I69954 Hemiplegia and hemiparesis following unspecified cerebrovascular disease affecting left non-dominant side: Secondary | ICD-10-CM | POA: Diagnosis not present

## 2017-03-27 DIAGNOSIS — R262 Difficulty in walking, not elsewhere classified: Secondary | ICD-10-CM | POA: Diagnosis not present

## 2017-03-27 DIAGNOSIS — Z7401 Bed confinement status: Secondary | ICD-10-CM | POA: Diagnosis not present

## 2017-03-27 DIAGNOSIS — R270 Ataxia, unspecified: Secondary | ICD-10-CM | POA: Diagnosis not present

## 2017-03-27 DIAGNOSIS — F17200 Nicotine dependence, unspecified, uncomplicated: Secondary | ICD-10-CM | POA: Diagnosis present

## 2017-03-27 DIAGNOSIS — R079 Chest pain, unspecified: Secondary | ICD-10-CM | POA: Diagnosis not present

## 2017-03-27 DIAGNOSIS — F418 Other specified anxiety disorders: Secondary | ICD-10-CM | POA: Diagnosis not present

## 2017-03-27 DIAGNOSIS — R42 Dizziness and giddiness: Secondary | ICD-10-CM | POA: Diagnosis not present

## 2017-03-27 DIAGNOSIS — R278 Other lack of coordination: Secondary | ICD-10-CM | POA: Diagnosis not present

## 2017-03-27 DIAGNOSIS — I10 Essential (primary) hypertension: Secondary | ICD-10-CM | POA: Diagnosis present

## 2017-03-27 DIAGNOSIS — I2510 Atherosclerotic heart disease of native coronary artery without angina pectoris: Secondary | ICD-10-CM | POA: Diagnosis not present

## 2017-03-27 DIAGNOSIS — F0390 Unspecified dementia without behavioral disturbance: Secondary | ICD-10-CM | POA: Diagnosis not present

## 2017-03-27 DIAGNOSIS — F05 Delirium due to known physiological condition: Secondary | ICD-10-CM | POA: Diagnosis not present

## 2017-03-27 DIAGNOSIS — I69398 Other sequelae of cerebral infarction: Secondary | ICD-10-CM | POA: Diagnosis not present

## 2017-03-27 DIAGNOSIS — R279 Unspecified lack of coordination: Secondary | ICD-10-CM | POA: Diagnosis not present

## 2017-03-27 DIAGNOSIS — Z8673 Personal history of transient ischemic attack (TIA), and cerebral infarction without residual deficits: Secondary | ICD-10-CM | POA: Diagnosis not present

## 2017-03-27 DIAGNOSIS — I214 Non-ST elevation (NSTEMI) myocardial infarction: Secondary | ICD-10-CM | POA: Diagnosis not present

## 2017-03-28 ENCOUNTER — Ambulatory Visit: Payer: Medicare Other | Admitting: Cardiology

## 2017-03-30 DIAGNOSIS — I69954 Hemiplegia and hemiparesis following unspecified cerebrovascular disease affecting left non-dominant side: Secondary | ICD-10-CM | POA: Diagnosis not present

## 2017-03-30 DIAGNOSIS — R270 Ataxia, unspecified: Secondary | ICD-10-CM | POA: Diagnosis not present

## 2017-03-30 DIAGNOSIS — R531 Weakness: Secondary | ICD-10-CM | POA: Diagnosis not present

## 2017-03-30 DIAGNOSIS — R0602 Shortness of breath: Secondary | ICD-10-CM | POA: Diagnosis not present

## 2017-03-30 DIAGNOSIS — I214 Non-ST elevation (NSTEMI) myocardial infarction: Secondary | ICD-10-CM | POA: Diagnosis not present

## 2017-03-30 DIAGNOSIS — I69398 Other sequelae of cerebral infarction: Secondary | ICD-10-CM | POA: Diagnosis not present

## 2017-03-30 DIAGNOSIS — Z7401 Bed confinement status: Secondary | ICD-10-CM | POA: Diagnosis not present

## 2017-03-30 DIAGNOSIS — R51 Headache: Secondary | ICD-10-CM | POA: Diagnosis not present

## 2017-03-30 DIAGNOSIS — R110 Nausea: Secondary | ICD-10-CM | POA: Diagnosis not present

## 2017-03-30 DIAGNOSIS — R279 Unspecified lack of coordination: Secondary | ICD-10-CM | POA: Diagnosis not present

## 2017-03-30 DIAGNOSIS — E785 Hyperlipidemia, unspecified: Secondary | ICD-10-CM | POA: Diagnosis not present

## 2017-03-30 DIAGNOSIS — F0390 Unspecified dementia without behavioral disturbance: Secondary | ICD-10-CM | POA: Diagnosis not present

## 2017-03-30 DIAGNOSIS — R55 Syncope and collapse: Secondary | ICD-10-CM | POA: Diagnosis not present

## 2017-03-30 DIAGNOSIS — G3184 Mild cognitive impairment, so stated: Secondary | ICD-10-CM | POA: Diagnosis not present

## 2017-03-30 DIAGNOSIS — R262 Difficulty in walking, not elsewhere classified: Secondary | ICD-10-CM | POA: Diagnosis not present

## 2017-03-30 DIAGNOSIS — M6281 Muscle weakness (generalized): Secondary | ICD-10-CM | POA: Diagnosis not present

## 2017-03-30 DIAGNOSIS — R079 Chest pain, unspecified: Secondary | ICD-10-CM | POA: Diagnosis not present

## 2017-03-30 DIAGNOSIS — Z951 Presence of aortocoronary bypass graft: Secondary | ICD-10-CM | POA: Diagnosis not present

## 2017-03-30 DIAGNOSIS — R4182 Altered mental status, unspecified: Secondary | ICD-10-CM | POA: Diagnosis not present

## 2017-03-30 DIAGNOSIS — R42 Dizziness and giddiness: Secondary | ICD-10-CM | POA: Diagnosis not present

## 2017-03-30 DIAGNOSIS — R404 Transient alteration of awareness: Secondary | ICD-10-CM | POA: Diagnosis not present

## 2017-03-30 DIAGNOSIS — R488 Other symbolic dysfunctions: Secondary | ICD-10-CM | POA: Diagnosis not present

## 2017-03-30 DIAGNOSIS — K5900 Constipation, unspecified: Secondary | ICD-10-CM | POA: Diagnosis not present

## 2017-03-30 DIAGNOSIS — R260 Ataxic gait: Secondary | ICD-10-CM | POA: Diagnosis not present

## 2017-03-30 DIAGNOSIS — F418 Other specified anxiety disorders: Secondary | ICD-10-CM | POA: Diagnosis not present

## 2017-03-30 DIAGNOSIS — Z743 Need for continuous supervision: Secondary | ICD-10-CM | POA: Diagnosis not present

## 2017-03-30 DIAGNOSIS — J449 Chronic obstructive pulmonary disease, unspecified: Secondary | ICD-10-CM | POA: Diagnosis not present

## 2017-03-30 DIAGNOSIS — Z741 Need for assistance with personal care: Secondary | ICD-10-CM | POA: Diagnosis not present

## 2017-03-30 DIAGNOSIS — I779 Disorder of arteries and arterioles, unspecified: Secondary | ICD-10-CM | POA: Diagnosis not present

## 2017-03-30 DIAGNOSIS — Z658 Other specified problems related to psychosocial circumstances: Secondary | ICD-10-CM | POA: Diagnosis not present

## 2017-03-30 DIAGNOSIS — R278 Other lack of coordination: Secondary | ICD-10-CM | POA: Diagnosis not present

## 2017-03-30 DIAGNOSIS — I10 Essential (primary) hypertension: Secondary | ICD-10-CM | POA: Diagnosis not present

## 2017-03-30 DIAGNOSIS — R296 Repeated falls: Secondary | ICD-10-CM | POA: Diagnosis not present

## 2017-04-04 DIAGNOSIS — R531 Weakness: Secondary | ICD-10-CM | POA: Diagnosis not present

## 2017-04-04 DIAGNOSIS — R55 Syncope and collapse: Secondary | ICD-10-CM | POA: Diagnosis not present

## 2017-04-05 ENCOUNTER — Other Ambulatory Visit: Payer: Self-pay

## 2017-04-05 DIAGNOSIS — I10 Essential (primary) hypertension: Secondary | ICD-10-CM | POA: Diagnosis not present

## 2017-04-05 DIAGNOSIS — K5900 Constipation, unspecified: Secondary | ICD-10-CM | POA: Diagnosis not present

## 2017-04-05 DIAGNOSIS — I6529 Occlusion and stenosis of unspecified carotid artery: Secondary | ICD-10-CM

## 2017-04-05 DIAGNOSIS — E785 Hyperlipidemia, unspecified: Secondary | ICD-10-CM | POA: Diagnosis not present

## 2017-04-05 DIAGNOSIS — R55 Syncope and collapse: Secondary | ICD-10-CM | POA: Diagnosis not present

## 2017-04-12 DIAGNOSIS — I779 Disorder of arteries and arterioles, unspecified: Secondary | ICD-10-CM | POA: Diagnosis not present

## 2017-04-12 DIAGNOSIS — R42 Dizziness and giddiness: Secondary | ICD-10-CM | POA: Diagnosis not present

## 2017-04-12 DIAGNOSIS — R110 Nausea: Secondary | ICD-10-CM | POA: Diagnosis not present

## 2017-04-12 DIAGNOSIS — R51 Headache: Secondary | ICD-10-CM | POA: Diagnosis not present

## 2017-04-23 DIAGNOSIS — I6503 Occlusion and stenosis of bilateral vertebral arteries: Secondary | ICD-10-CM | POA: Insufficient documentation

## 2017-04-23 DIAGNOSIS — I6523 Occlusion and stenosis of bilateral carotid arteries: Secondary | ICD-10-CM | POA: Insufficient documentation

## 2017-04-23 DIAGNOSIS — R55 Syncope and collapse: Secondary | ICD-10-CM | POA: Diagnosis not present

## 2017-04-23 DIAGNOSIS — E785 Hyperlipidemia, unspecified: Secondary | ICD-10-CM | POA: Diagnosis not present

## 2017-04-23 DIAGNOSIS — Z87891 Personal history of nicotine dependence: Secondary | ICD-10-CM | POA: Diagnosis not present

## 2017-04-24 ENCOUNTER — Encounter: Payer: Self-pay | Admitting: Physician Assistant

## 2017-04-24 NOTE — Progress Notes (Signed)
Cardiology Office Note    Date:  04/25/2017  ID:  [name], DOB 1950/11/24, MRN [id number] PCP:  [name], MD  Cardiologist:  Dr. Irish Lack   Chief Complaint: dizziness  History of Present Illness:  [name] is a 66 y.o. male with history of CAD (s/p CABG x 3 in 2008, PCI as below), prostate CA, GERD, HTN, HLD, moderate MR by cath 12/2016, statin intolerance, tobacco abuse, cerebrovascular disease by imaging 02/2017, syncope 02/2017 who presents for post-hospital follow-up.  To recal history, he has history of CAD s/p CABGx3 in 2008 in [place]. In 10/17, he had a NSTEMI and a DES to the first diagonal in [facility]. He was evaluated by Dr. Irish Lack in 12/2016 and reported symptoms concerning for unstable angina. At that visit his wife also raised concern that he had been "staggering for weeks." A cardiac catheterization showed a patent LIMA-OM and RIMA-D2 with a "kink" along the RIMA which resolved with intracoronary NTG and was suggestive of a coronary vasospasm. He continued to have pain following the procedure, therefore he went back to the cath lab two days later and underwent PCI with DES to 95% stenosed ostial LAD. Given his coronary spasms, nitrates were also recommended but patient reported this was not called into his pharmacy. At f/u visit 01/2017, Imdur was added for recurrent chest pain. He was readmitted 6/25-6/26/18 with chest pain. Troponins were negative. He underwent a lexiscan myoview that showed no evidence of ischemia. D-dimer was elevated, but CTA was negative for PE. Lisinopril was held due to cough. Protonix was added in case symptoms were GI related. He also was treated with steroid taper due to concern for possibe COPD exacerbation. He was then admitted to [facility] 02/2017 for syncope, possibly vasovagal in setting of leg pain. Orthostatics were negative and etiology was unknown. He also had atypical CP during that stay with negative troponins. Outpatient event monitor  was recommended but do not see this arranged. Carotid ultrasound showed mod-large bilateral atherosclerotic plaque compatible with 50-69% luminal narrowing - CT angio neck showed moderate to advanced diffuse disease with severe stenosis vertebral artery bilaterally. On 03/12/17 he called in with multiple symptoms including not being able to hardly walk,eat, can't keep anything down, blood in stool and urine. The patient attributed symptoms to Brilinta but Dr. Hassell Done reply was "I don't think that the Brilinta is causing these sx given the time course.  From my last note, I noted that he was staggering in the office.  I am not sure that the memory issues are truly new.  I would check a U/A.  He has to take antiplatelet therapy due to recent atherectomy with stent placement."   The patient then was admitted to [facility] in August with an episode of recurrent syncope. Although the episode above was reported to occur while standing, this was when he was sitting outside in a chair, felt very nauseated, then was noted to pass out for several seconds. He was very confused afterwards. The wife reports they went [place] was changed to Plavix. I do not have records. He was referred to [facility] whom he saw 9/18 who references that he had an MRI during that admission that did not show any acute CVA. At vascular visit, Dr. Charlane Ferretti stated "We may need to refer him to a neuro interventional radiologist for evaluation of possible intervention of the vertebral arteries. He will need to be followed for his carotid artery stenosis.  In the absence of any acute CVA do not feel like he needs intervention at this time. We will need to get a carotid duplex scan again at least in 6 months for follow-up." They recommended he be referred to neurology and this appointment has been made. He also has appointment with his PCP. PT was coming to the house but wife states their involvement has been limited due  to his unsteadiness. He remains dizzy all the time regardless of level of activity. Meclizine helps but only temporarily. No further CP or SOB. No LEE. Last labs 03/2017 showed Hgb 11.7, glucose 181, CR 1.21. Last LDL 01/2017 was 97.    Past Medical History:  Diagnosis Date  . Cancer [facility])    history of prostate cancer  . COPD (chronic obstructive pulmonary disease) ([place])   . Coronary artery disease    a. 2008 s/p CABG x 3 [facility]): LIMA->OM, RIMA->Diag, VG->RPDA;  b. 05/2016 NSTEMI/PCI: DES ->prox LAD and ost diag;  c. 12/2016 Cath/PCI: LM 95d, LAD 95ost (2.75x8 Promus Premier DES), patent prox stent, D2 30, LCX nl, OM2 95, RCA min irregs, LIMA->OM2 ok, RIMA->D2 85 "kink"-improved w/ IC NTG, VG->RPDA 100.  Marland Kitchen GERD (gastroesophageal reflux disease)   . Hyperlipidemia   . Hypertension   . Mitral regurgitation    a. mod by cath 12/2016.  . Statin intolerance   . Syncope 02/2017  . Tobacco abuse     Past Surgical History:  Procedure Laterality Date  . ABDOMINAL AORTOGRAM N/A 12/26/2016   Procedure: Abdominal Aortogram;  Surgeon: Troy Sine, MD;  Location: [place] [facility];  Service: Cardiovascular;  Laterality: N/A;  . BACK SURGERY  2009  . CORONARY ANGIOGRAPHY N/A 12/28/2016   Procedure: Coronary Angiography;  Surgeon: Burnell Blanks, MD;  Location: [place] [facility];  Service: Cardiovascular;  Laterality: N/A;  . CORONARY ARTERY BYPASS GRAFT  2008  . CORONARY ATHERECTOMY N/A 12/28/2016   Procedure: Coronary Atherectomy;  Surgeon: Burnell Blanks, MD;  Location: [place] [facility];  Service: Cardiovascular;  Laterality: N/A;  . CORONARY STENT INTERVENTION N/A 12/28/2016   Procedure: Coronary Stent Intervention;  Surgeon: Burnell Blanks, MD;  Location: [place] [facility];  Service: Cardiovascular;  Laterality: N/A;  . LEFT HEART CATH Left 05/07/2016   PR CATH PLACE/CORON ANGIO, IMG SUPER/TERP, W  [facility] CATH; SEVICE: CARDIOLOGY  . LEFT HEART CATH AND  CORS/GRAFTS ANGIOGRAPHY N/A 12/26/2016   Procedure: Left Heart Cath and Cors/Grafts Angiography;  Surgeon: Troy Sine, MD;  Location: [place] [facility];  Service: Cardiovascular;  Laterality: N/A;  . LEFT HEART VENTRICULOGRAPHY Left 05/14/2016   PR CATH PLACE/ CORON ANGIO, IMG SUPER/ INTERP, W   . TOTAL HIP ARTHROPLASTY Right 2013    Current Medications: Current Meds  Medication Sig  . aspirin 81 MG tablet Take 1 tablet (81 mg total) by mouth daily.  . clopidogrel (PLAVIX) 75 MG tablet Take 75 mg by mouth daily.  Marland Kitchen ezetimibe (ZETIA) 10 MG tablet Take 1 tablet (10 mg total) by mouth daily.  . hydrochlorothiazide (MICROZIDE) 12.5 MG capsule Take 12.5 mg by mouth daily.  . meclizine (ANTIVERT) 25 MG tablet Take 1 tablet (25 mg total) by mouth 3 (three) times daily as needed for dizziness.  . metoprolol tartrate (LOPRESSOR) 25 MG tablet Take 1 tablet (25 mg total) by mouth 2 (two) times daily.  . nicotine (NICODERM CQ - DOSED IN MG/24 HOURS) 14 mg/24hr patch Place 1 patch (14 mg total)  onto the skin daily.  . nitroGLYCERIN (NITROSTAT) 0.4 MG SL tablet Place 1 tablet (0.4 mg total) under the tongue as needed for chest pain. X 3 doses  . pantoprazole (PROTONIX) 20 MG tablet Take 1 tablet (20 mg total) by mouth daily.  . [DISCONTINUED] traMADol (ULTRAM) 50 MG tablet Take 2 tablets (100 mg total) by mouth every 8 (eight) hours as needed.     Allergies:   Codeine; Hydrocodone-acetaminophen; Simvastatin; and Influenza vaccines   Social History   Social History  . Marital status: Married    Spouse name: N/A  . Number of children: N/A  . Years of education: N/A   Social History Main Topics  . Smoking status: Current Some Day Smoker    Packs/day: 0.50    Years: 56.00    Last attempt to quit: 05/11/2016  . Smokeless tobacco: Never Used     Comment: Refused  . Alcohol use No  . Drug use: Yes  . Sexual activity: Yes    Birth control/ protection: Other-see comments   Other Topics  Concern  . None   Social History Narrative   Lives at home with family. Works as a Administrator. Independent otherwise     Family History:  Family History  Problem Relation Age of Onset  . Kidney failure Mother   . Hypertension Sister   . Hypertension Brother   . Hypertension Brother     ROS:   Please see the history of present illness.  All other systems are reviewed and otherwise negative.    PHYSICAL EXAM:   VS:  BP 138/80   Pulse 68   Ht 5\' 11"  (1.803 m)   Wt 221 lb 12.8 oz (100.6 kg)   SpO2 98%   BMI 30.93 kg/m   BMI: Body mass index is 30.93 kg/m. GEN: Well nourished, well developed WM, ruddy complexion, in no acute distress  HEENT: normocephalic, atraumatic Neck: no JVD, carotid bruits, or masses Cardiac: RRR; no murmurs, rubs, or gallops, no edema  Respiratory:  clear to auscultation bilaterally, normal work of breathing GI: soft, nontender, nondistended, + BS MS: no deformity or atrophy  Skin: warm and dry, no rash Neuro:  Alert and Oriented x 3, Strength and sensation are intact, follows commands, very unsteady when even leaning forward Psych: euthymic mood, flattened but responsive affect  Wt Readings from Last 3 Encounters:  04/25/17 221 lb 12.8 oz (100.6 kg)  03/11/17 215 lb (97.5 kg)  02/12/17 210 lb (95.3 kg)      Studies/Labs Reviewed:   EKG:  EKG was ordered today and personally reviewed by me and demonstrates NSR 68bpm no acute changes.  Recent Labs: 12/26/2016: ALT 16 02/12/2017: Magnesium 2.2 03/11/2017: BUN 10; Creatinine, Ser 1.21; Hemoglobin 11.7; Platelets 336; Potassium 4.5; Sodium 139   Lipid Panel    Component Value Date/Time   CHOL 151 01/29/2017 0426   TRIG 83 01/29/2017 0426   HDL 37 (L) 01/29/2017 0426   CHOLHDL 4.1 01/29/2017 0426   VLDL 17 01/29/2017 0426   LDLCALC 97 01/29/2017 0426    Additional studies/ records that were reviewed today include: Summarized above    ASSESSMENT & PLAN:   1. Dizziness - etiology  unclear, but not clearly cardiac-related. I question if this is due to cerebrovascular disease and vertebral artery disease. Seen by [facility] who is considering referral to neurointerventional team per their note. Patient's wife states these symptoms have been present only since he had cath in 12/2016 HOWEVER Dr.  Varanasi's note prior to cath states the wife had reported he had been staggering for several months before then. He is in NSR, normotensive with normal O2 sats at today's OV even while actively dizzy so I doubt there is an acute cardiac issue causing this. He was started by HCTZ by someone at [facility] since the last time seen in this office. He has not noticed any recent increase in dizziness, but his wife denies repeat labwork since that time. Will recheck BMET given borderline Cr in the past. 2. Syncope - first episode was atypical, felt possibly vasovagal by [facility], but second episode more concerning as it happened while seated but does sound like he had possible post-ictal confusion. He has upcoming appointment with neurology. For completeness will obtain 2D echo and 30 day event monitor. Check thyroid, repeat CBC. No driving for now. 3. CAD - no interim anginal events. EKG reassuring. Continue ASA, BB, Plavix, Zetia. 4. Mitral regurgitation - moderate by prior cath, will evaluate by echo. 5. HTN - controlled, upper-normal. Would not aggressively reduce BP further at this time due this dizziness. 6. Hyperlipidemia - continue Zetia. Would be hesitant to introduce any new medications due to unclear etiology of #1, so as not to complicate the picture.  Disposition: F/u with Dr. Irish Lack in 2 months.    Medication Adjustments/Labs and Tests Ordered: Current medicines are reviewed at length with the patient today.  Concerns regarding medicines are outlined above. Medication changes, Labs and Tests ordered today are summarized above and listed in the Patient Instructions  accessible in Encounters.   Signed, Charlie Pitter, PA-C  04/25/2017 9:08 AM    [address], [place], [place]  [address] Phone: [phone]; Fax: [phone]

## 2017-04-25 ENCOUNTER — Ambulatory Visit (HOSPITAL_COMMUNITY): Payer: Medicare Other | Attending: Physician Assistant

## 2017-04-25 ENCOUNTER — Encounter: Payer: Self-pay | Admitting: Physician Assistant

## 2017-04-25 ENCOUNTER — Other Ambulatory Visit: Payer: Self-pay

## 2017-04-25 ENCOUNTER — Ambulatory Visit (INDEPENDENT_AMBULATORY_CARE_PROVIDER_SITE_OTHER): Payer: Medicare Other

## 2017-04-25 ENCOUNTER — Encounter (INDEPENDENT_AMBULATORY_CARE_PROVIDER_SITE_OTHER): Payer: Self-pay

## 2017-04-25 ENCOUNTER — Ambulatory Visit (INDEPENDENT_AMBULATORY_CARE_PROVIDER_SITE_OTHER): Payer: Medicare Other | Admitting: Physician Assistant

## 2017-04-25 ENCOUNTER — Other Ambulatory Visit: Payer: Self-pay | Admitting: Physician Assistant

## 2017-04-25 VITALS — BP 138/80 | HR 68 | Ht 71.0 in | Wt 221.8 lb

## 2017-04-25 DIAGNOSIS — I10 Essential (primary) hypertension: Secondary | ICD-10-CM

## 2017-04-25 DIAGNOSIS — I2510 Atherosclerotic heart disease of native coronary artery without angina pectoris: Secondary | ICD-10-CM

## 2017-04-25 DIAGNOSIS — Z87898 Personal history of other specified conditions: Secondary | ICD-10-CM | POA: Diagnosis not present

## 2017-04-25 DIAGNOSIS — I200 Unstable angina: Secondary | ICD-10-CM

## 2017-04-25 DIAGNOSIS — R42 Dizziness and giddiness: Secondary | ICD-10-CM | POA: Diagnosis not present

## 2017-04-25 DIAGNOSIS — R55 Syncope and collapse: Secondary | ICD-10-CM | POA: Diagnosis not present

## 2017-04-25 DIAGNOSIS — I340 Nonrheumatic mitral (valve) insufficiency: Secondary | ICD-10-CM | POA: Insufficient documentation

## 2017-04-25 DIAGNOSIS — E785 Hyperlipidemia, unspecified: Secondary | ICD-10-CM

## 2017-04-25 LAB — BASIC METABOLIC PANEL
BUN/Creatinine Ratio: 9 — ABNORMAL LOW (ref 10–24)
BUN: 10 mg/dL (ref 8–27)
CALCIUM: 8.9 mg/dL (ref 8.6–10.2)
CO2: 22 mmol/L (ref 20–29)
CREATININE: 1.1 mg/dL (ref 0.76–1.27)
Chloride: 101 mmol/L (ref 96–106)
GFR calc Af Amer: 80 mL/min/{1.73_m2} (ref >59)
GFR calc non Af Amer: 69 mL/min/{1.73_m2} (ref >59)
GLUCOSE: 95 mg/dL (ref 65–99)
POTASSIUM: 4.5 mmol/L (ref 3.5–5.2)
SODIUM: 137 mmol/L (ref 134–144)

## 2017-04-25 LAB — ECHOCARDIOGRAM COMPLETE
HEIGHTINCHES: 71 in
Weight: 3548.8 oz

## 2017-04-25 LAB — CBC
HEMOGLOBIN: 11.8 g/dL — AB (ref 13.0–17.7)
Hematocrit: 36.3 % — ABNORMAL LOW (ref 37.5–51.0)
MCH: 27.4 pg (ref 26.6–33.0)
MCHC: 32.5 g/dL (ref 31.5–35.7)
MCV: 84 fL (ref 79–97)
PLATELETS: 342 10*3/uL (ref 150–379)
RBC: 4.31 x10E6/uL (ref 4.14–5.80)
RDW: 13.7 % (ref 12.3–15.4)
WBC: 7.9 10*3/uL (ref 3.4–10.8)

## 2017-04-25 LAB — TSH: TSH: 1.84 u[IU]/mL (ref 0.450–4.500)

## 2017-04-25 NOTE — Patient Instructions (Addendum)
Medication Instructions:  Your physician recommends that you continue on your current medications as directed. Please refer to the Current Medication list given to you today.   Labwork: TODAY:  CBC, BMET, & TSH  Testing/Procedures: Your physician has recommended that you wear an event monitor STAT.  Event monitors are medical devices that record the heart's electrical activity. Doctors most often [place] these monitors to diagnose arrhythmias. Arrhythmias are problems with the speed or rhythm of the heartbeat. The monitor is a small, portable device. You can wear one while you do your normal daily activities. This is usually used to diagnose what is causing palpitations/syncope (passing out).  Your physician has requested that you have an echocardiogram. Echocardiography is a painless test that uses sound waves to create images of your heart. It provides your doctor with information about the size and shape of your heart and how well your heart's chambers and valves are working. This procedure takes approximately one hour. There are no restrictions for this procedure.   Follow-Up: Your physician recommends that you schedule a follow-up appointment in: 2 MONTHS WITH DR. VARANASI, OR HIS CARETEAM APP ON A DAY HE IS IN THE OFFICE   Any Other Special Instructions Will Be Listed Below (If Applicable).  Cardiac Event Monitoring A cardiac event monitor is a small recording device that is used to detect abnormal heart rhythms (arrhythmias). The monitor is used to record your heart rhythm when you have symptoms, such as:  Fast heartbeats (palpitations), such as heart racing or fluttering.  Dizziness.  Fainting or light-headedness.  Unexplained weakness.  Some monitors are wired to electrodes placed on your chest. Electrodes are flat, sticky disks that attach to your skin. Other monitors may be hand-held or worn on the wrist. The monitor can be worn for up to 30 days. If the monitor is attached to  your chest, a technician will prepare your chest for the electrode placement and show you how to work the monitor. Take time to practice using the monitor before you leave the office. Make sure you understand how to send the information from the monitor to your health care provider. In some cases, you may need to use a landline telephone instead of a cell phone. What are the risks? Generally, this device is safe to use, but it possible that the skin under the electrodes will become irritated. How to use your cardiac event monitor  Wear your monitor at all times, except when you are in water: ? Do not let the monitor get wet. ? Take the monitor off when you bathe. Do not swim or use a hot tub with it on.  Keep your skin clean. Do not put body lotion or moisturizer on your chest.  Change the electrodes as told by your health care provider or any time they stop sticking to your skin. You may need to use medical tape to keep them on.  Try to put the electrodes in slightly different places on your chest to help prevent skin irritation. They must remain in the area under your left breast and in the upper right section of your chest.  Make sure the monitor is safely clipped to your clothing or in a location close to your body that your health care provider recommends.  Press the button to record as soon as you feel heart-related symptoms, such as: ? Dizziness. ? Weakness. ? Light-headedness. ? Palpitations. ? Thumping or pounding in your chest. ? Shortness of breath. ? Unexplained weakness.  Keep  a diary of your activities, such as walking, doing chores, and taking medicine. It is very important to note what you were doing when you pushed the button to record your symptoms. This will help your health care provider determine what might be contributing to your symptoms.  Send the recorded information as recommended by your health care provider. It may take some time for your health care provider  to process the results.  Change the batteries as told by your health care provider.  Keep electronic devices away from your monitor. This includes: ? Tablets. ? MP3 players. ? Cell phones.  While wearing your monitor you should avoid: ? Electric blankets. ? Armed forces operational officer. ? Electric toothbrushes. ? Microwave ovens. ? Magnets. ? Metal detectors. Get help right away if:  You have chest pain.  You have extreme difficulty breathing or shortness of breath.  You develop a very fast heartbeat that persists.  You develop dizziness that does not go away.  You faint or constantly feel like you are about to faint. Summary  A cardiac event monitor is a small recording device that is used to help detect abnormal heart rhythms (arrhythmias).  The monitor is used to record your heart rhythm when you have heart-related symptoms.  Make sure you understand how to send the information from the monitor to your health care provider.  It is important to press the button on the monitor when you have any heart-related symptoms.  Keep a diary of your activities, such as walking, doing chores, and taking medicine. It is very important to note what you were doing when you pushed the button to record your symptoms. This will help your health care provider learn what might be causing your symptoms. This information is not intended to replace advice given to you by your health care provider. Make sure you discuss any questions you have with your health care provider. Document Released: 05/01/2008 Document Revised: 07/07/2016 Document Reviewed: 07/07/2016 Elsevier Interactive Patient Education  2017 [employer].    Echocardiogram An echocardiogram, or echocardiography, uses sound waves (ultrasound) to produce an image of your heart. The echocardiogram is simple, painless, obtained within a short period of time, and offers valuable information to your health care provider. The images from an  echocardiogram can provide information such as:  Evidence of coronary artery disease (CAD).  Heart size.  Heart muscle function.  Heart valve function.  Aneurysm detection.  Evidence of a past heart attack.  Fluid buildup around the heart.  Heart muscle thickening.  Assess heart valve function.  Tell a health care provider about:  Any allergies you have.  All medicines you are taking, including vitamins, herbs, eye drops, creams, and over-the-counter medicines.  Any problems you or family members have had with anesthetic medicines.  Any blood disorders you have.  Any surgeries you have had.  Any medical conditions you have.  Whether you are pregnant or may be pregnant. What happens before the procedure? No special preparation is needed. Eat and drink normally. What happens during the procedure?  In order to produce an image of your heart, gel will be applied to your chest and a wand-like tool (transducer) will be moved over your chest. The gel will help transmit the sound waves from the transducer. The sound waves will harmlessly bounce off your heart to allow the heart images to be captured in real-time motion. These images will then be recorded.  You may need an IV to receive a medicine that improves the  quality of the pictures. What happens after the procedure? You may return to your normal schedule including diet, activities, and medicines, unless your health care provider tells you otherwise. This information is not intended to replace advice given to you by your health care provider. Make sure you discuss any questions you have with your health care provider. Document Released: 07/20/2000 Document Revised: 03/10/2016 Document Reviewed: 03/30/2013 Elsevier Interactive Patient Education  2017 [employer].     If you need a refill on your cardiac medications before your next appointment, please call your pharmacy.

## 2017-04-30 DIAGNOSIS — J449 Chronic obstructive pulmonary disease, unspecified: Secondary | ICD-10-CM | POA: Diagnosis not present

## 2017-04-30 DIAGNOSIS — I252 Old myocardial infarction: Secondary | ICD-10-CM | POA: Diagnosis not present

## 2017-04-30 DIAGNOSIS — I69354 Hemiplegia and hemiparesis following cerebral infarction affecting left non-dominant side: Secondary | ICD-10-CM | POA: Diagnosis not present

## 2017-04-30 DIAGNOSIS — Z6831 Body mass index (BMI) 31.0-31.9, adult: Secondary | ICD-10-CM | POA: Diagnosis not present

## 2017-04-30 DIAGNOSIS — I2510 Atherosclerotic heart disease of native coronary artery without angina pectoris: Secondary | ICD-10-CM | POA: Diagnosis not present

## 2017-04-30 DIAGNOSIS — R51 Headache: Secondary | ICD-10-CM | POA: Diagnosis not present

## 2017-04-30 DIAGNOSIS — I10 Essential (primary) hypertension: Secondary | ICD-10-CM | POA: Diagnosis not present

## 2017-05-20 ENCOUNTER — Ambulatory Visit (INDEPENDENT_AMBULATORY_CARE_PROVIDER_SITE_OTHER): Payer: Medicare Other | Admitting: Diagnostic Neuroimaging

## 2017-05-20 ENCOUNTER — Encounter: Payer: Self-pay | Admitting: Diagnostic Neuroimaging

## 2017-05-20 VITALS — BP 147/85 | HR 67 | Ht 71.0 in | Wt 225.2 lb

## 2017-05-20 DIAGNOSIS — I200 Unstable angina: Secondary | ICD-10-CM | POA: Diagnosis not present

## 2017-05-20 DIAGNOSIS — G44229 Chronic tension-type headache, not intractable: Secondary | ICD-10-CM | POA: Diagnosis not present

## 2017-05-20 DIAGNOSIS — I6503 Occlusion and stenosis of bilateral vertebral arteries: Secondary | ICD-10-CM

## 2017-05-20 DIAGNOSIS — R42 Dizziness and giddiness: Secondary | ICD-10-CM

## 2017-05-20 DIAGNOSIS — R55 Syncope and collapse: Secondary | ICD-10-CM | POA: Diagnosis not present

## 2017-05-20 NOTE — Progress Notes (Signed)
[facility]  PATIENT: [name] DOB: February 05, 1951  REFERRING CLINICIAN: Aris Everts HISTORY FROM: patient, wife, chart review REASON FOR VISIT: new consult    HISTORICAL  CHIEF COMPLAINT:  Chief Complaint  Patient presents with  . Encephalitis, encephalomyelitis    rm 6, New Pt, "passed out at work last June, again in Aug; was in hospital then rehab x 21 days, has been home since mid to late Sept; feel off balance when walking     HISTORY OF PRESENT ILLNESS:   66 year old male here for evaluation of dizziness, syncope, vertebral artery stenosis, headaches.  Patient has history of hypertension, hyperlipidemia, tobacco abuse, prostate cancer, coronary artery disease status post CABG and PCI.   Patient and wife are concerned about recurrent dizziness and lightheadedness, especially with standing up. Patient has sensation of lightheadedness, sometimes associated with slurred speech and double vision. No vertigo. Sometimes patient has passed out. Patient was admitted to the hospital in August 2018 for syncope. Patient was found to have bilateral carotid artery stenosis and bilateral vertebral artery origin stenosis. He was evaluated by vascular surgery clinic who recommended medical management.  Review of hospital notes indicates history of "dementia with delirium". Patient reports some mild memory loss, but not that significant. Wife also has noticed some memory loss issues. She takes care of his medications. She says she does this because patient would not normally take medicines on his own. 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 He would like to go back to work but was told not to drive or return to work by his other physicians due to his recent medical issues. Patient and wife are not sure about the diagnosis of dementia.  Patient also having intermittent headaches. He describes a squeezing sensation in his forehead. No nausea  or vomiting. No sensitivity light or sound.  Patient also has significant insomnia. He tends to stay awake late and then wake up late in the morning.    REVIEW OF SYSTEMS: Full 14 system review of systems performed and negative with exception of: Fatigue ringing in ears spinning sensation wheezing shortness of breath blurred vision memory loss insomnia restless legs headache difficulty swallowing dizziness passing out too much sleep decreased energy change in appetite.  ALLERGIES: Allergies  Allergen Reactions  . Ciprofloxacin Hcl Nausea Only    Wife reported  . Codeine Other (See Comments)    Other reaction(s): GI Upset (intolerance) Pt not sure  . Hydrocodone-Acetaminophen Other (See Comments)    Other reaction(s): GI Upset (intolerance)  . Lisinopril     Wife reported  . Meclizine     Wife reported  . Metronidazole Nausea Only    Wife reported  . Pantoprazole     Wife reported  . Ranitidine     Wife reported  . Simvastatin Other (See Comments)    Other reaction(s): GI Upset (intolerance)  . Influenza Vaccines Nausea And Vomiting    HOME MEDICATIONS: Outpatient Medications Prior to Visit  Medication Sig Dispense Refill  . aspirin 81 MG tablet Take 1 tablet (81 mg total) by mouth daily.    . clopidogrel (PLAVIX) 75 MG tablet Take 75 mg by mouth daily.    Marland Kitchen ezetimibe (ZETIA) 10 MG tablet Take 1 tablet (10 mg total) by mouth daily. 30 tablet 6  . isosorbide mononitrate (IMDUR) 30 MG 24 hr tablet Take 1 tablet (30 mg total) by mouth daily. 90 tablet 3  . meclizine (ANTIVERT) 25  MG tablet Take 1 tablet (25 mg total) by mouth 3 (three) times daily as needed for dizziness. 30 tablet 0  . metoprolol tartrate (LOPRESSOR) 25 MG tablet Take 1 tablet (25 mg total) by mouth 2 (two) times daily. 90 tablet 3  . nicotine (NICODERM CQ - DOSED IN MG/24 HOURS) 14 mg/24hr patch Place 1 patch (14 mg total) onto the skin daily. 30 patch 0  . nitroGLYCERIN (NITROSTAT) 0.4 MG SL tablet Place 1  tablet (0.4 mg total) under the tongue as needed for chest pain. X 3 doses 25 tablet 3  . hydrochlorothiazide (MICROZIDE) 12.5 MG capsule Take 12.5 mg by mouth daily.    . pantoprazole (PROTONIX) 20 MG tablet Take 1 tablet (20 mg total) by mouth daily. 30 tablet 1   No facility-administered medications prior to visit.     PAST MEDICAL HISTORY: Past Medical History:  Diagnosis Date  . Cancer [facility])    history of prostate cancer- cryoablation  . Carotid artery disease ([place])   . Cerebrovascular disease   . COPD (chronic obstructive pulmonary disease) ([place])   . Coronary artery disease    a. 2008 s/p CABG x 3 [facility]): LIMA->OM, RIMA->Diag, VG->RPDA;  b. 05/2016 NSTEMI/PCI: DES ->prox LAD and ost diag;  c. 12/2016 Cath/PCI: LM 95d, LAD 95ost (2.75x8 Promus Premier DES), patent prox stent, D2 30, LCX nl, OM2 95, RCA min irregs, LIMA->OM2 ok, RIMA->D2 85 "kink"-improved w/ IC NTG, VG->RPDA 100.  Marland Kitchen GERD (gastroesophageal reflux disease)   . Hyperlipidemia   . Hypertension   . Mitral regurgitation    a. mod by cath 12/2016.  . Statin intolerance   . Syncope 02/2017  . Tobacco abuse     PAST SURGICAL HISTORY: Past Surgical History:  Procedure Laterality Date  . ABDOMINAL AORTOGRAM N/A 12/26/2016   Procedure: Abdominal Aortogram;  Surgeon: Troy Sine, MD;  Location: [place] [facility];  Service: Cardiovascular;  Laterality: N/A;  . BACK SURGERY  2009  . CORONARY ANGIOGRAPHY N/A 12/28/2016   Procedure: Coronary Angiography;  Surgeon: Burnell Blanks, MD;  Location: [place] [facility];  Service: Cardiovascular;  Laterality: N/A;  . CORONARY ARTERY BYPASS GRAFT  2008  . CORONARY ATHERECTOMY N/A 12/28/2016   Procedure: Coronary Atherectomy;  Surgeon: Burnell Blanks, MD;  Location: [place] [facility];  Service: Cardiovascular;  Laterality: N/A;  . CORONARY STENT INTERVENTION N/A 12/28/2016   Procedure: Coronary Stent Intervention;  Surgeon: Burnell Blanks, MD;   Location: [place] [facility];  Service: Cardiovascular;  Laterality: N/A;  . LEFT HEART CATH Left 05/07/2016   PR CATH PLACE/CORON ANGIO, IMG SUPER/TERP, W  [facility] CATH; SEVICE: CARDIOLOGY  . LEFT HEART CATH AND CORS/GRAFTS ANGIOGRAPHY N/A 12/26/2016   Procedure: Left Heart Cath and Cors/Grafts Angiography;  Surgeon: Troy Sine, MD;  Location: [place] [facility];  Service: Cardiovascular;  Laterality: N/A;  . LEFT HEART VENTRICULOGRAPHY Left 05/14/2016   PR CATH PLACE/ CORON ANGIO, IMG SUPER/ INTERP, W   . TOTAL HIP ARTHROPLASTY Right 2013    FAMILY HISTORY: Family History  Problem Relation Age of Onset  . Kidney failure Mother   . Hypertension Sister   . Hypertension Brother   . Hypertension Brother     SOCIAL HISTORY:  Social History   Social History  . Marital status: Married    Spouse name: Katharine Look  . Number of children: 3  . Years of education: 9   Occupational History  .  truck driver, retired   Social History Main Topics  . Smoking status: Current Every Day Smoker    Packs/day: 0.50    Years: 56.00    Last attempt to quit: 05/11/2016  . Smokeless tobacco: Never Used     Comment: Refused  . Alcohol use No  . Drug use: Yes  . Sexual activity: Yes    Birth control/ protection: Other-see comments   Other Topics Concern  . Not on file   Social History Narrative   Lives at home with family. Works as a Administrator. Independent otherwise   Caffeine- 2 cups daily     PHYSICAL EXAM  GENERAL EXAM/CONSTITUTIONAL: Vitals:  Vitals:   05/20/17 0833  BP: (!) 147/85  Pulse: 67  Weight: 225 lb 3.2 oz (102.2 kg)  Height: 5\' 11"  (1.803 m)     Body mass index is 31.41 kg/m.  Visual Acuity Screening   Right eye Left eye Both eyes  Without correction: 20/40 20/50   With correction:        Patient is in no distress; well developed, nourished and groomed; neck is supple  CARDIOVASCULAR:  Examination of carotid arteries is normal; no carotid  bruits  Regular rate and rhythm, no murmurs  Examination of peripheral vascular system by observation and palpation is normal  EYES:  Ophthalmoscopic exam of optic discs and posterior segments is normal; no papilledema or hemorrhages  MUSCULOSKELETAL:  Gait, strength, tone, movements noted in Neurologic exam below  NEUROLOGIC: MENTAL STATUS:  No flowsheet data found.  awake, alert, oriented to person, place and time  recent and remote memory intact  normal attention and concentration  language fluent, comprehension intact, naming intact,   fund of knowledge appropriate  CRANIAL NERVE:   2nd - no papilledema on fundoscopic exam  2nd, 3rd, 4th, 6th - pupils equal and reactive to light, visual fields full to confrontation, extraocular muscles intact, no nystagmus  5th - facial sensation symmetric  7th - facial strength symmetric  8th - hearing intact  9th - palate elevates symmetrically, uvula midline  11th - shoulder shrug symmetric  12th - tongue protrusion midline  HOARSE VOICE  MOTOR:   normal bulk and tone, full strength in the BUE, BLE; EXCEPT BILATERAL BILATERAL DELTOID WEAKNESS (LIMITED BY PAIN)  SENSORY:   normal and symmetric to light touch, temperature, vibration  COORDINATION:   finger-nose-finger, fine finger movements normal  REFLEXES:   deep tendon reflexes TRACE and symmetric  GAIT/STATION:   UNSTEADY GAIT; STAGGERING; ATAXIC GAIT; USING SINGLE POINT CANE    DIAGNOSTIC DATA (LABS, IMAGING, TESTING) - I reviewed patient records, labs, notes, testing and imaging myself where available.  Lab Results  Component Value Date   WBC 7.9 04/25/2017   HGB 11.8 (L) 04/25/2017   HCT 36.3 (L) 04/25/2017   MCV 84 04/25/2017   PLT 342 04/25/2017      Component Value Date/Time   NA 137 04/25/2017 1015   K 4.5 04/25/2017 1015   CL 101 04/25/2017 1015   CO2 22 04/25/2017 1015   GLUCOSE 95 04/25/2017 1015   GLUCOSE 181 (H) 03/11/2017  1639   BUN 10 04/25/2017 1015   CREATININE 1.10 04/25/2017 1015   CALCIUM 8.9 04/25/2017 1015   PROT 6.9 12/26/2016 1118   ALBUMIN 3.7 12/26/2016 1118   AST 15 12/26/2016 1118   ALT 16 (L) 12/26/2016 1118   ALKPHOS 92 12/26/2016 1118   BILITOT 0.4 12/26/2016 1118   GFRNONAA 69 04/25/2017 1015  GFRAA 80 04/25/2017 1015   Lab Results  Component Value Date   CHOL 151 01/29/2017   HDL 37 (L) 01/29/2017   LDLCALC 97 01/29/2017   TRIG 83 01/29/2017   CHOLHDL 4.1 01/29/2017   Lab Results  Component Value Date   HGBA1C 5.8 (H) 12/27/2016   Lab Results  Component Value Date   VITAMINB12 388 04/20/2008   Lab Results  Component Value Date   TSH 1.840 04/25/2017     02/14/17 CTA head / neck [I reviewed images myself and agree with interpretation. There is severe vertebral artery ORIGIN stenosis; but patent vertebral arteries -VRP]  - Moderate to advanced diffuse atherosclerotic disease - 25% diameter stenosis distal right common carotid artery and 40% stenosis of the proximal right internal carotid artery - 60% diameter stenosis origin of the left common carotid artery and 50% diameter stenosis distal common carotid artery. Mild plaque in the left carotid bulb  - Severe stenosis vertebral artery bilaterally.  02/13/17 carotid u/s - Moderate to large amount of bilateral atherosclerotic plaque, right greater than left, results in elevated peak systolic velocities within the bilateral internal carotid arteries compatible with the 50-69% luminal narrowing range bilaterally. Further evaluation with CTA could performed as clinically indicated.  03/13/17 MRI brain / MRA head 1. No acute intracranial abnormality. Stable and largely unremarkable for age MRI appearance of the brain. 2. Negative intracranial MRA.  03/27/17 MRI brain [I reviewed images myself and agree with interpretation. -VRP]  1. No acute intracranial abnormality. 2. Chronic left cerebellar infarct.  03/27/17 CTA head /  neck 1. Patent carotid and vertebral arteries. No dissection, aneurysm, or high-grade stenosis utilizing NASCET criteria. 2. Patent circle of Willis. No large vessel occlusion, aneurysm, or significant stenosis. 3. Predominantly fibrofatty plaque of the right carotid system of the neck with areas of mild less than 50% common carotid artery proximal ICA stenosis. 4. Predominantly fibrofatty plaque of left carotid system of the neck with segments of mild-to-moderate stenosis greatest at the common carotid origin where stenosis is 60%. - On review, the there is Chronic Severe Bilateral Vertebral Artery Origin Stenosis due to soft atherosclerotic plaque, as seen on the neck CTA 02/14/2017. The vertebral artery origin detail is suboptimal today related to streak artifact from dense right subclavian venous contrast bolus.   04/25/17 TTE - Normal LV size with mild LV hypertrophy. EF 45-50%, diffuse   hypokinesis. Normal RV size with mildly decreased systolic   function. Mild mitral regurgitation.     ASSESSMENT AND PLAN  66 y.o. year old male here with dizziness, syncope, vertebral artery stenosis, headaches.  Patient has history of hypertension, hyperlipidemia, tobacco abuse, prostate cancer, coronary artery disease status post CABG and PCI.    Dx:  1. Dizziness   2. Syncope, unspecified syncope type   3. Chronic tension-type headache, not intractable      PLAN:  DIZZINESS / BILATERAL VERTEBRAL ARTERY ORIGIN STENOSIS (? Vertebrobasilar insufficiency) - medical management of risk factors (HTN, smoking cessation) - continue aspirin and plavix - use wheelchair or walker for now - will refer to Dr. Estanislado Pandy for cerebral angiogram to better evaluate vertebral arteries and posterior circulation and consider vertebral artery origin stenting  TENSION HEADACHES - holistic treatment of headaches (nutrition, fitness, relaxation techniques) - may use tylenol as needed for breakthrough  headaches  MEMORY LOSS - return for MMSE and braincheck testing evaluation  INSOMNIA - sleep hygiene reviewed - may try OTC melatonin  Orders Placed This Encounter  Procedures  .  CEREBRAL ANGIOGRAM   Return in about 3 months (around 08/20/2017).  I reviewed images, labs, notes, records myself. I summarized findings and reviewed with patient, for this high risk condition (syncope / VBI) requiring high complexity decision making.    Penni Bombard, MD 42/05/3127, 1:18 AM Certified in Neurology, Neurophysiology and Neuroimaging  [facility] [address] [place], [place] [address] [phone]

## 2017-05-20 NOTE — Patient Instructions (Addendum)
Thank you for coming to see [place] at [facility]. I hope we have been able to provide you high quality care today.  You may receive a patient satisfaction survey over the next few weeks. We would appreciate your feedback and comments so that we may continue to improve ourselves and the health of our patients.  DIZZINESS / BILATERAL VERTEBRAL ARTERY ORIGIN STENOSIS (? Vertebrobasilar insufficiency) - medical management of risk factors (HTN, smoking cessation) - continue aspirin and plavix - use wheelchair or walker for now - will refer to Dr. Estanislado Pandy for cerebral angiogram to better evaluate vertebral arteries and posterior circulation and consider vertebral artery origin stenting  TENSION HEADACHES - holistic treatment of headaches (nutrition, fitness, relaxation techniques) - may use tylenol as needed for breakthrough  - may use melatonin for insomnia   ~~~~~~~~~~~~~~~~~~~~~~~~~~~~~~~~~~~~~~~~~~~~~~~~~~~~~~~~~~~~~~~~~  DR. PENUMALLI'S GUIDE TO HAPPY AND HEALTHY LIVING These are some of my general health and wellness recommendations. Some of them may apply to you better than others. Please use common sense as you try these suggestions and feel free to ask me any questions.   ACTIVITY/FITNESS Mental, social, emotional and physical stimulation are very important for brain and body health. Try learning a new activity (arts, music, language, sports, games).  Keep moving your body to the best of your abilities. You can do this at home, inside or outside, the park, community center, gym or anywhere you like. Consider a physical therapist or personal trainer to get started. Consider the app Sworkit. Fitness trackers such as smart-watches, smart-phones or Fitbits can help as well.   NUTRITION Eat more plants: colorful vegetables, nuts, seeds and berries.  Eat less sugar, salt, preservatives and processed foods.  Avoid toxins such as cigarettes and alcohol.  Drink water  when you are thirsty. Warm water with a slice of lemon is an excellent morning drink to start the day.  Consider these websites for more information The Nutrition Source (https://www.henry-hernandez.biz/) Precision Nutrition (WindowBlog.ch)   RELAXATION Consider practicing mindfulness meditation or other relaxation techniques such as deep breathing, prayer, yoga, tai chi, massage. See website mindful.org or the apps Headspace or Calm to help get started.   SLEEP Try to get at least 7-8+ hours sleep per day. Regular exercise and reduced caffeine will help you sleep better. Practice good sleep hygeine techniques. See website sleep.org for more information.   PLANNING Prepare estate planning, living will, healthcare POA documents. Sometimes this is best planned with the help of an attorney. Theconversationproject.org and agingwithdignity.org are excellent resources.

## 2017-05-28 ENCOUNTER — Encounter: Payer: Medicare Other | Admitting: Vascular Surgery

## 2017-05-28 ENCOUNTER — Encounter (HOSPITAL_COMMUNITY): Payer: Medicare Other

## 2017-06-10 ENCOUNTER — Telehealth: Payer: Self-pay | Admitting: *Deleted

## 2017-06-10 NOTE — Telephone Encounter (Signed)
Tried to reach pt to discuss his monitor results.  Left a message for pt call back.

## 2017-06-10 NOTE — Telephone Encounter (Signed)
-----   Message from Charlie Pitter, [place] sent at 06/10/2017  5:45 AM EST ----- Please make sure patient is aware of result ----- Message ----- From: Jettie Booze, MD Sent: 06/09/2017   7:26 PM To: Charlie Pitter, PA-C

## 2017-06-11 NOTE — Telephone Encounter (Signed)
Per Drue Novel, RN, pt has been left a detailed message re: his monitor results.

## 2017-07-05 DIAGNOSIS — G0490 Encephalitis and encephalomyelitis, unspecified: Secondary | ICD-10-CM | POA: Diagnosis not present

## 2017-07-05 DIAGNOSIS — Z6832 Body mass index (BMI) 32.0-32.9, adult: Secondary | ICD-10-CM | POA: Diagnosis not present

## 2017-07-05 DIAGNOSIS — Z1339 Encounter for screening examination for other mental health and behavioral disorders: Secondary | ICD-10-CM | POA: Diagnosis not present

## 2017-07-05 DIAGNOSIS — I2510 Atherosclerotic heart disease of native coronary artery without angina pectoris: Secondary | ICD-10-CM | POA: Diagnosis not present

## 2017-08-12 ENCOUNTER — Other Ambulatory Visit: Payer: Self-pay | Admitting: Cardiology

## 2017-08-20 ENCOUNTER — Ambulatory Visit: Payer: Medicare Other | Admitting: Diagnostic Neuroimaging

## 2017-08-21 ENCOUNTER — Encounter: Payer: Self-pay | Admitting: Diagnostic Neuroimaging

## 2017-09-13 ENCOUNTER — Other Ambulatory Visit: Payer: Self-pay | Admitting: Cardiology

## 2017-09-13 DIAGNOSIS — I739 Peripheral vascular disease, unspecified: Secondary | ICD-10-CM | POA: Diagnosis not present

## 2017-09-13 DIAGNOSIS — Z6832 Body mass index (BMI) 32.0-32.9, adult: Secondary | ICD-10-CM | POA: Diagnosis not present

## 2017-09-13 DIAGNOSIS — J40 Bronchitis, not specified as acute or chronic: Secondary | ICD-10-CM | POA: Diagnosis not present

## 2017-09-13 DIAGNOSIS — D2222 Melanocytic nevi of left ear and external auricular canal: Secondary | ICD-10-CM | POA: Diagnosis not present

## 2017-09-24 DIAGNOSIS — Z6831 Body mass index (BMI) 31.0-31.9, adult: Secondary | ICD-10-CM | POA: Diagnosis not present

## 2017-09-24 DIAGNOSIS — J189 Pneumonia, unspecified organism: Secondary | ICD-10-CM | POA: Diagnosis not present

## 2017-09-27 DIAGNOSIS — I6523 Occlusion and stenosis of bilateral carotid arteries: Secondary | ICD-10-CM | POA: Diagnosis not present

## 2017-09-30 ENCOUNTER — Other Ambulatory Visit: Payer: Self-pay

## 2017-09-30 DIAGNOSIS — I6529 Occlusion and stenosis of unspecified carotid artery: Secondary | ICD-10-CM

## 2017-09-30 DIAGNOSIS — I739 Peripheral vascular disease, unspecified: Secondary | ICD-10-CM

## 2017-10-13 DIAGNOSIS — S41119A Laceration without foreign body of unspecified upper arm, initial encounter: Secondary | ICD-10-CM | POA: Diagnosis not present

## 2017-10-28 DIAGNOSIS — L821 Other seborrheic keratosis: Secondary | ICD-10-CM | POA: Diagnosis not present

## 2017-10-28 DIAGNOSIS — L814 Other melanin hyperpigmentation: Secondary | ICD-10-CM | POA: Diagnosis not present

## 2017-10-28 DIAGNOSIS — L578 Other skin changes due to chronic exposure to nonionizing radiation: Secondary | ICD-10-CM | POA: Diagnosis not present

## 2017-10-28 DIAGNOSIS — D485 Neoplasm of uncertain behavior of skin: Secondary | ICD-10-CM | POA: Diagnosis not present

## 2017-11-04 DIAGNOSIS — R072 Precordial pain: Secondary | ICD-10-CM | POA: Diagnosis not present

## 2017-11-04 DIAGNOSIS — Z7902 Long term (current) use of antithrombotics/antiplatelets: Secondary | ICD-10-CM | POA: Diagnosis not present

## 2017-11-04 DIAGNOSIS — I2510 Atherosclerotic heart disease of native coronary artery without angina pectoris: Secondary | ICD-10-CM | POA: Diagnosis not present

## 2017-11-04 DIAGNOSIS — Z8673 Personal history of transient ischemic attack (TIA), and cerebral infarction without residual deficits: Secondary | ICD-10-CM | POA: Diagnosis not present

## 2017-11-04 DIAGNOSIS — J449 Chronic obstructive pulmonary disease, unspecified: Secondary | ICD-10-CM | POA: Diagnosis not present

## 2017-11-04 DIAGNOSIS — I252 Old myocardial infarction: Secondary | ICD-10-CM | POA: Diagnosis not present

## 2017-11-04 DIAGNOSIS — R079 Chest pain, unspecified: Secondary | ICD-10-CM | POA: Diagnosis not present

## 2017-11-04 DIAGNOSIS — F0390 Unspecified dementia without behavioral disturbance: Secondary | ICD-10-CM | POA: Diagnosis not present

## 2017-11-04 DIAGNOSIS — Z7982 Long term (current) use of aspirin: Secondary | ICD-10-CM | POA: Diagnosis not present

## 2017-11-04 DIAGNOSIS — Z79899 Other long term (current) drug therapy: Secondary | ICD-10-CM | POA: Diagnosis not present

## 2017-11-04 DIAGNOSIS — F17200 Nicotine dependence, unspecified, uncomplicated: Secondary | ICD-10-CM | POA: Diagnosis not present

## 2017-11-04 DIAGNOSIS — I10 Essential (primary) hypertension: Secondary | ICD-10-CM | POA: Diagnosis not present

## 2017-11-05 DIAGNOSIS — R079 Chest pain, unspecified: Secondary | ICD-10-CM | POA: Diagnosis not present

## 2017-11-06 ENCOUNTER — Other Ambulatory Visit: Payer: Self-pay | Admitting: Cardiology

## 2017-11-06 NOTE — Telephone Encounter (Signed)
REFILL 

## 2017-11-18 ENCOUNTER — Other Ambulatory Visit: Payer: Self-pay | Admitting: Cardiology

## 2017-11-20 ENCOUNTER — Encounter (HOSPITAL_COMMUNITY): Payer: Medicare Other

## 2017-11-20 ENCOUNTER — Encounter: Payer: Medicare Other | Admitting: Vascular Surgery

## 2017-11-24 DIAGNOSIS — R079 Chest pain, unspecified: Secondary | ICD-10-CM | POA: Diagnosis not present

## 2017-11-24 DIAGNOSIS — I2510 Atherosclerotic heart disease of native coronary artery without angina pectoris: Secondary | ICD-10-CM | POA: Diagnosis not present

## 2017-11-24 DIAGNOSIS — I214 Non-ST elevation (NSTEMI) myocardial infarction: Secondary | ICD-10-CM | POA: Diagnosis not present

## 2017-11-24 DIAGNOSIS — R072 Precordial pain: Secondary | ICD-10-CM | POA: Diagnosis not present

## 2017-11-24 DIAGNOSIS — R05 Cough: Secondary | ICD-10-CM | POA: Diagnosis not present

## 2017-11-24 DIAGNOSIS — R0602 Shortness of breath: Secondary | ICD-10-CM | POA: Diagnosis not present

## 2017-11-25 ENCOUNTER — Inpatient Hospital Stay (HOSPITAL_COMMUNITY)
Admission: AD | Admit: 2017-11-25 | Discharge: 2017-11-27 | DRG: 287 | Disposition: A | Discharge status: Home / Self Care | Payer: Medicare Other | Source: Other Acute Inpatient Hospital | Attending: Interventional Cardiology | Admitting: Interventional Cardiology

## 2017-11-25 DIAGNOSIS — F17210 Nicotine dependence, cigarettes, uncomplicated: Secondary | ICD-10-CM | POA: Diagnosis present

## 2017-11-25 DIAGNOSIS — Z79899 Other long term (current) drug therapy: Secondary | ICD-10-CM | POA: Diagnosis not present

## 2017-11-25 DIAGNOSIS — Z888 Allergy status to other drugs, medicaments and biological substances status: Secondary | ICD-10-CM | POA: Diagnosis not present

## 2017-11-25 DIAGNOSIS — E7800 Pure hypercholesterolemia, unspecified: Secondary | ICD-10-CM | POA: Diagnosis present

## 2017-11-25 DIAGNOSIS — M79604 Pain in right leg: Secondary | ICD-10-CM | POA: Diagnosis present

## 2017-11-25 DIAGNOSIS — J449 Chronic obstructive pulmonary disease, unspecified: Secondary | ICD-10-CM | POA: Diagnosis present

## 2017-11-25 DIAGNOSIS — R0602 Shortness of breath: Secondary | ICD-10-CM | POA: Diagnosis not present

## 2017-11-25 DIAGNOSIS — D509 Iron deficiency anemia, unspecified: Secondary | ICD-10-CM | POA: Diagnosis present

## 2017-11-25 DIAGNOSIS — Z885 Allergy status to narcotic agent status: Secondary | ICD-10-CM | POA: Diagnosis not present

## 2017-11-25 DIAGNOSIS — M79609 Pain in unspecified limb: Secondary | ICD-10-CM | POA: Diagnosis not present

## 2017-11-25 DIAGNOSIS — I200 Unstable angina: Secondary | ICD-10-CM | POA: Diagnosis not present

## 2017-11-25 DIAGNOSIS — Z8249 Family history of ischemic heart disease and other diseases of the circulatory system: Secondary | ICD-10-CM

## 2017-11-25 DIAGNOSIS — I340 Nonrheumatic mitral (valve) insufficiency: Secondary | ICD-10-CM | POA: Diagnosis not present

## 2017-11-25 DIAGNOSIS — I25110 Atherosclerotic heart disease of native coronary artery with unstable angina pectoris: Secondary | ICD-10-CM | POA: Diagnosis not present

## 2017-11-25 DIAGNOSIS — D649 Anemia, unspecified: Secondary | ICD-10-CM | POA: Diagnosis present

## 2017-11-25 DIAGNOSIS — Z7982 Long term (current) use of aspirin: Secondary | ICD-10-CM | POA: Diagnosis not present

## 2017-11-25 DIAGNOSIS — Z8673 Personal history of transient ischemic attack (TIA), and cerebral infarction without residual deficits: Secondary | ICD-10-CM | POA: Diagnosis not present

## 2017-11-25 DIAGNOSIS — R072 Precordial pain: Secondary | ICD-10-CM | POA: Diagnosis not present

## 2017-11-25 DIAGNOSIS — I25700 Atherosclerosis of coronary artery bypass graft(s), unspecified, with unstable angina pectoris: Secondary | ICD-10-CM | POA: Diagnosis present

## 2017-11-25 DIAGNOSIS — K219 Gastro-esophageal reflux disease without esophagitis: Secondary | ICD-10-CM | POA: Diagnosis present

## 2017-11-25 DIAGNOSIS — Z96641 Presence of right artificial hip joint: Secondary | ICD-10-CM | POA: Diagnosis present

## 2017-11-25 DIAGNOSIS — R05 Cough: Secondary | ICD-10-CM | POA: Diagnosis not present

## 2017-11-25 DIAGNOSIS — R0609 Other forms of dyspnea: Secondary | ICD-10-CM | POA: Diagnosis present

## 2017-11-25 DIAGNOSIS — I10 Essential (primary) hypertension: Secondary | ICD-10-CM | POA: Diagnosis present

## 2017-11-25 DIAGNOSIS — Z8546 Personal history of malignant neoplasm of prostate: Secondary | ICD-10-CM

## 2017-11-25 DIAGNOSIS — M79605 Pain in left leg: Secondary | ICD-10-CM | POA: Diagnosis present

## 2017-11-25 DIAGNOSIS — I2510 Atherosclerotic heart disease of native coronary artery without angina pectoris: Secondary | ICD-10-CM | POA: Diagnosis not present

## 2017-11-25 DIAGNOSIS — Z7902 Long term (current) use of antithrombotics/antiplatelets: Secondary | ICD-10-CM

## 2017-11-25 DIAGNOSIS — E785 Hyperlipidemia, unspecified: Secondary | ICD-10-CM | POA: Diagnosis not present

## 2017-11-25 DIAGNOSIS — I214 Non-ST elevation (NSTEMI) myocardial infarction: Secondary | ICD-10-CM | POA: Diagnosis not present

## 2017-11-25 DIAGNOSIS — I70213 Atherosclerosis of native arteries of extremities with intermittent claudication, bilateral legs: Secondary | ICD-10-CM | POA: Diagnosis not present

## 2017-11-25 DIAGNOSIS — Z955 Presence of coronary angioplasty implant and graft: Secondary | ICD-10-CM | POA: Diagnosis not present

## 2017-11-25 DIAGNOSIS — Z716 Tobacco abuse counseling: Secondary | ICD-10-CM | POA: Diagnosis not present

## 2017-11-25 DIAGNOSIS — I252 Old myocardial infarction: Secondary | ICD-10-CM | POA: Diagnosis not present

## 2017-11-25 DIAGNOSIS — Z720 Tobacco use: Secondary | ICD-10-CM | POA: Diagnosis not present

## 2017-11-25 DIAGNOSIS — R079 Chest pain, unspecified: Secondary | ICD-10-CM | POA: Diagnosis not present

## 2017-11-25 DIAGNOSIS — Z951 Presence of aortocoronary bypass graft: Secondary | ICD-10-CM | POA: Diagnosis not present

## 2017-11-25 HISTORY — DX: Unspecified osteoarthritis, unspecified site: M19.90

## 2017-11-25 HISTORY — DX: Cerebral infarction, unspecified: I63.9

## 2017-11-25 HISTORY — DX: Unspecified asthma, uncomplicated: J45.909

## 2017-11-25 HISTORY — DX: Dyspnea, unspecified: R06.00

## 2017-11-25 LAB — HEPATIC FUNCTION PANEL
ALT: 16 U/L — ABNORMAL LOW (ref 17–63)
AST: 17 U/L (ref 15–41)
Albumin: 3.3 g/dL — ABNORMAL LOW (ref 3.5–5.0)
Alkaline Phosphatase: 82 U/L (ref 38–126)
Total Bilirubin: 0.5 mg/dL (ref 0.3–1.2)
Total Protein: 6.2 g/dL — ABNORMAL LOW (ref 6.5–8.1)

## 2017-11-25 LAB — HEMOGLOBIN A1C
Hgb A1c MFr Bld: 5.5 % (ref 4.8–5.6)
MEAN PLASMA GLUCOSE: 111.15 mg/dL

## 2017-11-25 LAB — PROTIME-INR
INR: 1.03
Prothrombin Time: 13.4 seconds (ref 11.4–15.2)

## 2017-11-25 LAB — BASIC METABOLIC PANEL
Anion gap: 10 (ref 5–15)
BUN: 11 mg/dL (ref 6–20)
CO2: 23 mmol/L (ref 22–32)
CREATININE: 1.02 mg/dL (ref 0.61–1.24)
Calcium: 8.4 mg/dL — ABNORMAL LOW (ref 8.9–10.3)
Chloride: 104 mmol/L (ref 101–111)
GFR calc Af Amer: >60 mL/min (ref >60)
GLUCOSE: 92 mg/dL (ref 65–99)
Potassium: 4.1 mmol/L (ref 3.5–5.1)
Sodium: 137 mmol/L (ref 135–145)

## 2017-11-25 LAB — BRAIN NATRIURETIC PEPTIDE: B Natriuretic Peptide: 58 pg/mL (ref 0.0–100.0)

## 2017-11-25 LAB — MRSA PCR SCREENING: MRSA by PCR: NEGATIVE

## 2017-11-25 LAB — TROPONIN I: Troponin I: <0.03 ng/mL (ref <0.03)

## 2017-11-25 MED ORDER — NICOTINE 14 MG/24HR TD PT24
14.0000 mg | MEDICATED_PATCH | Freq: Every day | TRANSDERMAL | Status: DC
  Filled 2017-11-25: qty 1

## 2017-11-25 MED ORDER — ISOSORBIDE MONONITRATE ER 30 MG PO TB24: 30.0000 mg | ORAL_TABLET | Freq: Every day | ORAL | Status: DC

## 2017-11-25 MED ORDER — CLOPIDOGREL BISULFATE 75 MG PO TABS
75.0000 mg | ORAL_TABLET | Freq: Every day | ORAL | Status: DC
  Administered 2017-11-26 – 2017-11-27 (×2): 75 mg via ORAL
  Filled 2017-11-25 (×2): qty 1

## 2017-11-25 MED ORDER — HEPARIN (PORCINE) IN NACL 100-0.45 UNIT/ML-% IJ SOLN
1200.0000 [IU]/h | INTRAMUSCULAR | Status: DC
  Administered 2017-11-26: 1200 [IU]/h via INTRAVENOUS
  Filled 2017-11-25: qty 250

## 2017-11-25 MED ORDER — ACETAMINOPHEN 325 MG PO TABS
650.0000 mg | ORAL_TABLET | ORAL | Status: DC | PRN
  Filled 2017-11-25: qty 2

## 2017-11-25 MED ORDER — METOPROLOL TARTRATE 25 MG PO TABS
25.0000 mg | ORAL_TABLET | Freq: Two times a day (BID) | ORAL | Status: DC
  Administered 2017-11-26 – 2017-11-27 (×3): 25 mg via ORAL
  Filled 2017-11-25 (×3): qty 1

## 2017-11-25 MED ORDER — ASPIRIN 81 MG PO TABS: 81.0000 mg | ORAL_TABLET | Freq: Every day | ORAL | Status: DC

## 2017-11-25 MED ORDER — ONDANSETRON HCL 4 MG/2ML IJ SOLN: 4.0000 mg | Freq: Four times a day (QID) | INTRAMUSCULAR | Status: DC | PRN

## 2017-11-25 MED ORDER — ONDANSETRON HCL 4 MG/2ML IJ SOLN
4.0000 mg | Freq: Four times a day (QID) | INTRAMUSCULAR | Status: DC | PRN
  Administered 2017-11-26 (×3): 4 mg via INTRAVENOUS
  Filled 2017-11-25 (×3): qty 2

## 2017-11-25 MED ORDER — EZETIMIBE 10 MG PO TABS
10.0000 mg | ORAL_TABLET | Freq: Every day | ORAL | Status: DC
  Administered 2017-11-26 – 2017-11-27 (×2): 10 mg via ORAL
  Filled 2017-11-25 (×2): qty 1

## 2017-11-25 MED ORDER — ASPIRIN 300 MG RE SUPP: 300.0000 mg | RECTAL | Status: DC

## 2017-11-25 MED ORDER — ASPIRIN 81 MG PO CHEW: 324.0000 mg | CHEWABLE_TABLET | ORAL | Status: DC

## 2017-11-25 MED ORDER — NITROGLYCERIN 0.4 MG SL SUBL: 0.4000 mg | SUBLINGUAL_TABLET | SUBLINGUAL | Status: DC | PRN

## 2017-11-25 MED ORDER — ASPIRIN EC 81 MG PO TBEC
81.0000 mg | DELAYED_RELEASE_TABLET | Freq: Every day | ORAL | Status: DC
  Administered 2017-11-27: 81 mg via ORAL
  Filled 2017-11-25: qty 1

## 2017-11-25 MED ORDER — ACETAMINOPHEN 325 MG PO TABS
650.0000 mg | ORAL_TABLET | ORAL | Status: DC | PRN
Start: 2017-11-25 — End: 2017-11-25

## 2017-11-25 NOTE — H&P (Signed)
Cardiology Admission History and Physical:   Patient ID: GARRIT MARROW; MRN: [id number]; DOB: [birth year]   Admission date: 11/25/2017  Primary Care Provider: [name], MD Primary Cardiologist: Dr Irish Lack Primary Electrophysiologist:  Chief Complaint:  Chest pain  Patient Profile:   [name] is a 67 y.o. male with history of CAD (s/p CABG x 3 in 2008, PCI as below), prostate CA, GERD, HTN, HLD, moderate MR by cath 12/2016, statin intolerance, tobacco abuse, cerebrovascular disease by imaging 02/2017, syncope 02/2017 who is  Admitted from [facility] as a  Transfer  For  Angina and abnl EKG.   History of Present Illness:   [name] is a 67 y.o. male with history of CAD (s/p CABG x 3 in 2008, PCI as below), prostate CA, GERD, HTN, HLD, moderate MR by cath 12/2016, statin intolerance, tobacco abuse, cerebrovascular disease by imaging 02/2017, syncope 02/2017,  Admitted from [facility] as a  Transfer  For  Angina and abnl EKG.    history of CAD s/p CABGx3 in 2008 in [place]. In 10/17, he had a NSTEMI and a DES to the first diagonal in [facility]. He was evaluated by Dr. Irish Lack in 12/2016 and reported symptoms concerning for unstable angina. At that visit his wife also raised concern that he had been "staggering for weeks." A cardiac catheterization showed a patent LIMA-OM and RIMA-D2 with a "kink" along the RIMA which resolved with intracoronary NTG and was suggestive of a coronary vasospasm. He continued to have pain following the procedure, therefore he went back to the cath lab two days later and underwent PCI with DES to 95% stenosed ostial LAD. Given his coronary spasms, nitrates were also recommended but patient reported this was not called into his pharmacy. At f/u visit 01/2017, Imdur was added for recurrent chest pain. He was readmitted 6/25-6/26/18 with chest pain. Troponins were negative. He underwent a lexiscan myoview that showed no evidence of ischemia. D-dimer  was elevated, but CTA was negative for PE. Lisinopril was held due to cough. Protonix was added in case symptoms were GI related. He also was treated with steroid taper due to concern for possibe COPD exacerbation. He was then admitted to [facility] 02/2017 for syncope, possibly vasovagal in setting of leg pain. Orthostatics were negative and etiology was unknown. He also had atypical CP during that stay with negative troponins. Outpatient event monitor was recommended but do not see this arranged. Carotid ultrasound showed mod-large bilateral atherosclerotic plaque compatible with 50-69% luminal narrowing - CT angio neck showed moderate to advanced diffuse disease with severe stenosis vertebral artery bilaterally.   The patient then was admitted to [facility] in August with an episode of recurrent syncope. Although the episode above was reported to occur while standing, this was when he was sitting outside in a chair, felt very nauseated, then was noted to pass out for several seconds. He was very confused afterwards. Meclizine helps but only temporarily. No further CP or SOB. No LEE. Last labs 03/2017 showed Hgb 11.7, glucose 181, CR 1.21. Last LDL 01/2017 was 97.     Past Medical History:  Diagnosis Date  . Cancer [facility])    history of prostate cancer- cryoablation  . Carotid artery disease ([place])   . Cerebrovascular disease   . COPD (chronic obstructive pulmonary disease) ([place])   . Coronary artery disease    a. 2008 s/p CABG x 3 [facility]): LIMA->OM, RIMA->Diag, VG->RPDA;  b. 05/2016 NSTEMI/PCI: DES ->prox LAD and  ost diag;  c. 12/2016 Cath/PCI: LM 95d, LAD 95ost (2.75x8 Promus Premier DES), patent prox stent, D2 30, LCX nl, OM2 95, RCA min irregs, LIMA->OM2 ok, RIMA->D2 85 "kink"-improved w/ IC NTG, VG->RPDA 100.  Marland Kitchen GERD (gastroesophageal reflux disease)   . Hyperlipidemia   . Hypertension   . Mitral regurgitation    a. mod by cath 12/2016.  . Statin intolerance   . Syncope 02/2017  . Tobacco  abuse      Past Surgical History:  Procedure Laterality Date  . ABDOMINAL AORTOGRAM N/A 12/26/2016   Procedure: Abdominal Aortogram;  Surgeon: Troy Sine, MD;  Location: [place] [facility];  Service: Cardiovascular;  Laterality: N/A;  . BACK SURGERY  2009  . CORONARY ANGIOGRAPHY N/A 12/28/2016   Procedure: Coronary Angiography;  Surgeon: Burnell Blanks, MD;  Location: [place] [facility];  Service: Cardiovascular;  Laterality: N/A;  . CORONARY ARTERY BYPASS GRAFT  2008  . CORONARY ATHERECTOMY N/A 12/28/2016   Procedure: Coronary Atherectomy;  Surgeon: Burnell Blanks, MD;  Location: [place] [facility];  Service: Cardiovascular;  Laterality: N/A;  . CORONARY STENT INTERVENTION N/A 12/28/2016   Procedure: Coronary Stent Intervention;  Surgeon: Burnell Blanks, MD;  Location: [place] [facility];  Service: Cardiovascular;  Laterality: N/A;  . LEFT HEART CATH Left 05/07/2016   PR CATH PLACE/CORON ANGIO, IMG SUPER/TERP, W  [facility] CATH; SEVICE: CARDIOLOGY  . LEFT HEART CATH AND CORS/GRAFTS ANGIOGRAPHY N/A 12/26/2016   Procedure: Left Heart Cath and Cors/Grafts Angiography;  Surgeon: Troy Sine, MD;  Location: [place] [facility];  Service: Cardiovascular;  Laterality: N/A;  . LEFT HEART VENTRICULOGRAPHY Left 05/14/2016   PR CATH PLACE/ CORON ANGIO, IMG SUPER/ INTERP, W   . TOTAL HIP ARTHROPLASTY Right 2013     Medications Prior to Admission: Prior to Admission medications   Medication Sig Start Date End Date Taking? Authorizing Provider  aspirin 81 MG tablet Take 1 tablet (81 mg total) by mouth daily. 12/29/16   Theora Gianotti, NP  clopidogrel (PLAVIX) 75 MG tablet Take 75 mg by mouth daily.    [provider]  ezetimibe (ZETIA) 10 MG tablet TAKE 1 TABLET BY MOUTH ONCE (1) DAILY 11/18/17   Lyda Jester M, PA-C  hydrochlorothiazide (MICROZIDE) 12.5 MG capsule Take 12.5 mg by mouth daily.    [provider]  isosorbide mononitrate  (IMDUR) 30 MG 24 hr tablet Take 1 tablet (30 mg total) by mouth daily. KEEP OV. 11/06/17   Lyda Jester M, PA-C  meclizine (ANTIVERT) 25 MG tablet Take 1 tablet (25 mg total) by mouth 3 (three) times daily as needed for dizziness. 02/14/17   Fritzi Mandes, MD  metoprolol tartrate (LOPRESSOR) 25 MG tablet TAKE 1 TABLET BY MOUTH TWICE (2) DAILY 08/12/17   Lyda Jester M, PA-C  nicotine (NICODERM CQ - DOSED IN MG/24 HOURS) 14 mg/24hr patch Place 1 patch (14 mg total) onto the skin daily. 12/29/16   Theora Gianotti, NP  nitroGLYCERIN (NITROSTAT) 0.4 MG SL tablet Place 1 tablet (0.4 mg total) under the tongue as needed for chest pain. X 3 doses 12/29/16   Theora Gianotti, NP     Allergies:    Allergies  Allergen Reactions  . Ciprofloxacin Hcl Nausea Only    Wife reported  . Codeine Other (See Comments)    Other reaction(s): GI Upset (intolerance) Pt not sure  . Hydrocodone-Acetaminophen Other (See Comments)    Other reaction(s): GI Upset (intolerance)  .  Lisinopril     Wife reported  . Meclizine     Wife reported  . Metronidazole Nausea Only    Wife reported  . Pantoprazole     Wife reported  . Ranitidine     Wife reported  . Simvastatin Other (See Comments)    Other reaction(s): GI Upset (intolerance)  . Influenza Vaccines Nausea And Vomiting    Social History:   Social History   Socioeconomic History  . Marital status: Married    Spouse name: Katharine Look  . Number of children: 3  . Years of education: 9  . Highest education level: Not on file  Occupational History    Comment: truck driver, retired  Scientific laboratory technician  . Financial resource strain: Not on file  . Food insecurity:    Worry: Not on file    Inability: Not on file  . Transportation needs:    Medical: Not on file    Non-medical: Not on file  Tobacco Use  . Smoking status: Current Every Day Smoker    Packs/day: 0.50    Years: 56.00    Pack years: 28.00    Last attempt to quit: 05/11/2016     Years since quitting: 1.5  . Smokeless tobacco: Never Used  . Tobacco comment: Refused  Substance and Sexual Activity  . Alcohol use: No  . Drug use: Yes  . Sexual activity: Yes    Birth control/protection: Other-see comments  Lifestyle  . Physical activity:    Days per week: Not on file    Minutes per session: Not on file  . Stress: Not on file  Relationships  . Social connections:    Talks on phone: Not on file    Gets together: Not on file    Attends religious service: Not on file    Active member of club or organization: Not on file    Attends meetings of clubs or organizations: Not on file    Relationship status: Not on file  . Intimate partner violence:    Fear of current or ex partner: Not on file    Emotionally abused: Not on file    Physically abused: Not on file    Forced sexual activity: Not on file  Other Topics Concern  . Not on file  Social History Narrative   Lives at home with family. Works as a Administrator. Independent otherwise   Caffeine- 2 cups daily    Family History:   Cad, htn dm  The patient's family history includes Hypertension in his brother, brother, and sister; Kidney failure in his mother.    ROS:  Please see the history of present illness.  All other ROS reviewed and negative, except  Exertional chest pain  And  dyspnea     PHYSICAL EXAM:   VS:  BP 138/80   Pulse 68   Ht 5\' 11"  (7.412 m)   Wt 221 lb 12.8 oz (100.6 kg)   SpO2 98%   BMI 30.93 kg/m   BMI: Body mass index is 30.93 kg/m. GEN: Well nourished, well developed WM, ruddy complexion, in no acute distress  HEENT: normocephalic, atraumatic Neck: no JVD, carotid bruits, or masses Cardiac: RRR; no murmurs, rubs, or gallops, no edema  Respiratory:  clear to auscultation bilaterally, normal work of breathing GI: soft, nontender, nondistended, + BS MS: no deformity or atrophy  Skin: warm and dry, no rash Neuro:  Alert and Oriented x 3, Strength and sensation are intact, follows  commands, very unsteady  when even leaning forward Psych: euthymic mood, flattened but responsive affect  EKG:  The ECG that was done [facility]  was personally reviewed and demonstrates nsr  , subtle inferior  ST 0.5-40mm elevation   Relevant CV Studies:                          . Coronary artery disease    a. 2008 s/p CABG x 3 [facility]): LIMA->OM, RIMA->Diag, VG->RPDA;  b. 05/2016 NSTEMI/PCI: DES ->prox LAD and ost diag;  c. 12/2016 Cath/PCI: LM 95d, LAD 95ost (2.75x8 Promus Premier DES), patent prox stent, D2 30, LCX nl, OM2 95, RCA min irregs, LIMA->OM2 ok, RIMA->D2 85 "kink"-improved w/ IC NTG, VG->RPDA 100.                           02/2017        Laboratory Data:  ChemistryNo results for input(s): NA, K, CL, CO2, GLUCOSE, BUN, CREATININE, CALCIUM, GFRNONAA, GFRAA, ANIONGAP in the last 168 hours.  No results for input(s): PROT, ALBUMIN, AST, ALT, ALKPHOS, BILITOT in the last 168 hours. HematologyNo results for input(s): WBC, RBC, HGB, HCT, MCV, MCH, MCHC, RDW, PLT in the last 168 hours. Cardiac EnzymesNo results for input(s): TROPONINI in the last 168 hours. No results for input(s): TROPIPOC in the last 168 hours.  BNPNo results for input(s): BNP, PROBNP in the last 168 hours.  DDimer No results for input(s): DDIMER in the last 168 hours.  Radiology/Studies:  No results found.  Assessment and Plan:   1. Unstable angina 2. Abnormal EKG/ST  Subtle inferior  Wall upsloping  0.5-86mm elevtiosn /[facility] ekg 3. Moderate mitral regurgiotation 4. Inferior  Wall hypokinesis/ECHO at [place] gtt, nitro paste, Metoprolol, zetia, ON PLAVIX NPO CATH EVAL AM  Statin on hold  Due to allergy?severe myalgia Oxygen 2L    5. CAD - no interim anginal events. EKG reassuring. Continue ASA, BB, Plavix, Zetia. 6. Mitral regurgitation - moderate by prior cath, will evaluate by echo. 7. HTN - controlled, upper-normal. Would not aggressively  reduce BP further at this time due this dizziness. Hyperlipidemia - continue Zetia. Carotid artery stenosis- recent  Neurology follow up  Pt  ALSO C/O  R>L  LEG  PAIN  AND  VENOUS/ARTERIAL ULTRASOUND IS  PENDING.  Severity of Illness: The appropriate patient status for this patient is INPATIENT. Inpatient status is judged to be reasonable and necessary in order to provide the required intensity of service to ensure the patient's safety. The patient's presenting symptoms, physical exam findings, and initial radiographic and laboratory data in the context of their chronic comorbidities is felt to place them at high risk for further clinical deterioration. Furthermore, it is not anticipated that the patient will be medically stable for discharge from the hospital within 2 midnights of admission. The following factors support the patient status of inpatient.   " The patient's presenting symptoms include angina, chest pain , exertional dyspnea. " The worrisome physical exam findings include chest pain , abnl ekg " The initial radiographic and laboratory data are worrisome because of ABNL EKG, ANGINA " The chronic co-morbidities include  Cad, htn , DYSLIPIDEMIA , PRIOR cabg, RECENT cath    * I certify that at the point of admission it is my clinical judgment that the patient will require inpatient hospital care spanning beyond 2 midnights from the point of admission due to  high intensity of service, high risk for further deterioration and high frequency of surveillance required.*    For questions or updates, please contact [place] Please consult [email or web address] for contact info under Cardiology/STEMI.    Signed, Johna Sheriff, MD  11/25/2017 10:04 PM

## 2017-11-26 ENCOUNTER — Inpatient Hospital Stay (HOSPITAL_COMMUNITY): Payer: Medicare Other

## 2017-11-26 ENCOUNTER — Other Ambulatory Visit: Payer: Self-pay

## 2017-11-26 ENCOUNTER — Encounter (HOSPITAL_COMMUNITY): Payer: Self-pay

## 2017-11-26 ENCOUNTER — Inpatient Hospital Stay (HOSPITAL_COMMUNITY)
Admission: AD | Disposition: A | Discharge status: Home / Self Care | Payer: Self-pay | Source: Other Acute Inpatient Hospital | Attending: Interventional Cardiology

## 2017-11-26 DIAGNOSIS — E785 Hyperlipidemia, unspecified: Secondary | ICD-10-CM

## 2017-11-26 DIAGNOSIS — I340 Nonrheumatic mitral (valve) insufficiency: Secondary | ICD-10-CM

## 2017-11-26 DIAGNOSIS — I200 Unstable angina: Secondary | ICD-10-CM

## 2017-11-26 DIAGNOSIS — Z720 Tobacco use: Secondary | ICD-10-CM

## 2017-11-26 DIAGNOSIS — M79609 Pain in unspecified limb: Secondary | ICD-10-CM

## 2017-11-26 DIAGNOSIS — I25110 Atherosclerotic heart disease of native coronary artery with unstable angina pectoris: Principal | ICD-10-CM

## 2017-11-26 DIAGNOSIS — I2510 Atherosclerotic heart disease of native coronary artery without angina pectoris: Secondary | ICD-10-CM

## 2017-11-26 HISTORY — PX: LEFT HEART CATH AND CORS/GRAFTS ANGIOGRAPHY: CATH118250

## 2017-11-26 LAB — BASIC METABOLIC PANEL
Anion gap: 7 (ref 5–15)
BUN: 9 mg/dL (ref 6–20)
CALCIUM: 8.7 mg/dL — AB (ref 8.9–10.3)
CHLORIDE: 107 mmol/L (ref 101–111)
CO2: 24 mmol/L (ref 22–32)
CREATININE: 1.03 mg/dL (ref 0.61–1.24)
GFR calc Af Amer: >60 mL/min (ref >60)
GFR calc non Af Amer: >60 mL/min (ref >60)
Glucose, Bld: 95 mg/dL (ref 65–99)
Potassium: 4.1 mmol/L (ref 3.5–5.1)
SODIUM: 138 mmol/L (ref 135–145)

## 2017-11-26 LAB — CBC
HCT: 31 % — ABNORMAL LOW (ref 39.0–52.0)
Hemoglobin: 9.4 g/dL — ABNORMAL LOW (ref 13.0–17.0)
MCH: 24.1 pg — AB (ref 26.0–34.0)
MCHC: 30.3 g/dL (ref 30.0–36.0)
MCV: 79.5 fL (ref 78.0–100.0)
Platelets: 327 10*3/uL (ref 150–400)
RBC: 3.9 MIL/uL — ABNORMAL LOW (ref 4.22–5.81)
RDW: 15.5 % (ref 11.5–15.5)
WBC: 7.5 10*3/uL (ref 4.0–10.5)

## 2017-11-26 LAB — TROPONIN I: Troponin I: <0.03 ng/mL (ref <0.03)

## 2017-11-26 LAB — HEPARIN LEVEL (UNFRACTIONATED)
Heparin Unfractionated: 0.46 IU/mL (ref 0.30–0.70)
Heparin Unfractionated: 0.4 IU/mL (ref 0.30–0.70)

## 2017-11-26 LAB — ECHOCARDIOGRAM COMPLETE
Height: 71 in
WEIGHTICAEL: 3456.81 [oz_av]

## 2017-11-26 LAB — POCT ACTIVATED CLOTTING TIME: ACTIVATED CLOTTING TIME: 125 s

## 2017-11-26 SURGERY — LEFT HEART CATH AND CORS/GRAFTS ANGIOGRAPHY
Anesthesia: LOCAL

## 2017-11-26 MED ORDER — MIDAZOLAM HCL 2 MG/2ML IJ SOLN
INTRAMUSCULAR | Status: AC
  Filled 2017-11-26: qty 2

## 2017-11-26 MED ORDER — IOPAMIDOL (ISOVUE-370) INJECTION 76%
INTRAVENOUS | Status: AC
  Filled 2017-11-26: qty 125

## 2017-11-26 MED ORDER — HEPARIN (PORCINE) IN NACL 2-0.9 UNITS/ML
INTRAMUSCULAR | Status: AC | PRN
  Administered 2017-11-26 (×2): 500 mL

## 2017-11-26 MED ORDER — SODIUM CHLORIDE 0.9 % IV SOLN: 250.0000 mL | INTRAVENOUS | Status: DC | PRN

## 2017-11-26 MED ORDER — FENTANYL CITRATE (PF) 100 MCG/2ML IJ SOLN
INTRAMUSCULAR | Status: DC | PRN
  Administered 2017-11-26 (×2): 25 ug via INTRAVENOUS

## 2017-11-26 MED ORDER — ALBUTEROL SULFATE (2.5 MG/3ML) 0.083% IN NEBU: 3.0000 mL | INHALATION_SOLUTION | Freq: Four times a day (QID) | RESPIRATORY_TRACT | Status: DC | PRN

## 2017-11-26 MED ORDER — SODIUM CHLORIDE 0.9% FLUSH
3.0000 mL | Freq: Two times a day (BID) | INTRAVENOUS | Status: DC
  Administered 2017-11-26 – 2017-11-27 (×2): 3 mL via INTRAVENOUS

## 2017-11-26 MED ORDER — LIDOCAINE HCL (PF) 1 % IJ SOLN
INTRAMUSCULAR | Status: DC | PRN
  Administered 2017-11-26: 15 mL

## 2017-11-26 MED ORDER — IOPAMIDOL (ISOVUE-370) INJECTION 76%
INTRAVENOUS | Status: DC | PRN
  Administered 2017-11-26: 110 mL via INTRAVENOUS

## 2017-11-26 MED ORDER — FENTANYL CITRATE (PF) 100 MCG/2ML IJ SOLN
INTRAMUSCULAR | Status: AC
  Filled 2017-11-26: qty 2

## 2017-11-26 MED ORDER — SODIUM CHLORIDE 0.9 % IV SOLN
250.0000 mL | INTRAVENOUS | Status: DC | PRN
  Administered 2017-11-26: 100 mL via INTRAVENOUS

## 2017-11-26 MED ORDER — SODIUM CHLORIDE 0.9 % WEIGHT BASED INFUSION
1.0000 mL/kg/h | INTRAVENOUS | Status: AC
  Administered 2017-11-26: 1 mL/kg/h via INTRAVENOUS

## 2017-11-26 MED ORDER — MIDAZOLAM HCL 2 MG/2ML IJ SOLN
INTRAMUSCULAR | Status: DC | PRN
  Administered 2017-11-26: 1 mg via INTRAVENOUS

## 2017-11-26 MED ORDER — LIDOCAINE HCL (PF) 1 % IJ SOLN
INTRAMUSCULAR | Status: AC
  Filled 2017-11-26: qty 30

## 2017-11-26 MED ORDER — SODIUM CHLORIDE 0.9 % WEIGHT BASED INFUSION: 1.0000 mL/kg/h | INTRAVENOUS | Status: DC

## 2017-11-26 MED ORDER — MORPHINE SULFATE (PF) 2 MG/ML IV SOLN
1.0000 mg | Freq: Once | INTRAVENOUS | Status: AC
  Administered 2017-11-26: 1 mg via INTRAVENOUS
  Filled 2017-11-26: qty 1

## 2017-11-26 MED ORDER — HEPARIN (PORCINE) IN NACL 1000-0.9 UT/500ML-% IV SOLN
INTRAVENOUS | Status: AC
  Filled 2017-11-26: qty 1000

## 2017-11-26 MED ORDER — SODIUM CHLORIDE 0.9% FLUSH
3.0000 mL | Freq: Two times a day (BID) | INTRAVENOUS | Status: DC
  Administered 2017-11-26: 3 mL via INTRAVENOUS

## 2017-11-26 MED ORDER — ASPIRIN 81 MG PO CHEW
81.0000 mg | CHEWABLE_TABLET | ORAL | Status: AC
  Administered 2017-11-26: 81 mg via ORAL
  Filled 2017-11-26: qty 1

## 2017-11-26 MED ORDER — SODIUM CHLORIDE 0.9 % WEIGHT BASED INFUSION: 3.0000 mL/kg/h | INTRAVENOUS | Status: DC

## 2017-11-26 MED ORDER — MORPHINE SULFATE (PF) 2 MG/ML IV SOLN
1.0000 mg | Freq: Four times a day (QID) | INTRAVENOUS | Status: DC | PRN
  Administered 2017-11-26 – 2017-11-27 (×3): 1 mg via INTRAVENOUS
  Filled 2017-11-26 (×3): qty 1

## 2017-11-26 MED ORDER — SODIUM CHLORIDE 0.9% FLUSH: 3.0000 mL | INTRAVENOUS | Status: DC | PRN

## 2017-11-26 MED ORDER — NITROGLYCERIN IN D5W 200-5 MCG/ML-% IV SOLN
0.0000 ug/min | INTRAVENOUS | Status: DC
  Administered 2017-11-26: 5 ug/min via INTRAVENOUS
  Filled 2017-11-26: qty 250

## 2017-11-26 SURGICAL SUPPLY — 11 items
CATH INFINITI 5FR ANG PIGTAIL (CATHETERS) ×2 IMPLANT
CATH INFINITI 5FR MULTPACK ANG (CATHETERS) ×2 IMPLANT
COVER PRB 48X5XTLSCP FOLD TPE (BAG) ×1 IMPLANT
COVER PROBE 5X48 (BAG) ×2
KIT HEART LEFT (KITS) ×2 IMPLANT
PACK CARDIAC CATHETERIZATION (CUSTOM PROCEDURE TRAY) ×2 IMPLANT
SHEATH AVANTI 11CM 5FR (SHEATH) ×2 IMPLANT
SYR MEDRAD MARK V 150ML (SYRINGE) ×2 IMPLANT
TRANSDUCER W/STOPCOCK (MISCELLANEOUS) ×2 IMPLANT
TUBING CIL FLEX 10 FLL-RA (TUBING) ×2 IMPLANT
WIRE EMERALD 3MM-J .035X150CM (WIRE) ×2 IMPLANT

## 2017-11-26 NOTE — Progress Notes (Signed)
Pt educated and understands that he is on bedrest until 17:30. Femoral site level 0; foot pulses 2+; morphine given for pain at femoral site; pt states no chest pain; will continue to monitor.   [place]  Rosezetta Balderston, RN

## 2017-11-26 NOTE — H&P (View-Only) (Signed)
Progress Note  Patient Name: [name] Date of Encounter: 11/26/2017  Primary Cardiologist: Larae Grooms, MD   Subjective   Still with chest pain 3/10,  No SOB able to lie flat, still smokes and does have cough that began 3 days ago.  Inpatient Medications    Scheduled Meds: . aspirin  324 mg Oral NOW   Or  . aspirin  300 mg Rectal NOW  . aspirin EC  81 mg Oral Daily  . clopidogrel  75 mg Oral Daily  . ezetimibe  10 mg Oral Daily  . isosorbide mononitrate  30 mg Oral Daily  . metoprolol tartrate  25 mg Oral BID  . nicotine  14 mg Transdermal Daily   Continuous Infusions: . heparin 1,200 Units/hr (11/26/17 0033)   PRN Meds: acetaminophen, albuterol, morphine injection, nitroGLYCERIN, ondansetron (ZOFRAN) IV   Vital Signs    Vitals:   11/26/17 0100 11/26/17 0256 11/26/17 0300 11/26/17 0400  BP: 130/71 138/67 112/63 (!) 106/47  Pulse: 63 63 (!) 59 62  Resp: 14 12 10 12   Temp:  98.3 F (36.8 C)    TempSrc:  Oral    SpO2: 97% 99% 99% 98%  Weight:   216 lb 0.8 oz (98 kg)   Height:        Intake/Output Summary (Last 24 hours) at 11/26/2017 0831 Last data filed at 11/26/2017 0600 Gross per 24 hour  Intake 305.4 ml  Output 1600 ml  Net -1294.6 ml   Filed Weights   11/26/17 0005 11/26/17 0300  Weight: 216 lb 0.8 oz (98 kg) 216 lb 0.8 oz (98 kg)    Telemetry    SR- Personally Reviewed  ECG    No new since admit - Personally Reviewed  Physical Exam   GEN: No acute distress. But does continue with pain  Neck: No JVD Cardiac: RRR, no murmurs, rubs, or gallops.  Respiratory: Clear to diminished to auscultation bilaterally. occ wheeze, no rales GI: Soft, nontender, non-distended  MS: No edema; No deformity. Neuro:  Nonfocal  Psych: Normal affect   Labs    Chemistry Recent Labs  Lab 11/25/17 2243 11/26/17 0350  NA 137 138  K 4.1 4.1  CL 104 107  CO2 23 24  GLUCOSE 92 95  BUN 11 9  CREATININE 1.02 1.03  CALCIUM 8.4* 8.7*  PROT 6.2*  --     ALBUMIN 3.3*  --   AST 17  --   ALT 16*  --   ALKPHOS 82  --   BILITOT 0.5  --   GFRNONAA >60 >60  GFRAA >60 >60  ANIONGAP 10 7     Hematology Recent Labs  Lab 11/26/17 0350  WBC 7.5  RBC 3.90*  HGB 9.4*  HCT 31.0*  MCV 79.5  MCH 24.1*  MCHC 30.3  RDW 15.5  PLT 327    Cardiac Enzymes Recent Labs  Lab 11/25/17 2243 11/26/17 0350  TROPONINI <0.03 <0.03   No results for input(s): TROPIPOC in the last 168 hours.   BNP Recent Labs  Lab 11/25/17 2243  BNP 58.0     DDimer No results for input(s): DDIMER in the last 168 hours.   Lipid Panel     Component Value Date/Time   CHOL 151 01/29/2017 0426   TRIG 83 01/29/2017 0426   HDL 37 (L) 01/29/2017 0426   CHOLHDL 4.1 01/29/2017 0426   VLDL 17 01/29/2017 0426   LDLCALC 97 01/29/2017 0426   Radiology  No results found.  Cardiac Studies   Coronary artery disease     a. 2008 s/p CABG x 3 [facility]): LIMA->OM, RIMA->Diag, VG->RPDA;  b. 05/2016 NSTEMI/PCI: DES ->prox LAD and ost diag;  c. 12/2016 Cath/PCI: LM 95d, LAD 95ost (2.75x8 Promus Premier DES), patent prox stent, D2 30, LCX nl, OM2 95, RCA min irregs, LIMA->OM2 ok, RIMA->D2 85 "kink"-improved w/ IC NTG, VG->RPDA 100.       Conclusion 12/28/16   1. Severe ostial LAD stenosis, unprotected LAD 2. Successful atherectomy, PTCA/DES x 1 ostial LAD     Cardiac cath 12/26/16   Diagnostic Diagram           Patient Profile     67 y.o. male history ofCAD (s/p CABG x 3 in 2008, PCI as above), prostate CA, GERD, HTN, HLD, moderate MR by cath 12/2016, statin intolerance, tobacco abuse, cerebrovascular disease by imaging 02/2017, syncope 7/2018now admitted from [facility] 11/25/17 with angina and abnormal EKG.  Assessment & Plan    Unstable angina --with neg troponin, on IV heparin, NTG paste, BB and plavix. Began last Wed and would come and go.  Now continues to be present the NTG paste did help.  Will add IV NTG drip.  Cath Lt wrist.   Abnormal  EKG with subtle inf wall up sloping  0.5-24mm elevtiosn /[place] [facility] ekg here EKG improved with IV heparin  Moderate MR- 3+ on cath last year  Inferior wall hypokinesis on Echo at [facility]  CAD with hx CABG and PCI in 2018   HTN controlled   HLD on zetia but had stopped statin due to severe myalgia   R>L leg pain ultrasound venous and arterial is ordered.   For questions or updates, please contact [place] Please consult [email or web address] for contact info under Cardiology/STEMI.      Signed, Cecilie Kicks, NP  11/26/2017, 8:31 AM    Patient seen and examined. Agree with assessment and plan. Pt admitted in transfer l;ast night with unstable angina.  He is s/p CABG x3 in 2008 and in 12/2016 underwent cath and PCI/aterectomy/DES stent to LAD ostium as shown above. Unfortunately he has continued to smoke, currently 6 - 10 cigarettes a day.  He has developed recurrent chest pain since last Wednesday leading to [facility] hospitalization and transfer last night to [facility]. He admits to residual discomfort this am. Will start iv NTG drip. I have discussed cath with patient and will schedule for today. He is aware of risks/benefits. Also for vascular US already scheduled for leg discomfort to assess for PVD/leg pain. ECG without acute ST changes but subtle inferior early repolarization/ STE changes. Troponin negative. Anemic with H/H 9.4/31. On zetia for HLD; consider Repatha if unable to take statin with target LDL < 70.  Troy Sine, MD, [facility] 11/26/2017 9:32 AM

## 2017-11-26 NOTE — Progress Notes (Signed)
Site area: Right groin a 5 french arterial sheath was removed  Site Prior to Removal:  Level 0  Pressure Applied For 20 MINUTES    Bedrest Beginning at 1340p  Manual:   Yes.    Patient Status During Pull:  stable  Post Pull Groin Site:  Level 0  Post Pull Instructions Given:  Yes.    Post Pull Pulses Present:  Yes.    Dressing Applied:  Yes.    Comments:  VS remain stable

## 2017-11-26 NOTE — Progress Notes (Signed)
ANTICOAGULATION CONSULT NOTE - Initial Consult  Pharmacy Consult for heparin Indication: chest pain/ACS  Allergies  Allergen Reactions  . Ciprofloxacin Hcl Nausea Only    Wife reported  . Codeine Other (See Comments)    Other reaction(s): GI Upset (intolerance) Pt not sure  . Hydrocodone-Acetaminophen Other (See Comments)    Other reaction(s): GI Upset (intolerance)  . Lisinopril     Wife reported  . Meclizine     Wife reported  . Metronidazole Nausea Only    Wife reported  . Pantoprazole     Wife reported  . Ranitidine     Wife reported  . Simvastatin Other (See Comments)    Other reaction(s): GI Upset (intolerance)  . Influenza Vaccines Nausea And Vomiting    Patient Measurements: Height: 5\' 11"  (180.3 cm) Weight: 216 lb 0.8 oz (98 kg) IBW/kg (Calculated) : 75.3 Heparin Dosing Weight: 95.3 kg  Vital Signs: Temp: 98.3 F (36.8 C) (04/23 0256) Temp Source: Oral (04/23 0256) BP: 106/47 (04/23 0400) Pulse Rate: 62 (04/23 0400)  Labs: Recent Labs    11/25/17 2243 11/26/17 0350  HGB  --  9.4*  HCT  --  31.0*  PLT  --  327  LABPROT 13.4  --   INR 1.03  --   HEPARINUNFRC 0.46 0.40  CREATININE 1.02 1.03  TROPONINI <0.03 <0.03    Estimated Creatinine Clearance: 83.1 mL/min (by C-G formula based on SCr of 1.03 mg/dL).   Assessment: 67 yo man transferred from OSH on heparin drip at 1200 units/hr. Heparin level therapeutic this AM  Goal of Therapy:  Heparin level 0.3-0.7 units/ml Monitor platelets by anticoagulation protocol: Yes   Plan:  Continue heparin at 1200 units/hr Daily HL and CBC while on heparin Follow up plans for cath  Thank you Anette Guarneri, PharmD [phone]  11/26/2017,8:47 AM

## 2017-11-26 NOTE — Progress Notes (Signed)
ANTICOAGULATION CONSULT NOTE - Initial Consult  Pharmacy Consult for heparin Indication: chest pain/ACS  Allergies  Allergen Reactions  . Ciprofloxacin Hcl Nausea Only    Wife reported  . Codeine Other (See Comments)    Other reaction(s): GI Upset (intolerance) Pt not sure  . Hydrocodone-Acetaminophen Other (See Comments)    Other reaction(s): GI Upset (intolerance)  . Lisinopril     Wife reported  . Meclizine     Wife reported  . Metronidazole Nausea Only    Wife reported  . Pantoprazole     Wife reported  . Ranitidine     Wife reported  . Simvastatin Other (See Comments)    Other reaction(s): GI Upset (intolerance)  . Influenza Vaccines Nausea And Vomiting    Patient Measurements: Height: 5\' 11"  (180.3 cm) Weight: 216 lb 0.8 oz (98 kg) IBW/kg (Calculated) : 75.3 Heparin Dosing Weight: 95.3 kg  Vital Signs: Temp: 98.6 F (37 C) (04/23 0005) Temp Source: Oral (04/23 0005) BP: 143/61 (04/23 0005) Pulse Rate: 69 (04/23 0005)  Labs: Recent Labs    11/25/17 2243  LABPROT 13.4  INR 1.03  HEPARINUNFRC 0.46  CREATININE 1.02  TROPONINI <0.03    Estimated Creatinine Clearance: 83.9 mL/min (by C-G formula based on SCr of 1.02 mg/dL).   Medical History: Past Medical History:  Diagnosis Date  . Arthritis   . Asthma   . Cancer [facility])    history of prostate cancer- cryoablation  . Cerebrovascular disease   . COPD (chronic obstructive pulmonary disease) ([place])   . Coronary artery disease    a. 2008 s/p CABG x 3 [facility]): LIMA->OM, RIMA->Diag, VG->RPDA;  b. 05/2016 NSTEMI/PCI: DES ->prox LAD and ost diag;  c. 12/2016 Cath/PCI: LM 95d, LAD 95ost (2.75x8 Promus Premier DES), patent prox stent, D2 30, LCX nl, OM2 95, RCA min irregs, LIMA->OM2 ok, RIMA->D2 85 "kink"-improved w/ IC NTG, VG->RPDA 100.  Marland Kitchen Dyspnea   . GERD (gastroesophageal reflux disease)   . Hyperlipidemia   . Hypertension   . Mitral regurgitation    a. mod by cath 12/2016.  . Statin intolerance    . Stroke ([place])   . Syncope 02/2017  . Tobacco abuse     Medications:  Medications Prior to Admission  Medication Sig Dispense Refill Last Dose  . aspirin 81 MG tablet Take 1 tablet (81 mg total) by mouth daily.   Taking  . clopidogrel (PLAVIX) 75 MG tablet Take 75 mg by mouth daily.   Taking  . ezetimibe (ZETIA) 10 MG tablet TAKE 1 TABLET BY MOUTH ONCE (1) DAILY 30 tablet 6   . hydrochlorothiazide (MICROZIDE) 12.5 MG capsule Take 12.5 mg by mouth daily.   Not Taking  . isosorbide mononitrate (IMDUR) 30 MG 24 hr tablet Take 1 tablet (30 mg total) by mouth daily. KEEP OV. 90 tablet 0   . meclizine (ANTIVERT) 25 MG tablet Take 1 tablet (25 mg total) by mouth 3 (three) times daily as needed for dizziness. 30 tablet 0 Taking  . metoprolol tartrate (LOPRESSOR) 25 MG tablet TAKE 1 TABLET BY MOUTH TWICE (2) DAILY 90 tablet 3   . nicotine (NICODERM CQ - DOSED IN MG/24 HOURS) 14 mg/24hr patch Place 1 patch (14 mg total) onto the skin daily. 30 patch 0 Taking  . nitroGLYCERIN (NITROSTAT) 0.4 MG SL tablet Place 1 tablet (0.4 mg total) under the tongue as needed for chest pain. X 3 doses 25 tablet 3 Taking    Assessment: 67 yo man transferred from  OSH on heparin drip at 1200 units/hr.  Initial heparin level is therapeutic 0.46 units/ml Goal of Therapy:  Heparin level 0.3-0.7 units/ml Monitor platelets by anticoagulation protocol: Yes   Plan:  Continue heparin at 1200 units/hr Daily HL and CBC while on heparin Monitior for bleeding complications  [name] 11/26/2017,12:25 AM

## 2017-11-26 NOTE — Interval H&P Note (Signed)
History and Physical Interval Note:  11/26/2017 12:16 PM  [name]  has presented today for surgery, with the diagnosis of cp  The various methods of treatment have been discussed with the patient and family. After consideration of risks, benefits and other options for treatment, the patient has consented to  Procedure(s): LEFT HEART CATH AND CORS/GRAFTS ANGIOGRAPHY (N/A) as a surgical intervention .  The patient's history has been reviewed, patient examined, no change in status, stable for surgery.  I have reviewed the patient's chart and labs.  Questions were answered to the patient's satisfaction.   Cath Lab Visit (complete for each Cath Lab visit)  Clinical Evaluation Leading to the Procedure:   ACS: Yes.    Non-ACS:    Anginal Classification: CCS IV  Anti-ischemic medical therapy: Maximal Therapy (2 or more classes of medications)  Non-Invasive Test Results: No non-invasive testing performed  Prior CABG: Previous CABG        Collier [facility] 11/26/2017 12:16 PM

## 2017-11-26 NOTE — Progress Notes (Signed)
  Echocardiogram 2D Echocardiogram has been performed.  [name] L Androw 11/26/2017, 11:22 AM

## 2017-11-26 NOTE — Progress Notes (Addendum)
Progress Note  Patient Name: [name] Date of Encounter: 11/26/2017  Primary Cardiologist: Larae Grooms, MD   Subjective   Still with chest pain 3/10,  No SOB able to lie flat, still smokes and does have cough that began 3 days ago.  Inpatient Medications    Scheduled Meds: . aspirin  324 mg Oral NOW   Or  . aspirin  300 mg Rectal NOW  . aspirin EC  81 mg Oral Daily  . clopidogrel  75 mg Oral Daily  . ezetimibe  10 mg Oral Daily  . isosorbide mononitrate  30 mg Oral Daily  . metoprolol tartrate  25 mg Oral BID  . nicotine  14 mg Transdermal Daily   Continuous Infusions: . heparin 1,200 Units/hr (11/26/17 0033)   PRN Meds: acetaminophen, albuterol, morphine injection, nitroGLYCERIN, ondansetron (ZOFRAN) IV   Vital Signs    Vitals:   11/26/17 0100 11/26/17 0256 11/26/17 0300 11/26/17 0400  BP: 130/71 138/67 112/63 (!) 106/47  Pulse: 63 63 (!) 59 62  Resp: 14 12 10 12   Temp:  98.3 F (36.8 C)    TempSrc:  Oral    SpO2: 97% 99% 99% 98%  Weight:   216 lb 0.8 oz (98 kg)   Height:        Intake/Output Summary (Last 24 hours) at 11/26/2017 0831 Last data filed at 11/26/2017 0600 Gross per 24 hour  Intake 305.4 ml  Output 1600 ml  Net -1294.6 ml   Filed Weights   11/26/17 0005 11/26/17 0300  Weight: 216 lb 0.8 oz (98 kg) 216 lb 0.8 oz (98 kg)    Telemetry    SR- Personally Reviewed  ECG    No new since admit - Personally Reviewed  Physical Exam   GEN: No acute distress. But does continue with pain  Neck: No JVD Cardiac: RRR, no murmurs, rubs, or gallops.  Respiratory: Clear to diminished to auscultation bilaterally. occ wheeze, no rales GI: Soft, nontender, non-distended  MS: No edema; No deformity. Neuro:  Nonfocal  Psych: Normal affect   Labs    Chemistry Recent Labs  Lab 11/25/17 2243 11/26/17 0350  NA 137 138  K 4.1 4.1  CL 104 107  CO2 23 24  GLUCOSE 92 95  BUN 11 9  CREATININE 1.02 1.03  CALCIUM 8.4* 8.7*  PROT 6.2*  --     ALBUMIN 3.3*  --   AST 17  --   ALT 16*  --   ALKPHOS 82  --   BILITOT 0.5  --   GFRNONAA >60 >60  GFRAA >60 >60  ANIONGAP 10 7     Hematology Recent Labs  Lab 11/26/17 0350  WBC 7.5  RBC 3.90*  HGB 9.4*  HCT 31.0*  MCV 79.5  MCH 24.1*  MCHC 30.3  RDW 15.5  PLT 327    Cardiac Enzymes Recent Labs  Lab 11/25/17 2243 11/26/17 0350  TROPONINI <0.03 <0.03   No results for input(s): TROPIPOC in the last 168 hours.   BNP Recent Labs  Lab 11/25/17 2243  BNP 58.0     DDimer No results for input(s): DDIMER in the last 168 hours.   Lipid Panel     Component Value Date/Time   CHOL 151 01/29/2017 0426   TRIG 83 01/29/2017 0426   HDL 37 (L) 01/29/2017 0426   CHOLHDL 4.1 01/29/2017 0426   VLDL 17 01/29/2017 0426   LDLCALC 97 01/29/2017 0426   Radiology  No results found.  Cardiac Studies   Coronary artery disease     a. 2008 s/p CABG x 3 [facility]): LIMA->OM, RIMA->Diag, VG->RPDA;  b. 05/2016 NSTEMI/PCI: DES ->prox LAD and ost diag;  c. 12/2016 Cath/PCI: LM 95d, LAD 95ost (2.75x8 Promus Premier DES), patent prox stent, D2 30, LCX nl, OM2 95, RCA min irregs, LIMA->OM2 ok, RIMA->D2 85 "kink"-improved w/ IC NTG, VG->RPDA 100.       Conclusion 12/28/16   1. Severe ostial LAD stenosis, unprotected LAD 2. Successful atherectomy, PTCA/DES x 1 ostial LAD     Cardiac cath 12/26/16   Diagnostic Diagram           Patient Profile     67 y.o. male history ofCAD (s/p CABG x 3 in 2008, PCI as above), prostate CA, GERD, HTN, HLD, moderate MR by cath 12/2016, statin intolerance, tobacco abuse, cerebrovascular disease by imaging 02/2017, syncope 7/2018now admitted from [facility] 11/25/17 with angina and abnormal EKG.  Assessment & Plan    Unstable angina --with neg troponin, on IV heparin, NTG paste, BB and plavix. Began last Wed and would come and go.  Now continues to be present the NTG paste did help.  Will add IV NTG drip.  Cath Lt wrist.   Abnormal  EKG with subtle inf wall up sloping  0.5-75mm elevtiosn /[facility] ekg here EKG improved with IV heparin  Moderate MR- 3+ on cath last year  Inferior wall hypokinesis on Echo at [facility]  CAD with hx CABG and PCI in 2018   HTN controlled   HLD on zetia but had stopped statin due to severe myalgia   R>L leg pain ultrasound venous and arterial is ordered.   For questions or updates, please contact [place] Please consult [email or web address] for contact info under Cardiology/STEMI.      Signed, Cecilie Kicks, NP  11/26/2017, 8:31 AM    Patient seen and examined. Agree with assessment and plan. Pt admitted in transfer l;ast night with unstable angina.  He is s/p CABG x3 in 2008 and in 12/2016 underwent cath and PCI/aterectomy/DES stent to LAD ostium as shown above. Unfortunately he has continued to smoke, currently 6 - 10 cigarettes a day.  He has developed recurrent chest pain since last Wednesday leading to [facility] hospitalization and transfer last night to [facility]. He admits to residual discomfort this am. Will start iv NTG drip. I have discussed cath with patient and will schedule for today. He is aware of risks/benefits. Also for vascular US already scheduled for leg discomfort to assess for PVD/leg pain. ECG without acute ST changes but subtle inferior early repolarization/ STE changes. Troponin negative. Anemic with H/H 9.4/31. On zetia for HLD; consider Repatha if unable to take statin with target LDL < 70.  Troy Sine, MD, [facility] 11/26/2017 9:32 AM

## 2017-11-26 NOTE — Progress Notes (Signed)
*  Preliminary Results* Bilateral lower extremity venous duplex completed. Bilateral lower extremities are negative for deep vein thrombosis. There is no evidence of Baker's cyst bilaterally.   ABI completed:   RIGHT    LEFT    PRESSURE WAVEFORM  PRESSURE WAVEFORM  BRACHIAL 148 Triphasic BRACHIAL 122 Triphasic  DP 130 Triphasic DP 124 Triphasic  AT   AT    PT 149 Triphasic PT 152 Triphasic  PER   PER    GREAT TOE 118 NA GREAT TOE 131 NA    RIGHT LEFT  ABI 1.01 1.03  TBI 0.80 0.89    Bilateral ABIs and TBIs are within normal limits at rest.  11/26/2017 10:21 AM Maudry Mayhew, BS, RVT, RDCS, RDMS

## 2017-11-27 DIAGNOSIS — Z716 Tobacco abuse counseling: Secondary | ICD-10-CM

## 2017-11-27 MED ORDER — ATORVASTATIN CALCIUM 40 MG PO TABS: 40.0000 mg | ORAL_TABLET | Freq: Every day | ORAL | Status: DC

## 2017-11-27 MED ORDER — ATORVASTATIN CALCIUM 40 MG PO TABS: 40.0000 mg | ORAL_TABLET | Freq: Every day | ORAL | 5 refills | Status: DC

## 2017-11-27 MED ORDER — MECLIZINE HCL 25 MG PO TABS: 25.0000 mg | ORAL_TABLET | Freq: Three times a day (TID) | ORAL | 0 refills | Status: DC | PRN

## 2017-11-27 MED FILL — Heparin Sod (Porcine)-NaCl IV Soln 1000 Unit/500ML-0.9%: INTRAVENOUS | Qty: 1000 | Status: AC

## 2017-11-27 MED FILL — Heparin Sod (Porcine)-NaCl IV Soln 1000 Unit/500ML-0.9%: INTRAVENOUS | Qty: 1000 | Status: CN

## 2017-11-27 NOTE — Progress Notes (Signed)
Discharge instructions reviewed w/patient and wife; all questions answered.  Safety concerns reviewed w/them again, encouraging use of assistive devices, standing slowly, increased awareness of environment.  This Probation officer also expressed concerns about patient driving w/dizziness - patient states "there's no problem when he drives".  Non-compliance w/safety measures expected.  Wife is aware and has spoken w/physicians, PT, and cardiac rehab.  Patient and all belongings taken out per wheelchair and NT.

## 2017-11-27 NOTE — Consult Note (Signed)
            [facility] CM Primary Care Navigator  11/27/2017  [name] 01-01-1951 [id number]   Went to seepatient at the bedside to identify possible discharge needsbuthe was already dischargedhomeperstaffreport. Per chart review, therapy recommends home health PT but patient declined any follow-up PT services.  Per MD note, patient was seen for chest pain and admitted for unstable angina (status post cardiac catheterization).  Primary care provider's officeis listed as providingtransition of care (TOC)follow-up.   Patient has discharge instruction to follow-up with cardiology on 12/10/17.    For additional questions please contact:  Edwena Felty A. Ricahrd Schwager, BSN, RN-BC [facility] PRIMARY CARE Navigator Cell: [phone]

## 2017-11-27 NOTE — Progress Notes (Addendum)
1100-1140 Cardiac Rehab On arrival pt in recliner. Read note from the RN this morning that pt is very unsteady. Completed angina discharge education with pt and wife. We discussed risk factors, modifications, heart healthy diet, walking as tolerated, proper use of sl NTG, calling 911 and Outpt. CRP. Pt states that he has cut down on smoking but he has not been able to stop. I strongly encouraged him to try again. He states that he did not attend the Outpt. CRP in [employer] last year his stent because he is not home. 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 I don't expect any compliance with anything but his medications. He states that his wife makes sure that he takes those. Physical Therapy came to ambulate pt.

## 2017-11-27 NOTE — Evaluation (Signed)
Physical Therapy Evaluation Patient Details Name: [name] MRN: [id number] DOB: 1951-02-02 Today's Date: 11/27/2017   History of Present Illness  Pt is a 67 y.o. male admitted on 11/25/17 from [facility] for angina and abnormal EKG. S/p L heart cath on 4/23. Negative for BLE DVT. Of note, CT angio neck in 02/2017 showed moderate to advanced diffuse disease with severe stenosis in bilat vertebral arteries. PMH includes CAD (s/p CABGx3 in 2008), prostate CA, HTN, tobacco abuse, cerebrovascular disease (by imaging 02/2017), syncope.     Clinical Impression  Pt presents with an overall decrease in functional mobility secondary to above. 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 Today, pt attempted standing 4x, all with LOB and uncontrolled descent back to recliner; required max encouragement to use RW for UE support. Able to amb 17' with RW and close min guard for balance; 4x LOB requiring maxA to prevent falls. Pt very difficult to educate and reason with regarding significant risk for falls upon return home; unwilling to use any DME although there is a clear need. Pt with poor safety awareness and insight into deficits. Pt adamantly declining any follow-up PT despite max encouragement; reports this will not work with his work schedule. Do not expect any compliance from pt with this PT's recommendations. Wife present throughout session and receptive to education. If pt to remain admitted, will follow acutely.    Follow Up Recommendations Home health PT;Supervision/Assistance - 24 hour(declining any follow-up PT services)    Equipment Recommendations  None recommended by PT    Recommendations for Other Services       Precautions / Restrictions Precautions Precautions: Fall Restrictions Weight Bearing Restrictions: No      Mobility  Bed Mobility               General bed mobility comments: Received sitting in recliner  Transfers Overall  transfer level: Needs assistance Equipment used: None;Rolling walker (2 wheeled) Transfers: Sit to/from Stand           General transfer comment: Pt requiring no physical assist to stand, but performed 4x attempts all with uncontrolled descent back to chair. Stood again with RW and min guard for balance  Ambulation/Gait Ambulation/Gait assistance: Min guard;Max assist Ambulation Distance (Feet): 70 Feet Assistive device: Rolling walker (2 wheeled) Gait Pattern/deviations: Step-through pattern;Decreased stride length;Staggering right;Staggering left;Leaning posteriorly Gait velocity: Decreased Gait velocity interpretation: <1.8 ft/sec, indicate of risk for recurrent falls General Gait Details: Slow, uncontrolled amb with RW and close min guard for balance. 4x episodes of LOB (due to knee buckling and posterior lean) requring maxA to correct  Stairs            Wheelchair Mobility    Modified Rankin (Stroke Patients Only)       Balance Overall balance assessment: Needs assistance   Sitting balance-Leahy Scale: Good       Standing balance-Leahy Scale: Poor Standing balance comment: Reliant on UE support to static stand without LOB                             Pertinent Vitals/Pain Pain Assessment: Faces Faces Pain Scale: Hurts a little bit Pain Location: BLE (below knee) Pain Descriptors / Indicators: Sore;Guarding Pain Intervention(s): Monitored during session    Home Living Family/patient expects to be discharged to:: Private residence Living Arrangements: Spouse/significant other Available Help at Discharge: Family;Available 24 hours/day Type of Home: [place]  Access: Stairs to enter Entrance Stairs-Rails: Right Entrance Stairs-Number of Steps: 3 Home Layout: One level Home Equipment: Walker - 2 wheels;Cane - single point;Crutches;Grab bars - tub/shower;Shower seat;Wheelchair - manual      Prior Function Level of Independence: Independent          Comments: Pt indep with no DME; furniture/wall surfs at home. Continues to work driving trucks. Denies fall in past 3 months. Refuses to use any DME     Hand Dominance        Extremity/Trunk Assessment   Upper Extremity Assessment Upper Extremity Assessment: Overall WFL for tasks assessed    Lower Extremity Assessment Lower Extremity Assessment: RLE deficits/detail;LLE deficits/detail RLE Deficits / Details: Hip flex 5/5, knee ext/flex 4/5, ankle 5/5 LLE Deficits / Details: Hip flex 4/5, knee ext 4/5, knee flex 5/5, ankle 5/5       Communication   Communication: No difficulties  Cognition Arousal/Alertness: Awake/alert Behavior During Therapy: Impulsive Overall Cognitive Status: Impaired/Different from baseline Area of Impairment: Safety/judgement;Awareness                         Safety/Judgement: Decreased awareness of safety;Decreased awareness of deficits Awareness: Emergent   General Comments: Cognition apparently baseline (per wife). All education seems lost on patient (as if in one ear and out the other); not able to reason with pt on importance of fall prevention and DME use at home      General Comments General comments (skin integrity, edema, etc.): Wife present throughout session    Exercises     Assessment/Plan    PT Assessment Patient needs continued PT services  PT Problem List Decreased strength;Decreased activity tolerance;Decreased balance;Decreased mobility;Decreased cognition;Decreased knowledge of use of DME;Decreased safety awareness       PT Treatment Interventions Gait training;DME instruction;Stair training;Functional mobility training;Therapeutic activities;Therapeutic exercise;Balance training;Patient/family education    PT Goals (Current goals can be found in the Care Plan section)  Acute Rehab PT Goals Patient Stated Goal: Get back to work PT Goal Formulation: With patient Time For Goal Achievement:  12/11/17 Potential to Achieve Goals: Fair    Frequency Min 3X/week   Barriers to discharge        Co-evaluation               AM-PAC PT "6 Clicks" Daily Activity  Outcome Measure Difficulty turning over in bed (including adjusting bedclothes, sheets and blankets)?: None Difficulty moving from lying on back to sitting on the side of the bed? : None Difficulty sitting down on and standing up from a chair with arms (e.g., wheelchair, bedside commode, etc,.)?: A Little Help needed moving to and from a bed to chair (including a wheelchair)?: A Little Help needed walking in hospital room?: A Lot Help needed climbing 3-5 steps with a railing? : A Lot 6 Click Score: 18    End of Session Equipment Utilized During Treatment: Gait belt Activity Tolerance: Patient tolerated treatment well Patient left: in chair;with call bell/phone within reach;with family/visitor present Nurse Communication: Mobility status PT Visit Diagnosis: Other abnormalities of gait and mobility (R26.89);Difficulty in walking, not elsewhere classified (R26.2)    Time: 1132-1202 PT Time Calculation (min) (ACUTE ONLY): 30 min   Charges:   PT Evaluation $PT Eval Moderate Complexity: 1 Mod PT Treatments $Therapeutic Activity: 8-22 mins   PT G Codes:       Mabeline Caras, PT, DPT Acute Rehab Services  Pager: [place] 11/27/2017, 12:44 PM

## 2017-11-27 NOTE — Progress Notes (Addendum)
Progress Note  Patient Name: [name] Date of Encounter: 11/27/2017  Primary Cardiologist: Larae Grooms, MD   Subjective   Denies CP. No dyspnea. Right groin is stable w/o pain. He has unsteady gait, 2/2 cerebrovascular disease. He and his wife reports he has had unsteady gait since June 2018. He has had several falls at home. Seen by neurology. Home PT previously recommended however pt refused. He still continues to work, drives trucks. He is eager to return to work. Continues to smoke.   Inpatient Medications    Scheduled Meds: . aspirin EC  81 mg Oral Daily  . clopidogrel  75 mg Oral Daily  . ezetimibe  10 mg Oral Daily  . metoprolol tartrate  25 mg Oral BID  . nicotine  14 mg Transdermal Daily  . sodium chloride flush  3 mL Intravenous Q12H   Continuous Infusions: . sodium chloride     PRN Meds: sodium chloride, acetaminophen, albuterol, morphine injection, nitroGLYCERIN, ondansetron (ZOFRAN) IV, sodium chloride flush   Vital Signs    Vitals:   11/26/17 2200 11/26/17 2357 11/27/17 0315 11/27/17 0800  BP: 132/65 (!) 104/44 (!) 103/54   Pulse: 85 66 (!) 56   Resp: 19 11 12    Temp:  98.2 F (36.8 C) 98.2 F (36.8 C) 98 F (36.7 C)  TempSrc:  Oral Oral Axillary  SpO2: 99% 98% 98%   Weight:      Height:        Intake/Output Summary (Last 24 hours) at 11/27/2017 0916 Last data filed at 11/27/2017 0800 Gross per 24 hour  Intake 979.33 ml  Output 1050 ml  Net -70.67 ml   Filed Weights   11/26/17 0005 11/26/17 0300  Weight: 216 lb 0.8 oz (98 kg) 216 lb 0.8 oz (98 kg)    Telemetry    NSR - Personally Reviewed  ECG    NSR 66 bpm - Personally Reviewed  Physical Exam   GEN: No acute distress.   Neck: No JVD Cardiac: RRR, no murmurs, rubs, or gallops.  Respiratory: Clear to auscultation bilaterally. GI: Soft, nontender, non-distended  MS: No edema; No deformity. Neuro:  unsteady gait Psych: Normal affect   Labs    Chemistry Recent Labs  Lab  11/25/17 2243 11/26/17 0350  NA 137 138  K 4.1 4.1  CL 104 107  CO2 23 24  GLUCOSE 92 95  BUN 11 9  CREATININE 1.02 1.03  CALCIUM 8.4* 8.7*  PROT 6.2*  --   ALBUMIN 3.3*  --   AST 17  --   ALT 16*  --   ALKPHOS 82  --   BILITOT 0.5  --   GFRNONAA >60 >60  GFRAA >60 >60  ANIONGAP 10 7     Hematology Recent Labs  Lab 11/26/17 0350  WBC 7.5  RBC 3.90*  HGB 9.4*  HCT 31.0*  MCV 79.5  MCH 24.1*  MCHC 30.3  RDW 15.5  PLT 327    Cardiac Enzymes Recent Labs  Lab 11/25/17 2243 11/26/17 0350 11/26/17 1135  TROPONINI <0.03 <0.03 <0.03   No results for input(s): TROPIPOC in the last 168 hours.   BNP Recent Labs  Lab 11/25/17 2243  BNP 58.0     DDimer No results for input(s): DDIMER in the last 168 hours.   Radiology    No results found.  Cardiac Studies   LHC 11/27/17 Procedures   LEFT HEART CATH AND CORS/GRAFTS ANGIOGRAPHY  Conclusion     Non-stenotic  2nd Diag lesion previously treated.  Ost 2nd Diag to 2nd Diag lesion is 30% stenosed.  Ost 2nd Mrg lesion is 100% stenosed.  Acute Mrg lesion is 30% stenosed.  LIMA.  Origin lesion is 100% stenosed.  RIMA graft was visualized by angiography and is normal in caliber.  Ost LM lesion is 45% stenosed.  Mid Cx to Dist Cx lesion is 70% stenosed.  Previously placed Ost LAD to Prox LAD stent (unknown type) is widely patent.  The graft exhibits no disease.  Non-stenotic Mid Graft lesion.  The left ventricular systolic function is normal.  LV end diastolic pressure is normal.  The left ventricular ejection fraction is 55-65% by visual estimate.   1. 2 vessel obstructive CAD 2. Patent LIMA to the OM 3. Patent RIMA to the diagonal 4. Occluded SVG to the RCA 5. Good LV function 6. Normal LVEDP  Plan: recommend continued medical therapy. The stent at the ostium of the LAD is widely patent. There is a moderate stenosis in the distal LCx but this is a fairly small vessel and appears similar  to prior studies.  Coronary Diagrams   Diagnostic Diagram         2D Echo 11/26/17 Study Conclusions  - Left ventricle: The cavity size was mildly dilated. Wall   thickness was normal. Systolic function was normal. The estimated   ejection fraction was in the range of 50% to 55%. Wall motion was   normal; there were no regional wall motion abnormalities. Doppler   parameters are consistent with abnormal left ventricular   relaxation (grade 1 diastolic dysfunction). Doppler parameters   are consistent with high ventricular filling pressure. - Mitral valve: Calcified annulus. There was mild regurgitation.  Impressions:  - Normal LV systolic function; mild diastolic dysfunction; mild   LVE; mild MR.  Patient Profile     67 y.o. male history ofCAD (s/p CABG x 3 in 2008, PCI as above), prostate CA, GERD, HTN, HLD, moderate MR by cath 12/2016, statin intolerance, tobacco abuse, cerebrovascular disease by imaging 02/2017, syncope 7/2018now admitted from [facility]25/17 with angina and abnormal EKG.   Assessment & Plan    1. CAD/Unstable Angina: ruled out for MI with negative troponins. LHC yesterday showed 2V obstructive CAD, with patient LIMA-OM, patent RIMA-diag, occluded SVG-RCA.  The stent in the ostium of the LAD is widely patent. There is moderate stenosis in the distal Lcx but this is a fairy small vessel and appears similar to prior studies. LVEF and LVEDP normal. Continued medical management recommended. Continue ASA + Plavix, along with BB and Zetia. Recommend adding statin (see below). He was on Imdur 30 mg prior to admit but was not ordered. Systolic BP in the 409W this morning. Will resume.   2. HTN: controlled on current regimen.   3. HLD: Last lipid panel was 01/2017. LDL was elevated at 97 mg/dL. Goal LDL given CAD and cerebrovascular disease is < 70 mg/dL. He is currently not on a statin. He is on Zetia. Simvastin is listed in allergy list as intolerance. Recommend  adding low to moderate dose Lipitor to Zetia for further LDL reduction.    4. Mitral Regurgitation: mild by echo  5. Unsteady Gait: he is a fall risk. He and his wife reports he has had unsteady gait since June 2018. He has had several falls at home. Seen by neurology. Imaging 02/2017 showed severe stenosis in the vertebral arteries bilaterally. Home PT previously recommended however pt refused. His wife also notes  that he refuses to use assistive devices for ambulation. Will ask PT to assess and provide pt eduction to help minimize falls. I discussed with pt and his wife that I think he would benefit from home health PT, but he continues to be resistant to the idea.   6. Tobacco Abuse: current everyday smoker. We discussed the importance of smoking cessation.   For questions or updates, please contact [place] Please consult [email or web address] for contact info under Cardiology/STEMI.      Signed, Lyda Jester, PA-C  11/27/2017, 9:16 AM    Patient seen and examined. Agree with assessment and plan. No recurrent chest pain.  Cath reviewed with pt and wife.  Last cigarette was on Sunday. I have told patient that he has quit as of Easter Sunday, a new beginning!  Resume oral nitrates. He agree to try low dose statin in combination with Zetia. If unable to achieve LDL < 70; initiate PCSK9.  DC today; f/u with Dr. Irish Lack.   Troy Sine, MD, [facility] 11/27/2017 10:55 AM

## 2017-11-27 NOTE — Progress Notes (Addendum)
Patient assisted by this writer up to bedside chair.  Patient was very unsteady standing and ambulating three feet to chair.  Wife is present and states patient has been having this unsteady gait/decreased balance for approx one year and patient has experienced several falls.  Wife states this lack of balance started after patient was placed on Brilinta approx last July.  This has been reported to physicians but they have been told that [place] does not cause this reaction . She reports patient has blockage in vertebral arteries that doctors have told her were unable to be surgically corrected.  Patient continues to be unsteady in gait/balance.  This writer recommends PT eval prior to discharge.

## 2017-11-27 NOTE — Discharge Summary (Signed)
Discharge Summary    Patient ID: [name],  MRN: [id number], DOB/AGE: 10-23-1950 67 y.o.  Admit date: 11/25/2017 Discharge date: 11/27/2017  Primary Care Provider: [name] Primary Cardiologist: Larae Grooms, MD  Discharge Diagnoses    Active Problems:   Unstable angina [facility])   Allergies Allergies  Allergen Reactions  . Ciprofloxacin Hcl Nausea Only    Wife reported  . Codeine Other (See Comments)    Other reaction(s): GI Upset (intolerance) Pt not sure  . Hydrocodone-Acetaminophen Other (See Comments)    Other reaction(s): GI Upset (intolerance)  . Lisinopril     Wife reported  . Meclizine     Wife reported  . Metronidazole Nausea Only    Wife reported  . Pantoprazole     Wife reported  . Ranitidine     Wife reported  . Simvastatin Other (See Comments)    Other reaction(s): GI Upset (intolerance)  . Influenza Vaccines Nausea And Vomiting    Diagnostic Studies/Procedures    LHC 11/27/17 Procedures   LEFT HEART CATH AND CORS/GRAFTS ANGIOGRAPHY  Conclusion     Non-stenotic 2nd Diag lesion previously treated.  Ost 2nd Diag to 2nd Diag lesion is 30% stenosed.  Ost 2nd Mrg lesion is 100% stenosed.  Acute Mrg lesion is 30% stenosed.  LIMA.  Origin lesion is 100% stenosed.  RIMA graft was visualized by angiography and is normal in caliber.  Ost LM lesion is 45% stenosed.  Mid Cx to Dist Cx lesion is 70% stenosed.  Previously placed Ost LAD to Prox LAD stent (unknown type) is widely patent.  The graft exhibits no disease.  Non-stenotic Mid Graft lesion.  The left ventricular systolic function is normal.  LV end diastolic pressure is normal.  The left ventricular ejection fraction is 55-65% by visual estimate.  1. 2 vessel obstructive CAD 2. Patent LIMA to the OM 3. Patent RIMA to the diagonal 4. Occluded SVG to the RCA 5. Good LV function 6. Normal LVEDP  Plan: recommend continued medical therapy. The stent at the  ostium of the LAD is widely patent. There is a moderate stenosis in the distal LCx but this is a fairly small vessel and appears similar to prior studies.  Coronary Diagrams   Diagnostic Diagram          History of Present Illness     67 y.o.malehistory ofCAD (s/p CABG x 3 in 2008, PCI asabove), prostate CA, GERD, HTN, HLD, moderate MR by cath 12/2016, statin intolerance (failed simvastatin), chronic tobacco abuse, cerebrovascular disease by imaging 02/2017 (severe stenosis in the vertebral arteries bilaterally>>subsequent unsteady gait and refuses outpatient PT), h/o syncope 02/2017, admitted from [facility] 11/25/17 with angina and abnormal EKG.  Hospital Course     On arrival to [facility], he was CP free. Cardiac enzymes were cycled and negative, ruling out MI. LHC was performed on 11/26/17 and showed 2V obstructive CAD, with patient LIMA-OM, patent RIMA-diag, occluded SVG-RCA.  The stent in the ostium of the LAD is widely patent. There is moderate stenosis in the distal Lcx but this is a fairy small vessel and appears similar to prior studies. LVEF and LVEDP normal. Continued medical management recommended. He was continued ASA + Plavix, along with BB, LA nitrate and Zetia. Pt agreed to retry another statin. Lipitor was added to his regimen. If unable to tolerate, can later consider PCSK9 inhibitors. He was monitored overnight and had no post cath complications. His femoral cath access site remained stable  as well as vitals signs, renal function and H/H. He denies any recurrent chest pain. Other than is baesline gait abnormalities, he had no exertional symptoms with ambulation. Physical therapy consult was offered but pt declined. He was last seen and examined by Dr. Claiborne Billings, who determined he was stable for discharge home. Smoking cessation was encouraged. Post hospital f/u has been arranged.   Consultants: none    Discharge Vitals Blood pressure (!) 103/54, pulse (!) 56, temperature 98 F  (36.7 C), temperature source Axillary, resp. rate 12, height 5\' 11"  (1.803 m), weight 216 lb 0.8 oz (98 kg), SpO2 98 %.  Filed Weights   11/26/17 0005 11/26/17 0300  Weight: 216 lb 0.8 oz (98 kg) 216 lb 0.8 oz (98 kg)    Labs & Radiologic Studies    CBC Recent Labs    11/26/17 0350  WBC 7.5  HGB 9.4*  HCT 31.0*  MCV 79.5  PLT 350   Basic Metabolic Panel Recent Labs    11/25/17 2243 11/26/17 0350  NA 137 138  K 4.1 4.1  CL 104 107  CO2 23 24  GLUCOSE 92 95  BUN 11 9  CREATININE 1.02 1.03  CALCIUM 8.4* 8.7*   Liver Function Tests Recent Labs    11/25/17 2243  AST 17  ALT 16*  ALKPHOS 82  BILITOT 0.5  PROT 6.2*  ALBUMIN 3.3*   No results for input(s): LIPASE, AMYLASE in the last 72 hours. Cardiac Enzymes Recent Labs    11/25/17 2243 11/26/17 0350 11/26/17 1135  TROPONINI <0.03 <0.03 <0.03   BNP Invalid input(s): POCBNP D-Dimer No results for input(s): DDIMER in the last 72 hours. Hemoglobin A1C Recent Labs    11/25/17 2243  HGBA1C 5.5   Fasting Lipid Panel No results for input(s): CHOL, HDL, LDLCALC, TRIG, CHOLHDL, LDLDIRECT in the last 72 hours. Thyroid Function Tests No results for input(s): TSH, T4TOTAL, T3FREE, THYROIDAB in the last 72 hours.  Invalid input(s): FREET3 _____________  No results found. Disposition   Pt is being discharged home today in good condition.  Follow-up Plans & Appointments    Follow-up Information    Consuelo Pandy, PA-C Follow up on 12/10/2017.   Specialties:  Cardiology, Radiology Why:  1:30 PM, hospital follow-up with Dr. Hassell Done PA Contact information: [place] [address] [phone]          Discharge Instructions    Diet - low sodium heart healthy   Complete by:  As directed    Increase activity slowly   Complete by:  As directed       Discharge Medications   Allergies as of 11/27/2017      Reactions   Ciprofloxacin Hcl Nausea Only   Wife reported     Codeine Other (See Comments)   Other reaction(s): GI Upset (intolerance) Pt not sure   Hydrocodone-acetaminophen Other (See Comments)   Other reaction(s): GI Upset (intolerance)   Lisinopril    Wife reported   Meclizine    Wife reported   Metronidazole Nausea Only   Wife reported   Pantoprazole    Wife reported   Ranitidine    Wife reported   Simvastatin Other (See Comments)   Other reaction(s): GI Upset (intolerance)   Influenza Vaccines Nausea And Vomiting      Medication List    TAKE these medications   aspirin 81 MG tablet Take 1 tablet (81 mg total) by mouth daily.   atorvastatin 40 MG tablet  Commonly known as:  LIPITOR Take 1 tablet (40 mg total) by mouth daily at 6 PM.   clopidogrel 75 MG tablet Commonly known as:  PLAVIX Take 75 mg by mouth daily.   ezetimibe 10 MG tablet Commonly known as:  ZETIA TAKE 1 TABLET BY MOUTH ONCE (1) DAILY   hydrochlorothiazide 12.5 MG capsule Commonly known as:  MICROZIDE Take 12.5 mg by mouth daily.   isosorbide mononitrate 30 MG 24 hr tablet Commonly known as:  IMDUR Take 1 tablet (30 mg total) by mouth daily. KEEP OV.   losartan 50 MG tablet Commonly known as:  COZAAR Take 50 mg by mouth daily.   meclizine 25 MG tablet Commonly known as:  ANTIVERT Take 1 tablet (25 mg total) by mouth 3 (three) times daily as needed for dizziness.   metoprolol tartrate 25 MG tablet Commonly known as:  LOPRESSOR TAKE 1 TABLET BY MOUTH TWICE (2) DAILY   nicotine 14 mg/24hr patch Commonly known as:  NICODERM CQ - dosed in mg/24 hours Place 1 patch (14 mg total) onto the skin daily.   nitroGLYCERIN 0.4 MG SL tablet Commonly known as:  NITROSTAT Place 1 tablet (0.4 mg total) under the tongue as needed for chest pain. X 3 doses   SYMBICORT 80-4.5 MCG/ACT inhaler Generic drug:  budesonide-formoterol Inhale 2 puffs into the lungs 2 (two) times daily as needed for shortness of breath.        Aspirin prescribed at discharge?   Yes High Intensity Statin Prescribed? (Lipitor 40-80mg  or Crestor 20-40mg ): Yes Beta Blocker Prescribed? Yes For EF <40%, was ACEI/ARB Prescribed? Yes ADP Receptor Inhibitor Prescribed? (i.e. Plavix etc.-Includes Medically Managed Patients): Yes For EF <40%, Aldosterone Inhibitor Prescribed? No Was EF assessed during THIS hospitalization? Yes Was Cardiac Rehab II ordered? (Included Medically managed Patients): Yes   Outstanding Labs/Studies   FLP and HFTs in 6-8 weeks  Duration of Discharge Encounter   Greater than 30 minutes including physician time.  Signed, [place], PA-C 11/27/2017, 3:07 PM

## 2017-11-27 NOTE — Progress Notes (Signed)
PT and Cardiac Rehab have both been to evaluation patient; safety concerns noted however all agree that patient refuses assistive devices and/or follow-up with PT services as patient states refusal for such continually.  Wife is at bedside and aware.  Cardiology has also assessed patient and patient is to be discharged home.

## 2017-12-10 ENCOUNTER — Ambulatory Visit: Payer: Medicare Other | Admitting: Cardiology

## 2018-01-08 DIAGNOSIS — M79605 Pain in left leg: Secondary | ICD-10-CM | POA: Diagnosis not present

## 2018-01-08 DIAGNOSIS — M79604 Pain in right leg: Secondary | ICD-10-CM | POA: Diagnosis not present

## 2018-01-08 DIAGNOSIS — Z1331 Encounter for screening for depression: Secondary | ICD-10-CM | POA: Diagnosis not present

## 2018-01-08 DIAGNOSIS — I739 Peripheral vascular disease, unspecified: Secondary | ICD-10-CM | POA: Diagnosis not present

## 2018-01-20 ENCOUNTER — Telehealth: Payer: Self-pay | Admitting: Family Medicine

## 2018-01-20 NOTE — Telephone Encounter (Signed)
Received referral from [place] [facility] to see [name] for PAD.   This referral is a follow-up from a previous request that the patient canceled. Patient has not responded to the three voicemail's left on cell and home phone #'s.  Once the patient returns the message an appointment will be scheduled.

## 2018-01-29 DIAGNOSIS — I2510 Atherosclerotic heart disease of native coronary artery without angina pectoris: Secondary | ICD-10-CM | POA: Diagnosis not present

## 2018-01-29 DIAGNOSIS — I739 Peripheral vascular disease, unspecified: Secondary | ICD-10-CM | POA: Diagnosis not present

## 2018-01-29 DIAGNOSIS — Z79899 Other long term (current) drug therapy: Secondary | ICD-10-CM | POA: Diagnosis not present

## 2018-01-29 DIAGNOSIS — Z1331 Encounter for screening for depression: Secondary | ICD-10-CM | POA: Diagnosis not present

## 2018-01-29 DIAGNOSIS — Z6831 Body mass index (BMI) 31.0-31.9, adult: Secondary | ICD-10-CM | POA: Diagnosis not present

## 2018-02-09 DIAGNOSIS — R110 Nausea: Secondary | ICD-10-CM | POA: Diagnosis not present

## 2018-02-09 DIAGNOSIS — I951 Orthostatic hypotension: Secondary | ICD-10-CM | POA: Diagnosis not present

## 2018-02-09 DIAGNOSIS — J449 Chronic obstructive pulmonary disease, unspecified: Secondary | ICD-10-CM | POA: Diagnosis not present

## 2018-02-09 DIAGNOSIS — R531 Weakness: Secondary | ICD-10-CM | POA: Diagnosis not present

## 2018-02-09 DIAGNOSIS — I10 Essential (primary) hypertension: Secondary | ICD-10-CM | POA: Diagnosis not present

## 2018-02-09 DIAGNOSIS — D509 Iron deficiency anemia, unspecified: Secondary | ICD-10-CM | POA: Diagnosis not present

## 2018-02-09 DIAGNOSIS — R42 Dizziness and giddiness: Secondary | ICD-10-CM | POA: Diagnosis not present

## 2018-02-09 DIAGNOSIS — R51 Headache: Secondary | ICD-10-CM | POA: Diagnosis not present

## 2018-02-09 DIAGNOSIS — I252 Old myocardial infarction: Secondary | ICD-10-CM | POA: Diagnosis not present

## 2018-02-09 DIAGNOSIS — I25810 Atherosclerosis of coronary artery bypass graft(s) without angina pectoris: Secondary | ICD-10-CM | POA: Diagnosis not present

## 2018-02-23 DIAGNOSIS — J449 Chronic obstructive pulmonary disease, unspecified: Secondary | ICD-10-CM | POA: Diagnosis not present

## 2018-02-23 DIAGNOSIS — I252 Old myocardial infarction: Secondary | ICD-10-CM | POA: Diagnosis not present

## 2018-02-23 DIAGNOSIS — R05 Cough: Secondary | ICD-10-CM | POA: Diagnosis not present

## 2018-02-23 DIAGNOSIS — R042 Hemoptysis: Secondary | ICD-10-CM | POA: Diagnosis not present

## 2018-02-23 DIAGNOSIS — I700 Atherosclerosis of aorta: Secondary | ICD-10-CM | POA: Diagnosis not present

## 2018-02-23 DIAGNOSIS — F17210 Nicotine dependence, cigarettes, uncomplicated: Secondary | ICD-10-CM | POA: Diagnosis not present

## 2018-02-23 DIAGNOSIS — I10 Essential (primary) hypertension: Secondary | ICD-10-CM | POA: Diagnosis not present

## 2018-02-23 DIAGNOSIS — I25810 Atherosclerosis of coronary artery bypass graft(s) without angina pectoris: Secondary | ICD-10-CM | POA: Diagnosis not present

## 2018-02-23 DIAGNOSIS — R0602 Shortness of breath: Secondary | ICD-10-CM | POA: Diagnosis not present

## 2018-02-27 DIAGNOSIS — D649 Anemia, unspecified: Secondary | ICD-10-CM | POA: Diagnosis not present

## 2018-02-27 DIAGNOSIS — R042 Hemoptysis: Secondary | ICD-10-CM | POA: Diagnosis not present

## 2018-02-27 DIAGNOSIS — R110 Nausea: Secondary | ICD-10-CM | POA: Diagnosis not present

## 2018-03-07 DIAGNOSIS — D649 Anemia, unspecified: Secondary | ICD-10-CM | POA: Diagnosis not present

## 2018-03-24 ENCOUNTER — Other Ambulatory Visit: Payer: Self-pay | Admitting: Cardiology

## 2018-03-28 DIAGNOSIS — R110 Nausea: Secondary | ICD-10-CM | POA: Diagnosis not present

## 2018-03-28 DIAGNOSIS — D500 Iron deficiency anemia secondary to blood loss (chronic): Secondary | ICD-10-CM | POA: Diagnosis not present

## 2018-03-28 DIAGNOSIS — K3189 Other diseases of stomach and duodenum: Secondary | ICD-10-CM | POA: Diagnosis not present

## 2018-03-28 DIAGNOSIS — K5730 Diverticulosis of large intestine without perforation or abscess without bleeding: Secondary | ICD-10-CM | POA: Diagnosis not present

## 2018-03-28 DIAGNOSIS — D120 Benign neoplasm of cecum: Secondary | ICD-10-CM | POA: Diagnosis not present

## 2018-03-28 DIAGNOSIS — D128 Benign neoplasm of rectum: Secondary | ICD-10-CM | POA: Diagnosis not present

## 2018-03-28 DIAGNOSIS — K635 Polyp of colon: Secondary | ICD-10-CM | POA: Diagnosis not present

## 2018-03-28 DIAGNOSIS — K2901 Acute gastritis with bleeding: Secondary | ICD-10-CM | POA: Diagnosis not present

## 2018-03-28 DIAGNOSIS — Z791 Long term (current) use of non-steroidal anti-inflammatories (NSAID): Secondary | ICD-10-CM | POA: Diagnosis not present

## 2018-03-28 DIAGNOSIS — K621 Rectal polyp: Secondary | ICD-10-CM | POA: Diagnosis not present

## 2018-05-02 DIAGNOSIS — D500 Iron deficiency anemia secondary to blood loss (chronic): Secondary | ICD-10-CM | POA: Diagnosis not present

## 2018-05-08 DIAGNOSIS — D500 Iron deficiency anemia secondary to blood loss (chronic): Secondary | ICD-10-CM | POA: Diagnosis not present

## 2018-05-08 DIAGNOSIS — R110 Nausea: Secondary | ICD-10-CM | POA: Diagnosis not present

## 2018-05-08 DIAGNOSIS — R195 Other fecal abnormalities: Secondary | ICD-10-CM | POA: Diagnosis not present

## 2018-05-08 DIAGNOSIS — K2901 Acute gastritis with bleeding: Secondary | ICD-10-CM | POA: Diagnosis not present

## 2018-05-19 DIAGNOSIS — R110 Nausea: Secondary | ICD-10-CM | POA: Diagnosis not present

## 2018-05-19 DIAGNOSIS — R112 Nausea with vomiting, unspecified: Secondary | ICD-10-CM | POA: Diagnosis not present

## 2018-06-23 ENCOUNTER — Other Ambulatory Visit: Payer: Self-pay | Admitting: Cardiology

## 2018-07-04 DIAGNOSIS — R0789 Other chest pain: Secondary | ICD-10-CM | POA: Diagnosis not present

## 2018-07-04 DIAGNOSIS — J449 Chronic obstructive pulmonary disease, unspecified: Secondary | ICD-10-CM | POA: Diagnosis not present

## 2018-07-04 DIAGNOSIS — I2510 Atherosclerotic heart disease of native coronary artery without angina pectoris: Secondary | ICD-10-CM | POA: Diagnosis not present

## 2018-07-04 DIAGNOSIS — I209 Angina pectoris, unspecified: Secondary | ICD-10-CM | POA: Diagnosis not present

## 2018-07-04 DIAGNOSIS — E785 Hyperlipidemia, unspecified: Secondary | ICD-10-CM | POA: Diagnosis not present

## 2018-07-04 DIAGNOSIS — I10 Essential (primary) hypertension: Secondary | ICD-10-CM | POA: Diagnosis not present

## 2018-07-04 DIAGNOSIS — R072 Precordial pain: Secondary | ICD-10-CM | POA: Diagnosis not present

## 2018-07-04 DIAGNOSIS — R079 Chest pain, unspecified: Secondary | ICD-10-CM | POA: Diagnosis not present

## 2018-07-05 ENCOUNTER — Inpatient Hospital Stay (HOSPITAL_COMMUNITY)
Admission: AD | Admit: 2018-07-05 | Discharge: 2018-07-09 | DRG: 287 | Disposition: A | Discharge status: Home / Self Care | Payer: Medicare Other | Source: Other Acute Inpatient Hospital | Attending: Internal Medicine | Admitting: Internal Medicine

## 2018-07-05 DIAGNOSIS — Z87891 Personal history of nicotine dependence: Secondary | ICD-10-CM | POA: Diagnosis not present

## 2018-07-05 DIAGNOSIS — I252 Old myocardial infarction: Secondary | ICD-10-CM | POA: Diagnosis not present

## 2018-07-05 DIAGNOSIS — Z7982 Long term (current) use of aspirin: Secondary | ICD-10-CM

## 2018-07-05 DIAGNOSIS — D649 Anemia, unspecified: Secondary | ICD-10-CM | POA: Diagnosis not present

## 2018-07-05 DIAGNOSIS — K219 Gastro-esophageal reflux disease without esophagitis: Secondary | ICD-10-CM | POA: Diagnosis present

## 2018-07-05 DIAGNOSIS — Z96641 Presence of right artificial hip joint: Secondary | ICD-10-CM | POA: Diagnosis present

## 2018-07-05 DIAGNOSIS — F17211 Nicotine dependence, cigarettes, in remission: Secondary | ICD-10-CM | POA: Diagnosis present

## 2018-07-05 DIAGNOSIS — Z9119 Patient's noncompliance with other medical treatment and regimen: Secondary | ICD-10-CM

## 2018-07-05 DIAGNOSIS — R079 Chest pain, unspecified: Secondary | ICD-10-CM | POA: Diagnosis not present

## 2018-07-05 DIAGNOSIS — Z8546 Personal history of malignant neoplasm of prostate: Secondary | ICD-10-CM

## 2018-07-05 DIAGNOSIS — R0789 Other chest pain: Secondary | ICD-10-CM | POA: Diagnosis not present

## 2018-07-05 DIAGNOSIS — I10 Essential (primary) hypertension: Secondary | ICD-10-CM | POA: Diagnosis not present

## 2018-07-05 DIAGNOSIS — Z885 Allergy status to narcotic agent status: Secondary | ICD-10-CM

## 2018-07-05 DIAGNOSIS — K259 Gastric ulcer, unspecified as acute or chronic, without hemorrhage or perforation: Secondary | ICD-10-CM | POA: Diagnosis present

## 2018-07-05 DIAGNOSIS — Z888 Allergy status to other drugs, medicaments and biological substances status: Secondary | ICD-10-CM

## 2018-07-05 DIAGNOSIS — R2689 Other abnormalities of gait and mobility: Secondary | ICD-10-CM | POA: Diagnosis present

## 2018-07-05 DIAGNOSIS — I25110 Atherosclerotic heart disease of native coronary artery with unstable angina pectoris: Principal | ICD-10-CM | POA: Diagnosis present

## 2018-07-05 DIAGNOSIS — I249 Acute ischemic heart disease, unspecified: Secondary | ICD-10-CM | POA: Diagnosis not present

## 2018-07-05 DIAGNOSIS — I5032 Chronic diastolic (congestive) heart failure: Secondary | ICD-10-CM | POA: Diagnosis present

## 2018-07-05 DIAGNOSIS — Z79899 Other long term (current) drug therapy: Secondary | ICD-10-CM | POA: Diagnosis not present

## 2018-07-05 DIAGNOSIS — Z887 Allergy status to serum and vaccine status: Secondary | ICD-10-CM

## 2018-07-05 DIAGNOSIS — Z8249 Family history of ischemic heart disease and other diseases of the circulatory system: Secondary | ICD-10-CM | POA: Diagnosis not present

## 2018-07-05 DIAGNOSIS — T45526A Underdosing of antithrombotic drugs, initial encounter: Secondary | ICD-10-CM | POA: Diagnosis present

## 2018-07-05 DIAGNOSIS — Z881 Allergy status to other antibiotic agents status: Secondary | ICD-10-CM

## 2018-07-05 DIAGNOSIS — I25720 Atherosclerosis of autologous artery coronary artery bypass graft(s) with unstable angina pectoris: Secondary | ICD-10-CM | POA: Diagnosis present

## 2018-07-05 DIAGNOSIS — I340 Nonrheumatic mitral (valve) insufficiency: Secondary | ICD-10-CM | POA: Diagnosis present

## 2018-07-05 DIAGNOSIS — I2510 Atherosclerotic heart disease of native coronary artery without angina pectoris: Secondary | ICD-10-CM | POA: Diagnosis not present

## 2018-07-05 DIAGNOSIS — Z720 Tobacco use: Secondary | ICD-10-CM | POA: Diagnosis present

## 2018-07-05 DIAGNOSIS — Z955 Presence of coronary angioplasty implant and graft: Secondary | ICD-10-CM

## 2018-07-05 DIAGNOSIS — Z91128 Patient's intentional underdosing of medication regimen for other reason: Secondary | ICD-10-CM

## 2018-07-05 DIAGNOSIS — Z951 Presence of aortocoronary bypass graft: Secondary | ICD-10-CM | POA: Diagnosis not present

## 2018-07-05 DIAGNOSIS — I110 Hypertensive heart disease with heart failure: Secondary | ICD-10-CM | POA: Diagnosis present

## 2018-07-05 DIAGNOSIS — Z841 Family history of disorders of kidney and ureter: Secondary | ICD-10-CM

## 2018-07-05 DIAGNOSIS — E785 Hyperlipidemia, unspecified: Secondary | ICD-10-CM | POA: Diagnosis present

## 2018-07-05 DIAGNOSIS — J449 Chronic obstructive pulmonary disease, unspecified: Secondary | ICD-10-CM | POA: Diagnosis present

## 2018-07-05 DIAGNOSIS — D509 Iron deficiency anemia, unspecified: Secondary | ICD-10-CM | POA: Diagnosis present

## 2018-07-05 DIAGNOSIS — Z923 Personal history of irradiation: Secondary | ICD-10-CM

## 2018-07-05 DIAGNOSIS — I209 Angina pectoris, unspecified: Secondary | ICD-10-CM | POA: Diagnosis not present

## 2018-07-05 DIAGNOSIS — I200 Unstable angina: Secondary | ICD-10-CM | POA: Diagnosis not present

## 2018-07-05 DIAGNOSIS — E782 Mixed hyperlipidemia: Secondary | ICD-10-CM | POA: Diagnosis present

## 2018-07-05 DIAGNOSIS — I2582 Chronic total occlusion of coronary artery: Secondary | ICD-10-CM | POA: Diagnosis present

## 2018-07-05 DIAGNOSIS — Z8673 Personal history of transient ischemic attack (TIA), and cerebral infarction without residual deficits: Secondary | ICD-10-CM

## 2018-07-05 DIAGNOSIS — Z7951 Long term (current) use of inhaled steroids: Secondary | ICD-10-CM

## 2018-07-05 DIAGNOSIS — Z9114 Patient's other noncompliance with medication regimen: Secondary | ICD-10-CM | POA: Diagnosis not present

## 2018-07-05 LAB — MRSA PCR SCREENING: MRSA by PCR: NEGATIVE

## 2018-07-05 LAB — TROPONIN I: Troponin I: <0.03 ng/mL (ref <0.03)

## 2018-07-05 MED ORDER — ACETAMINOPHEN 325 MG PO TABS
650.0000 mg | ORAL_TABLET | ORAL | Status: DC | PRN
  Administered 2018-07-05 – 2018-07-06 (×2): 650 mg via ORAL
  Filled 2018-07-05 (×2): qty 2

## 2018-07-05 MED ORDER — ONDANSETRON HCL 4 MG/2ML IJ SOLN
4.0000 mg | Freq: Four times a day (QID) | INTRAMUSCULAR | Status: DC | PRN
  Administered 2018-07-07 – 2018-07-08 (×2): 4 mg via INTRAVENOUS
  Filled 2018-07-05 (×2): qty 2

## 2018-07-05 MED ORDER — HEPARIN (PORCINE) 25000 UT/250ML-% IV SOLN
1300.0000 [IU]/h | INTRAVENOUS | Status: DC
  Administered 2018-07-05: 1300 [IU]/h via INTRAVENOUS
  Administered 2018-07-06: 1400 [IU]/h via INTRAVENOUS
  Administered 2018-07-07: 1300 [IU]/h via INTRAVENOUS
  Filled 2018-07-05 (×2): qty 250

## 2018-07-05 MED ORDER — METOPROLOL TARTRATE 25 MG PO TABS
25.0000 mg | ORAL_TABLET | Freq: Two times a day (BID) | ORAL | Status: DC
  Administered 2018-07-05 – 2018-07-09 (×8): 25 mg via ORAL
  Filled 2018-07-05 (×8): qty 1

## 2018-07-05 MED ORDER — NITROGLYCERIN IN D5W 200-5 MCG/ML-% IV SOLN
0.0000 ug/min | INTRAVENOUS | Status: DC
  Administered 2018-07-05: 6 ug/min via INTRAVENOUS
  Filled 2018-07-05: qty 250

## 2018-07-05 MED ORDER — MORPHINE SULFATE (PF) 2 MG/ML IV SOLN
2.0000 mg | INTRAVENOUS | Status: DC | PRN
  Administered 2018-07-06 – 2018-07-08 (×9): 2 mg via INTRAVENOUS
  Filled 2018-07-05 (×9): qty 1

## 2018-07-05 MED ORDER — HEPARIN BOLUS VIA INFUSION
4000.0000 [IU] | Freq: Once | INTRAVENOUS | Status: AC
  Administered 2018-07-05: 4000 [IU] via INTRAVENOUS
  Filled 2018-07-05: qty 4000

## 2018-07-05 MED ORDER — NICOTINE 21 MG/24HR TD PT24
21.0000 mg | MEDICATED_PATCH | Freq: Every day | TRANSDERMAL | Status: DC
  Filled 2018-07-05 (×2): qty 1

## 2018-07-05 NOTE — Progress Notes (Signed)
ANTICOAGULATION CONSULT NOTE - Initial Consult  Pharmacy Consult for heparin  Indication: chest pain/ACS  Allergies  Allergen Reactions  . Ciprofloxacin Hcl Nausea Only    Wife reported  . Codeine Other (See Comments)    Other reaction(s): GI Upset (intolerance) Pt not sure  . Hydrocodone-Acetaminophen Other (See Comments)    Other reaction(s): GI Upset (intolerance)  . Lisinopril     Wife reported  . Meclizine     Wife reported  . Metronidazole Nausea Only    Wife reported  . Pantoprazole     Wife reported  . Ranitidine     Wife reported  . Simvastatin Other (See Comments)    Other reaction(s): GI Upset (intolerance)  . Influenza Vaccines Nausea And Vomiting    Vital Signs: Temp: 98.2 F (36.8 C) (11/30 1806) Temp Source: Oral (11/30 1806) BP: 152/63 (11/30 1806) Pulse Rate: 64 (11/30 1806)  Labs: No results for input(s): HGB, HCT, PLT, APTT, LABPROT, INR, HEPARINUNFRC, HEPRLOWMOCWT, CREATININE, CKTOTAL, CKMB, TROPONINI in the last 72 hours.  CrCl cannot be calculated (Patient's most recent lab result is older than the maximum 21 days allowed.).   Medical History: Past Medical History:  Diagnosis Date  . Arthritis   . Asthma   . Cancer [facility])    history of prostate cancer- cryoablation  . Cerebrovascular disease   . COPD (chronic obstructive pulmonary disease) ([place])   . Coronary artery disease    a. 2008 s/p CABG x 3 [facility]): LIMA->OM, RIMA->Diag, VG->RPDA;  b. 05/2016 NSTEMI/PCI: DES ->prox LAD and ost diag;  c. 12/2016 Cath/PCI: LM 95d, LAD 95ost (2.75x8 Promus Premier DES), patent prox stent, D2 30, LCX nl, OM2 95, RCA min irregs, LIMA->OM2 ok, RIMA->D2 85 "kink"-improved w/ IC NTG, VG->RPDA 100.  Marland Kitchen Dyspnea   . GERD (gastroesophageal reflux disease)   . Hyperlipidemia   . Hypertension   . Mitral regurgitation    a. mod by cath 12/2016.  . Statin intolerance   . Stroke ([place])   . Syncope 02/2017  . Tobacco abuse     Medications:  Medications  Prior to Admission  Medication Sig Dispense Refill Last Dose  . aspirin 81 MG tablet Take 1 tablet (81 mg total) by mouth daily.   11/26/2017 at 1100  . atorvastatin (LIPITOR) 40 MG tablet Take 1 tablet (40 mg total) by mouth daily at 6 PM. 30 tablet 5   . budesonide-formoterol (SYMBICORT) 80-4.5 MCG/ACT inhaler Inhale 2 puffs into the lungs 2 (two) times daily as needed for shortness of breath.   11/24/2017 at unk  . clopidogrel (PLAVIX) 75 MG tablet Take 75 mg by mouth daily.   11/26/2017 at 1100  . ezetimibe (ZETIA) 10 MG tablet TAKE 1 TABLET BY MOUTH ONCE (1) DAILY 30 tablet 6 11/26/2017 at 1100  . hydrochlorothiazide (MICROZIDE) 12.5 MG capsule Take 12.5 mg by mouth daily.   11/26/2017 at 1100  . isosorbide mononitrate (IMDUR) 30 MG 24 hr tablet Take 1 tablet (30 mg total) by mouth daily. KEEP OV. 90 tablet 0 11/26/2017 at 1100  . losartan (COZAAR) 50 MG tablet Take 50 mg by mouth daily.   11/26/2017 at 1100  . meclizine (ANTIVERT) 25 MG tablet Take 1 tablet (25 mg total) by mouth 3 (three) times daily as needed for dizziness. 30 tablet 0   . metoprolol tartrate (LOPRESSOR) 25 MG tablet Take 1 tablet (25 mg total) by mouth 2 (two) times daily. Please make overdue appt with Dr. Irish Lack before anymore refills.  2nd attempt 30 tablet 0   . nicotine (NICODERM CQ - DOSED IN MG/24 HOURS) 14 mg/24hr patch Place 1 patch (14 mg total) onto the skin daily. 30 patch 0 11/26/2017 at 1100  . nitroGLYCERIN (NITROSTAT) 0.4 MG SL tablet Place 1 tablet (0.4 mg total) under the tongue as needed for chest pain. X 3 doses 25 tablet 3 11/24/2017 at unk    Assessment: 67 yo male to begin heparin for r/o ACS. No anticoagulants noted PTA. -wt 102kg (outpatient office on 11/29)  Goal of Therapy:  Heparin level 0.3-0.7 units/ml Monitor platelets by anticoagulation protocol: Yes   Plan:  -Heparin bolus 4000 units IV followed by 1300 units/hr -Heparin level in 6 hours and daily wth CBC daily  Hildred Laser,  PharmD Clinical Pharmacist **Pharmacist phone directory can now be found on [email or web address] ([email or web address]).  Listed under [place].

## 2018-07-05 NOTE — H&P (Signed)
History and Physical   OATHER MUILENBURG NWG:[id number] DOB: July 22, 1951 DOA: 07/05/2018  Referring MD/NP/PA: [facility]  PCP: [name], MD   Outpatient Specialists: Cardiology  Patient coming from: [facility]  Chief Complaint: Chest  HPI: [name] is a 67 y.o. male with medical history significant of coronary artery disease status post coronary artery bypass graft and multiple MCI with recent catheterization in April of this year.  Patient went to [facility] with chest pain which is been persistent.  Chest pain is consistent with his prior MI.  Still having chest pain at 6 out of 10 even on arrival 8 count.  He had normal enzymes apparently with EKG consistent with previous.  Patient had reproducible chest pain and nausea as well as jaw pain with ambulation.  He has problem with medication noncompliance and has been not taken all his medications with exception of metoprolol.  He was therefore seen and evaluated and suspected to have had unstable angina.  Discussion with cardiology was done and recommendation was to transfer patient to [facility] for elective cardiac catheterization as he is high risk for cardiac stress testing..  Review of Systems: As per HPI otherwise 10 point review of systems negative.    Past Medical History:  Diagnosis Date  . Arthritis   . Asthma   . Cancer [facility])    history of prostate cancer- cryoablation  . Cerebrovascular disease   . COPD (chronic obstructive pulmonary disease) ([place])   . Coronary artery disease    a. 2008 s/p CABG x 3 [facility]): LIMA->OM, RIMA->Diag, VG->RPDA;  b. 05/2016 NSTEMI/PCI: DES ->prox LAD and ost diag;  c. 12/2016 Cath/PCI: LM 95d, LAD 95ost (2.75x8 Promus Premier DES), patent prox stent, D2 30, LCX nl, OM2 95, RCA min irregs, LIMA->OM2 ok, RIMA->D2 85 "kink"-improved w/ IC NTG, VG->RPDA 100.  Marland Kitchen Dyspnea   . GERD (gastroesophageal reflux disease)   . Hyperlipidemia   . Hypertension   . Mitral regurgitation     a. mod by cath 12/2016.  . Statin intolerance   . Stroke ([place])   . Syncope 02/2017  . Tobacco abuse     Past Surgical History:  Procedure Laterality Date  . ABDOMINAL AORTOGRAM N/A 12/26/2016   Procedure: Abdominal Aortogram;  Surgeon: Troy Sine, MD;  Location: [place] [facility];  Service: Cardiovascular;  Laterality: N/A;  . BACK SURGERY  2009  . CORONARY ANGIOGRAPHY N/A 12/28/2016   Procedure: Coronary Angiography;  Surgeon: Burnell Blanks, MD;  Location: [place] [facility];  Service: Cardiovascular;  Laterality: N/A;  . CORONARY ARTERY BYPASS GRAFT  2008  . CORONARY ATHERECTOMY N/A 12/28/2016   Procedure: Coronary Atherectomy;  Surgeon: Burnell Blanks, MD;  Location: [place] [facility];  Service: Cardiovascular;  Laterality: N/A;  . CORONARY STENT INTERVENTION N/A 12/28/2016   Procedure: Coronary Stent Intervention;  Surgeon: Burnell Blanks, MD;  Location: [place] [facility];  Service: Cardiovascular;  Laterality: N/A;  . LEFT HEART CATH Left 05/07/2016   PR CATH PLACE/CORON ANGIO, IMG SUPER/TERP, W  [facility] CATH; SEVICE: CARDIOLOGY  . LEFT HEART CATH AND CORS/GRAFTS ANGIOGRAPHY N/A 12/26/2016   Procedure: Left Heart Cath and Cors/Grafts Angiography;  Surgeon: Troy Sine, MD;  Location: [place] [facility];  Service: Cardiovascular;  Laterality: N/A;  . LEFT HEART CATH AND CORS/GRAFTS ANGIOGRAPHY N/A 11/26/2017   Procedure: LEFT HEART CATH AND CORS/GRAFTS ANGIOGRAPHY;  Surgeon: Martinique, Peter M, MD;  Location: [place] [facility];  Service: Cardiovascular;  Laterality: N/A;  . LEFT HEART VENTRICULOGRAPHY Left 05/14/2016   PR CATH PLACE/ CORON ANGIO, IMG SUPER/ INTERP, W   . TOTAL HIP ARTHROPLASTY Right 2013     reports that he has been smoking cigarettes. He has a 14.00 pack-year smoking history. He has never used smokeless tobacco. He reports that he has current or past drug history. He reports that he does not drink alcohol.  Allergies  Allergen  Reactions  . Ciprofloxacin Hcl Nausea Only    Wife reported  . Codeine Other (See Comments)    Other reaction(s): GI Upset (intolerance) Pt not sure  . Hydrocodone-Acetaminophen Other (See Comments)    Other reaction(s): GI Upset (intolerance)  . Lisinopril     Wife reported  . Meclizine     Wife reported  . Metronidazole Nausea Only    Wife reported  . Pantoprazole     Wife reported  . Ranitidine     Wife reported  . Simvastatin Other (See Comments)    Other reaction(s): GI Upset (intolerance)  . Influenza Vaccines Nausea And Vomiting    Family History  Problem Relation Age of Onset  . Kidney failure Mother   . Hypertension Sister   . Hypertension Brother   . Hypertension Brother      Prior to Admission medications   Medication Sig Start Date End Date Taking? Authorizing Provider  aspirin 81 MG tablet Take 1 tablet (81 mg total) by mouth daily. 12/29/16   Theora Gianotti, NP  atorvastatin (LIPITOR) 40 MG tablet Take 1 tablet (40 mg total) by mouth daily at 6 PM. 11/27/17   Rosita Fire, Wilmer Floor, PA-C  budesonide-formoterol (SYMBICORT) 80-4.5 MCG/ACT inhaler Inhale 2 puffs into the lungs 2 (two) times daily as needed for shortness of breath.    [provider]  clopidogrel (PLAVIX) 75 MG tablet Take 75 mg by mouth daily.    [provider]  ezetimibe (ZETIA) 10 MG tablet TAKE 1 TABLET BY MOUTH ONCE (1) DAILY 11/18/17   Lyda Jester M, PA-C  hydrochlorothiazide (MICROZIDE) 12.5 MG capsule Take 12.5 mg by mouth daily.    [provider]  isosorbide mononitrate (IMDUR) 30 MG 24 hr tablet Take 1 tablet (30 mg total) by mouth daily. KEEP OV. 11/06/17   Lyda Jester M, PA-C  losartan (COZAAR) 50 MG tablet Take 50 mg by mouth daily. 08/10/14   [provider]  meclizine (ANTIVERT) 25 MG tablet Take 1 tablet (25 mg total) by mouth 3 (three) times daily as needed for dizziness. 11/27/17   Lyda Jester M, PA-C  metoprolol  tartrate (LOPRESSOR) 25 MG tablet Take 1 tablet (25 mg total) by mouth 2 (two) times daily. Please make overdue appt with Dr. Irish Lack before anymore refills. 2nd attempt 06/24/18   Lyda Jester M, PA-C  nicotine (NICODERM CQ - DOSED IN MG/24 HOURS) 14 mg/24hr patch Place 1 patch (14 mg total) onto the skin daily. 12/29/16   Theora Gianotti, NP  nitroGLYCERIN (NITROSTAT) 0.4 MG SL tablet Place 1 tablet (0.4 mg total) under the tongue as needed for chest pain. X 3 doses 12/29/16   Theora Gianotti, NP    Physical Exam: Vitals:   07/05/18 1806 07/05/18 1932  BP: (!) 152/63 (!) 141/71  Pulse: 64   Resp: 15   Temp: 98.2 F (36.8 C) 97.9 F (36.6 C)  TempSrc: Oral Oral  SpO2: 99%       Constitutional: NAD, calm, comfortable Vitals:   07/05/18  1806 07/05/18 1932  BP: (!) 152/63 (!) 141/71  Pulse: 64   Resp: 15   Temp: 98.2 F (36.8 C) 97.9 F (36.6 C)  TempSrc: Oral Oral  SpO2: 99%    Eyes: PERRL, lids and conjunctivae normal ENMT: Mucous membranes are moist. Posterior pharynx clear of any exudate or lesions.Normal dentition.  Neck: normal, supple, no masses, no thyromegaly Respiratory: clear to auscultation bilaterally, no wheezing, no crackles. Normal respiratory effort. No accessory muscle use.  Cardiovascular: Regular rate and rhythm, no murmurs / rubs / gallops. No extremity edema. 2+ pedal pulses. No carotid bruits.  Abdomen: no tenderness, no masses palpated. No hepatosplenomegaly. Bowel sounds positive.  Musculoskeletal: no clubbing / cyanosis. No joint deformity upper and lower extremities. Good ROM, no contractures. Normal muscle tone.  Skin: no rashes, lesions, ulcers. No induration Neurologic: CN 2-12 grossly intact. Sensation intact, DTR normal. Strength 5/5 in all 4.  Psychiatric: Normal judgment and insight. Alert and oriented x 3. Normal mood.     Labs on Admission: I have personally reviewed following labs and imaging studies  CBC: No  results for input(s): WBC, NEUTROABS, HGB, HCT, MCV, PLT in the last 168 hours. Basic Metabolic Panel: No results for input(s): NA, K, CL, CO2, GLUCOSE, BUN, CREATININE, CALCIUM, MG, PHOS in the last 168 hours. GFR: CrCl cannot be calculated (Patient's most recent lab result is older than the maximum 21 days allowed.). Liver Function Tests: No results for input(s): AST, ALT, ALKPHOS, BILITOT, PROT, ALBUMIN in the last 168 hours. No results for input(s): LIPASE, AMYLASE in the last 168 hours. No results for input(s): AMMONIA in the last 168 hours. Coagulation Profile: No results for input(s): INR, PROTIME in the last 168 hours. Cardiac Enzymes: Recent Labs  Lab 07/05/18 1846  TROPONINI <0.03   BNP (last 3 results) No results for input(s): PROBNP in the last 8760 hours. HbA1C: No results for input(s): HGBA1C in the last 72 hours. CBG: No results for input(s): GLUCAP in the last 168 hours. Lipid Profile: No results for input(s): CHOL, HDL, LDLCALC, TRIG, CHOLHDL, LDLDIRECT in the last 72 hours. Thyroid Function Tests: No results for input(s): TSH, T4TOTAL, FREET4, T3FREE, THYROIDAB in the last 72 hours. Anemia Panel: No results for input(s): VITAMINB12, FOLATE, FERRITIN, TIBC, IRON, RETICCTPCT in the last 72 hours. Urine analysis:    Component Value Date/Time   COLORURINE YELLOW 02/14/2007 1236   APPEARANCEUR CLEAR 02/14/2007 1236   LABSPEC 1.016 02/14/2007 1236   PHURINE 8.0 02/14/2007 1236   GLUCOSEU NEGATIVE 02/14/2007 1236   HGBUR NEGATIVE 02/14/2007 1236   BILIRUBINUR NEGATIVE 02/14/2007 1236   KETONESUR NEGATIVE 02/14/2007 1236   PROTEINUR NEGATIVE 02/14/2007 1236   UROBILINOGEN 0.2 02/14/2007 1236   NITRITE NEGATIVE 02/14/2007 1236   LEUKOCYTESUR  02/14/2007 1236    NEGATIVE MICROSCOPIC NOT DONE ON URINES WITH NEGATIVE PROTEIN, BLOOD, LEUKOCYTES, NITRITE, OR GLUCOSE <1000 mg/dL.   Sepsis Labs: @LABRCNTIP (procalcitonin:4,lacticidven:4) ) Recent Results (from the  past 240 hour(s))  MRSA PCR Screening     Status: None   Collection Time: 07/05/18  6:11 PM  Result Value Ref Range Status   MRSA by PCR NEGATIVE NEGATIVE Final    Comment:        The GeneXpert MRSA Assay (FDA approved for NASAL specimens only), is one component of a comprehensive MRSA colonization surveillance program. It is not intended to diagnose MRSA infection nor to guide or monitor treatment for MRSA infections. Performed at [facility], [place] [address], [place], [place]  [place] on Admission: No results found.  Assessment/Plan Principal Problem:   Unstable angina (HCC) Active Problems:   COPD (chronic obstructive pulmonary disease) (HCC)   GERD (gastroesophageal reflux disease)   Hx of CABG   Hyperlipidemia   Hypertension   Mixed dyslipidemia   Tobacco abuse     #1 unstable angina: Patient will be admitted and initiated on nitroglycerin drip as well as heparin drip.  Continue beta-blockers and monitor patient overnight.  Morphine as needed as well as oxygen.  Cardiology will be informed said patient can have schedule cardiac catheterization.  #2 coronary artery disease: Status post coronary artery bypass graft.  Continue per cardiology.  #3 hypertension: Blood pressure appears controlled.  Continue home regimen.  #4 hyperlipidemia: Continue with statin.  #5 tobacco abuse: Counseling provided.  Will initiate nicotine patch.  #6 history of COPD: No exacerbation.  Empirically treat.   DVT prophylaxis: Heparin Code Status: Full code Family Communication: No family with patient Disposition Plan: Home Consults called: Cardiology Admission status: Inpatient  Severity of Illness: The appropriate patient status for this patient is INPATIENT. Inpatient status is judged to be reasonable and necessary in order to provide the required intensity of service to ensure the patient's safety. The patient's presenting symptoms, physical exam  findings, and initial radiographic and laboratory data in the context of their chronic comorbidities is felt to place them at high risk for further clinical deterioration. Furthermore, it is not anticipated that the patient will be medically stable for discharge from the hospital within 2 midnights of admission. The following factors support the patient status of inpatient.   " The patient's presenting symptoms include ongoing chest pain. " The worrisome physical exam findings include no significant finding on exam. " The initial radiographic and laboratory data are worrisome because of EKG changes. " The chronic co-morbidities include known history of coronary artery disease.   * I certify that at the point of admission it is my clinical judgment that the patient will require inpatient hospital care spanning beyond 2 midnights from the point of admission due to high intensity of service, high risk for further deterioration and high frequency of surveillance required.Barbette Merino MD [facility] Pager [phone]  If 7PM-7AM, please contact night-coverage [email or web address] Password [facility]  07/05/2018, 8:27 PM

## 2018-07-06 DIAGNOSIS — I200 Unstable angina: Secondary | ICD-10-CM

## 2018-07-06 LAB — HEPARIN LEVEL (UNFRACTIONATED)
HEPARIN UNFRACTIONATED: 0.44 [IU]/mL (ref 0.30–0.70)
Heparin Unfractionated: 0.27 IU/mL — ABNORMAL LOW (ref 0.30–0.70)
Heparin Unfractionated: 0.84 IU/mL — ABNORMAL HIGH (ref 0.30–0.70)

## 2018-07-06 LAB — IRON AND TIBC
Iron: 61 ug/dL (ref 45–182)
Saturation Ratios: 18 % (ref 17.9–39.5)
TIBC: 337 ug/dL (ref 250–450)
UIBC: 276 ug/dL

## 2018-07-06 LAB — CBC
HCT: 28.8 % — ABNORMAL LOW (ref 39.0–52.0)
Hemoglobin: 8.8 g/dL — ABNORMAL LOW (ref 13.0–17.0)
MCH: 26.9 pg (ref 26.0–34.0)
MCHC: 30.6 g/dL (ref 30.0–36.0)
MCV: 88.1 fL (ref 80.0–100.0)
Platelets: 307 10*3/uL (ref 150–400)
RBC: 3.27 MIL/uL — ABNORMAL LOW (ref 4.22–5.81)
RDW: 18.5 % — ABNORMAL HIGH (ref 11.5–15.5)
WBC: 11 10*3/uL — AB (ref 4.0–10.5)
nRBC: 0 % (ref 0.0–0.2)

## 2018-07-06 LAB — HIV ANTIBODY (ROUTINE TESTING W REFLEX): HIV Screen 4th Generation wRfx: NONREACTIVE

## 2018-07-06 LAB — FERRITIN: FERRITIN: 22 ng/mL — AB (ref 24–336)

## 2018-07-06 LAB — TROPONIN I: Troponin I: <0.03 ng/mL (ref <0.03)

## 2018-07-06 MED ORDER — SODIUM CHLORIDE 0.9 % IV SOLN: 250.0000 mL | INTRAVENOUS | Status: DC | PRN

## 2018-07-06 MED ORDER — SODIUM CHLORIDE 0.9% FLUSH
3.0000 mL | Freq: Two times a day (BID) | INTRAVENOUS | Status: DC
  Administered 2018-07-06 – 2018-07-07 (×2): 3 mL via INTRAVENOUS

## 2018-07-06 MED ORDER — SODIUM CHLORIDE 0.9 % WEIGHT BASED INFUSION
3.0000 mL/kg/h | INTRAVENOUS | Status: DC
  Administered 2018-07-07: 3 mL/kg/h via INTRAVENOUS

## 2018-07-06 MED ORDER — ASPIRIN 81 MG PO CHEW
81.0000 mg | CHEWABLE_TABLET | Freq: Once | ORAL | Status: AC
  Administered 2018-07-06: 81 mg via ORAL
  Filled 2018-07-06: qty 1

## 2018-07-06 MED ORDER — ATORVASTATIN CALCIUM 40 MG PO TABS
40.0000 mg | ORAL_TABLET | Freq: Every day | ORAL | Status: DC
  Administered 2018-07-06 – 2018-07-08 (×3): 40 mg via ORAL
  Filled 2018-07-06 (×3): qty 1

## 2018-07-06 MED ORDER — EZETIMIBE 10 MG PO TABS
10.0000 mg | ORAL_TABLET | Freq: Every day | ORAL | Status: DC
  Administered 2018-07-06 – 2018-07-09 (×4): 10 mg via ORAL
  Filled 2018-07-06 (×4): qty 1

## 2018-07-06 MED ORDER — ASPIRIN 81 MG PO CHEW
81.0000 mg | CHEWABLE_TABLET | ORAL | Status: AC
  Administered 2018-07-07: 81 mg via ORAL
  Filled 2018-07-06: qty 1

## 2018-07-06 MED ORDER — SODIUM CHLORIDE 0.9% FLUSH: 3.0000 mL | INTRAVENOUS | Status: DC | PRN

## 2018-07-06 MED ORDER — SODIUM CHLORIDE 0.9 % WEIGHT BASED INFUSION
1.0000 mL/kg/h | INTRAVENOUS | Status: DC
  Administered 2018-07-07: 1 mL/kg/h via INTRAVENOUS

## 2018-07-06 MED ORDER — LOSARTAN POTASSIUM 25 MG PO TABS
25.0000 mg | ORAL_TABLET | Freq: Every day | ORAL | Status: DC
  Administered 2018-07-06 – 2018-07-09 (×4): 25 mg via ORAL
  Filled 2018-07-06 (×5): qty 1

## 2018-07-06 NOTE — Progress Notes (Signed)
ANTICOAGULATION CONSULT NOTE - Follow up consult  Pharmacy Consult for heparin  Indication: chest pain/ACS  Allergies  Allergen Reactions  . Ciprofloxacin Hcl Nausea Only    Wife reported  . Codeine Other (See Comments)    Other reaction(s): GI Upset (intolerance) Pt not sure  . Hydrocodone-Acetaminophen Other (See Comments)    Other reaction(s): GI Upset (intolerance)  . Lisinopril     Wife reported  . Meclizine     Wife reported  . Metronidazole Nausea Only    Wife reported  . Pantoprazole     Wife reported  . Ranitidine     Wife reported  . Simvastatin Other (See Comments)    Other reaction(s): GI Upset (intolerance)  . Influenza Vaccines Nausea And Vomiting    Vital Signs: Temp: 97.6 F (36.4 C) (12/01 0744) Temp Source: Oral (12/01 0744) BP: 134/71 (12/01 0927) Pulse Rate: 59 (12/01 0927)     Labs: Recent Labs    07/05/18 1846 07/05/18 2109 07/06/18 0011 07/06/18 0232 07/06/18 0233 07/06/18 0952  HGB  --   --   --   --  8.8*  --   HCT  --   --   --   --  28.8*  --   PLT  --   --   --   --  307  --   HEPARINUNFRC  --   --   --  0.44  --  0.27*  TROPONINI <0.03 <0.03 <0.03  --   --   --     CrCl cannot be calculated (Patient's most recent lab result is older than the maximum 21 days allowed.).   Medical History: Past Medical History:  Diagnosis Date  . Arthritis   . Asthma   . Cancer [facility])    history of prostate cancer- cryoablation  . Cerebrovascular disease   . COPD (chronic obstructive pulmonary disease) ([place])   . Coronary artery disease    a. 2008 s/p CABG x 3 [facility]): LIMA->OM, RIMA->Diag, VG->RPDA;  b. 05/2016 NSTEMI/PCI: DES ->prox LAD and ost diag;  c. 12/2016 Cath/PCI: LM 95d, LAD 95ost (2.75x8 Promus Premier DES), patent prox stent, D2 30, LCX nl, OM2 95, RCA min irregs, LIMA->OM2 ok, RIMA->D2 85 "kink"-improved w/ IC NTG, VG->RPDA 100.  Marland Kitchen Dyspnea   . GERD (gastroesophageal reflux disease)   . Hyperlipidemia   . Hypertension    . Mitral regurgitation    a. mod by cath 12/2016.  . Statin intolerance   . Stroke ([place])   . Syncope 02/2017  . Tobacco abuse     Medications:  Medications Prior to Admission  Medication Sig Dispense Refill Last Dose  . aspirin 81 MG tablet Take 1 tablet (81 mg total) by mouth daily.   11/26/2017 at 1100  . atorvastatin (LIPITOR) 40 MG tablet Take 1 tablet (40 mg total) by mouth daily at 6 PM. 30 tablet 5   . budesonide-formoterol (SYMBICORT) 80-4.5 MCG/ACT inhaler Inhale 2 puffs into the lungs 2 (two) times daily as needed for shortness of breath.   11/24/2017 at unk  . clopidogrel (PLAVIX) 75 MG tablet Take 75 mg by mouth daily.   11/26/2017 at 1100  . ezetimibe (ZETIA) 10 MG tablet TAKE 1 TABLET BY MOUTH ONCE (1) DAILY 30 tablet 6 11/26/2017 at 1100  . hydrochlorothiazide (MICROZIDE) 12.5 MG capsule Take 12.5 mg by mouth daily.   11/26/2017 at 1100  . isosorbide mononitrate (IMDUR) 30 MG 24 hr tablet Take 1 tablet (30 mg total)  by mouth daily. KEEP OV. 90 tablet 0 11/26/2017 at 1100  . losartan (COZAAR) 50 MG tablet Take 50 mg by mouth daily.   11/26/2017 at 1100  . meclizine (ANTIVERT) 25 MG tablet Take 1 tablet (25 mg total) by mouth 3 (three) times daily as needed for dizziness. 30 tablet 0   . metoprolol tartrate (LOPRESSOR) 25 MG tablet Take 1 tablet (25 mg total) by mouth 2 (two) times daily. Please make overdue appt with Dr. Irish Lack before anymore refills. 2nd attempt 30 tablet 0   . nicotine (NICODERM CQ - DOSED IN MG/24 HOURS) 14 mg/24hr patch Place 1 patch (14 mg total) onto the skin daily. 30 patch 0 11/26/2017 at 1100  . nitroGLYCERIN (NITROSTAT) 0.4 MG SL tablet Place 1 tablet (0.4 mg total) under the tongue as needed for chest pain. X 3 doses 25 tablet 3 11/24/2017 at unk    Assessment: 67 yo male to begin heparin for r/o ACS. No anticoagulants noted PTA. Wt 102 kg (per office note 11/29). Heparin level subtherapeutic at 0.27 on heparin 1300 units/hr. Hb low but stable. No  signs/symptoms of bleeding or issues with infusion reported by nursing.   Goal of Therapy:  Heparin level 0.3-0.7 units/ml Monitor platelets by anticoagulation protocol: Yes   Plan:  -Increase heparin infusion to 1400 units/hr -Heparin level in 6 hours and daily wth CBC daily  Claiborne Billings, PharmD PGY2 Cardiology Pharmacy Resident Phone [phone] Please check AMION for all Pharmacist numbers by unit 07/06/2018 11:52 AM

## 2018-07-06 NOTE — Progress Notes (Signed)
ANTICOAGULATION CONSULT NOTE - Follow up consult  Pharmacy Consult for heparin  Indication: chest pain/ACS  Allergies  Allergen Reactions  . Ciprofloxacin Hcl Nausea Only    Wife reported  . Codeine Other (See Comments)    Other reaction(s): GI Upset (intolerance) Pt not sure  . Hydrocodone-Acetaminophen Other (See Comments)    Other reaction(s): GI Upset (intolerance)  . Lisinopril     Wife reported  . Meclizine     Wife reported  . Metronidazole Nausea Only    Wife reported  . Pantoprazole     Wife reported  . Ranitidine     Wife reported  . Simvastatin Other (See Comments)    Other reaction(s): GI Upset (intolerance)  . Influenza Vaccines Nausea And Vomiting    Vital Signs: Temp: 98.3 F (36.8 C) (12/01 1614) Temp Source: Oral (12/01 1614) BP: 158/74 (12/01 1614) Pulse Rate: 59 (12/01 0927)     Labs: Recent Labs    07/05/18 1846 07/05/18 2109 07/06/18 0011 07/06/18 0232 07/06/18 0233 07/06/18 0952 07/06/18 1757  HGB  --   --   --   --  8.8*  --   --   HCT  --   --   --   --  28.8*  --   --   PLT  --   --   --   --  307  --   --   HEPARINUNFRC  --   --   --  0.44  --  0.27* 0.84*  TROPONINI <0.03 <0.03 <0.03  --   --   --   --     CrCl cannot be calculated (Patient's most recent lab result is older than the maximum 21 days allowed.).   Medical History: Past Medical History:  Diagnosis Date  . Arthritis   . Asthma   . Cancer [facility])    history of prostate cancer- cryoablation  . Cerebrovascular disease   . COPD (chronic obstructive pulmonary disease) ([place])   . Coronary artery disease    a. 2008 s/p CABG x 3 [facility]): LIMA->OM, RIMA->Diag, VG->RPDA;  b. 05/2016 NSTEMI/PCI: DES ->prox LAD and ost diag;  c. 12/2016 Cath/PCI: LM 95d, LAD 95ost (2.75x8 Promus Premier DES), patent prox stent, D2 30, LCX nl, OM2 95, RCA min irregs, LIMA->OM2 ok, RIMA->D2 85 "kink"-improved w/ IC NTG, VG->RPDA 100.  Marland Kitchen Dyspnea   . GERD (gastroesophageal reflux  disease)   . Hyperlipidemia   . Hypertension   . Mitral regurgitation    a. mod by cath 12/2016.  . Statin intolerance   . Stroke ([place])   . Syncope 02/2017  . Tobacco abuse     Medications:  Medications Prior to Admission  Medication Sig Dispense Refill Last Dose  . aspirin 81 MG tablet Take 1 tablet (81 mg total) by mouth daily.   11/26/2017 at 1100  . atorvastatin (LIPITOR) 40 MG tablet Take 1 tablet (40 mg total) by mouth daily at 6 PM. 30 tablet 5   . budesonide-formoterol (SYMBICORT) 80-4.5 MCG/ACT inhaler Inhale 2 puffs into the lungs 2 (two) times daily as needed for shortness of breath.   11/24/2017 at unk  . clopidogrel (PLAVIX) 75 MG tablet Take 75 mg by mouth daily.   11/26/2017 at 1100  . ezetimibe (ZETIA) 10 MG tablet TAKE 1 TABLET BY MOUTH ONCE (1) DAILY 30 tablet 6 11/26/2017 at 1100  . hydrochlorothiazide (MICROZIDE) 12.5 MG capsule Take 12.5 mg by mouth daily.   11/26/2017 at 1100  .  isosorbide mononitrate (IMDUR) 30 MG 24 hr tablet Take 1 tablet (30 mg total) by mouth daily. KEEP OV. 90 tablet 0 11/26/2017 at 1100  . losartan (COZAAR) 50 MG tablet Take 50 mg by mouth daily.   11/26/2017 at 1100  . meclizine (ANTIVERT) 25 MG tablet Take 1 tablet (25 mg total) by mouth 3 (three) times daily as needed for dizziness. 30 tablet 0   . metoprolol tartrate (LOPRESSOR) 25 MG tablet Take 1 tablet (25 mg total) by mouth 2 (two) times daily. Please make overdue appt with Dr. Irish Lack before anymore refills. 2nd attempt 30 tablet 0   . nicotine (NICODERM CQ - DOSED IN MG/24 HOURS) 14 mg/24hr patch Place 1 patch (14 mg total) onto the skin daily. 30 patch 0 11/26/2017 at 1100  . nitroGLYCERIN (NITROSTAT) 0.4 MG SL tablet Place 1 tablet (0.4 mg total) under the tongue as needed for chest pain. X 3 doses 25 tablet 3 11/24/2017 at unk    Assessment: 67 yo male to begin heparin for r/o ACS. No anticoagulants noted PTA.  -heparin level up to 0.84 on 1400 units/hr  Goal of Therapy:  Heparin  level 0.3-0.7 units/ml Monitor platelets by anticoagulation protocol: Yes   Plan:  -decrease heparin to 1300 units/hr -daily heparin level and CBC  Hildred Laser, PharmD Clinical Pharmacist **Pharmacist phone directory can now be found on [email or web address] ([email or web address]).  Listed under [place].

## 2018-07-06 NOTE — Progress Notes (Signed)
ANTICOAGULATION CONSULT NOTE - Follow Up Consult  Pharmacy Consult for heparin Indication: USAP  Labs: Recent Labs    07/05/18 1846 07/05/18 2109 07/06/18 0011 07/06/18 0232 07/06/18 0233  HGB  --   --   --   --  8.8*  HCT  --   --   --   --  28.8*  PLT  --   --   --   --  307  HEPARINUNFRC  --   --   --  0.44  --   TROPONINI <0.03 <0.03 <0.03  --   --     Assessment/Plan:  67yo male therapeutic on heparin with initial dosing for USAP. Will continue gtt at current rate and confirm stable with additional level.   Wynona Neat, PharmD, BCPS  07/06/2018,4:16 AM

## 2018-07-06 NOTE — Consult Note (Addendum)
Cardiology Consultation:   Patient ID: NUCHEM GRATTAN MRN: [id number]; DOB: 1951-04-04  Admit date: 07/05/2018 Date of Consult: 07/06/2018  Primary Care Provider: [name], MD Primary Cardiologist: Larae Grooms, MD  Primary Electrophysiologist:  None    Patient Profile:   [name] is a 67 y.o. male with a hx of CAD s/p CABG, prastate CA, HTN, HLD with h/o statin intolerance and tobacco abuse (quit 2 month ago) who is being seen today for the evaluation of chest pain at the request of Dr. Nevada Crane. Patient is a transfer from [facility]  History of Present Illness:   [name] is a 67 year old Caucasian male with past medical history of CAD s/p CABG, prastate CA, HTN, HLD with h/o statin intolerance and tobacco abuse (quit 2 month ago) who was transferred from [facility] to [facility] for unstable angina.  Patient had bypass surgery in 2009.  Her last PCI was in May 2018 at which time he received PTCA and DES to ostial LAD.  He returned to the hospital in April 2019 with recurrent chest pain.  He ended up having another cardiac catheterization on 11/26/2017 which showed two-vessel disease with patent LIMA to OM, patent RIMA to diagonal, occluded SVG to RCA, 70% mid to distal left circumflex unchanged when compared to the previous cath.  This is fairly small vessel.  Therefore medical therapy was recommended.  A statin was added during that admission to see if he can tolerate it.  He has a history of statin intolerance.  If he fails another statin therapy, we Felisia Balcom consider PCSK9 inhibitor.  Since his discharge, he has failed to follow-up with cardiology service.  About 86-month ago, he stopped all his medication except for metoprolol.  He was also diagnosed with gastric ulcer by his gastroenterologist.  He was given a short course of PPI therapy.  Since last year, he has also been more anemic as well.  For the past 2 weeks, he has been noticing intermittent exertional chest  pain relieved by rest.  Although chest pain occasionally occurs at rest as well, most of which tend to occur more so with exertion.  He has been taking some nitroglycerin on the side as well.  During the Thanksgiving holiday, his symptoms worsened prompting him to seek medical attention at [facility].  Serial troponin negative x4.  Given the concerning symptom, cardiology service was consulted at [facility].  Patient was seen by Dr. Bettina Gavia.  Since he was noncompliant with his medication, it was hesitant to proceed with cardiac catheterization at the time, Dr. Bettina Gavia recommended reinitiate previous therapy and to see if the patient's symptoms were to improve.  His blood pressure on arrival to the right of hospital was over 180.  Hemoglobin down from 11.8 in September 2018 down to 8.8 today.  Given persistent exertional symptom, patient was transferred to [facility] for consideration of cardiac catheterization.  Past Medical History:  Diagnosis Date  . Arthritis   . Asthma   . Cancer [facility])    history of prostate cancer- cryoablation  . Cerebrovascular disease   . COPD (chronic obstructive pulmonary disease) ([place])   . Coronary artery disease    a. 2008 s/p CABG x 3 [facility]): LIMA->OM, RIMA->Diag, VG->RPDA;  b. 05/2016 NSTEMI/PCI: DES ->prox LAD and ost diag;  c. 12/2016 Cath/PCI: LM 95d, LAD 95ost (2.75x8 Promus Premier DES), patent prox stent, D2 30, LCX nl, OM2 95, RCA min irregs, LIMA->OM2 ok, RIMA->D2 85 "  kink"-improved w/ IC NTG, VG->RPDA 100.  Marland Kitchen Dyspnea   . GERD (gastroesophageal reflux disease)   . Hyperlipidemia   . Hypertension   . Mitral regurgitation    a. mod by cath 12/2016.  . Statin intolerance   . Stroke ([place])   . Syncope 02/2017  . Tobacco abuse     Past Surgical History:  Procedure Laterality Date  . ABDOMINAL AORTOGRAM N/A 12/26/2016   Procedure: Abdominal Aortogram;  Surgeon: Troy Sine, MD;  Location: [place] CV LAB;  Service:  Cardiovascular;  Laterality: N/A;  . BACK SURGERY  2009  . CORONARY ANGIOGRAPHY N/A 12/28/2016   Procedure: Coronary Angiography;  Surgeon: Burnell Blanks, MD;  Location: [place] CV LAB;  Service: Cardiovascular;  Laterality: N/A;  . CORONARY ARTERY BYPASS GRAFT  2008  . CORONARY ATHERECTOMY N/A 12/28/2016   Procedure: Coronary Atherectomy;  Surgeon: Burnell Blanks, MD;  Location: [place] CV LAB;  Service: Cardiovascular;  Laterality: N/A;  . CORONARY STENT INTERVENTION N/A 12/28/2016   Procedure: Coronary Stent Intervention;  Surgeon: Burnell Blanks, MD;  Location: [place] CV LAB;  Service: Cardiovascular;  Laterality: N/A;  . LEFT HEART CATH Left 05/07/2016   PR CATH PLACE/CORON ANGIO, IMG SUPER/TERP, W  [facility] CATH; SEVICE: CARDIOLOGY  . LEFT HEART CATH AND CORS/GRAFTS ANGIOGRAPHY N/A 12/26/2016   Procedure: Left Heart Cath and Cors/Grafts Angiography;  Surgeon: Troy Sine, MD;  Location: [place] CV LAB;  Service: Cardiovascular;  Laterality: N/A;  . LEFT HEART CATH AND CORS/GRAFTS ANGIOGRAPHY N/A 11/26/2017   Procedure: LEFT HEART CATH AND CORS/GRAFTS ANGIOGRAPHY;  Surgeon: Martinique, Peter M, MD;  Location: [place] CV LAB;  Service: Cardiovascular;  Laterality: N/A;  . LEFT HEART VENTRICULOGRAPHY Left 05/14/2016   PR CATH PLACE/ CORON ANGIO, IMG SUPER/ INTERP, W   . TOTAL HIP ARTHROPLASTY Right 2013     Home Medications:  Prior to Admission medications   Medication Sig Start Date End Date Taking? Authorizing Provider  aspirin 81 MG tablet Take 1 tablet (81 mg total) by mouth daily. 12/29/16   Theora Gianotti, NP  atorvastatin (LIPITOR) 40 MG tablet Take 1 tablet (40 mg total) by mouth daily at 6 PM. 11/27/17   Rosita Fire, Wilmer Floor, PA-C  budesonide-formoterol (SYMBICORT) 80-4.5 MCG/ACT inhaler Inhale 2 puffs into the lungs 2 (two) times daily as needed for shortness of breath.    [provider]  clopidogrel (PLAVIX) 75 MG  tablet Take 75 mg by mouth daily.    [provider]  ezetimibe (ZETIA) 10 MG tablet TAKE 1 TABLET BY MOUTH ONCE (1) DAILY 11/18/17   Lyda Jester M, PA-C  hydrochlorothiazide (MICROZIDE) 12.5 MG capsule Take 12.5 mg by mouth daily.    [provider]  isosorbide mononitrate (IMDUR) 30 MG 24 hr tablet Take 1 tablet (30 mg total) by mouth daily. KEEP OV. 11/06/17   Lyda Jester M, PA-C  losartan (COZAAR) 50 MG tablet Take 50 mg by mouth daily. 08/10/14   [provider]  meclizine (ANTIVERT) 25 MG tablet Take 1 tablet (25 mg total) by mouth 3 (three) times daily as needed for dizziness. 11/27/17   Lyda Jester M, PA-C  metoprolol tartrate (LOPRESSOR) 25 MG tablet Take 1 tablet (25 mg total) by mouth 2 (two) times daily. Please make overdue appt with Dr. Irish Lack before anymore refills. 2nd attempt 06/24/18   Lyda Jester M, PA-C  nicotine (NICODERM CQ - DOSED IN MG/24 HOURS) 14 mg/24hr patch Place 1  patch (14 mg total) onto the skin daily. 12/29/16   Theora Gianotti, NP  nitroGLYCERIN (NITROSTAT) 0.4 MG SL tablet Place 1 tablet (0.4 mg total) under the tongue as needed for chest pain. X 3 doses 12/29/16   Theora Gianotti, NP    Inpatient Medications: Scheduled Meds: . metoprolol tartrate  25 mg Oral BID  . nicotine  21 mg Transdermal QHS   Continuous Infusions: . heparin 1,300 Units/hr (07/06/18 0500)  . nitroGLYCERIN 6 mcg/min (07/05/18 2351)   PRN Meds: acetaminophen, morphine injection, ondansetron (ZOFRAN) IV  Allergies:    Allergies  Allergen Reactions  . Ciprofloxacin Hcl Nausea Only    Wife reported  . Codeine Other (See Comments)    Other reaction(s): GI Upset (intolerance) Pt not sure  . Hydrocodone-Acetaminophen Other (See Comments)    Other reaction(s): GI Upset (intolerance)  . Lisinopril     Wife reported  . Meclizine     Wife reported  . Metronidazole Nausea Only    Wife reported  . Pantoprazole      Wife reported  . Ranitidine     Wife reported  . Simvastatin Other (See Comments)    Other reaction(s): GI Upset (intolerance)  . Influenza Vaccines Nausea And Vomiting    Social History:   Social History   Socioeconomic History  . Marital status: Married    Spouse name: Katharine Look  . Number of children: 3  . Years of education: 9  . Highest education level: Not on file  Occupational History    Comment: truck driver, retired  Scientific laboratory technician  . Financial resource strain: Not on file  . Food insecurity:    Worry: Not on file    Inability: Not on file  . Transportation needs:    Medical: Not on file    Non-medical: Not on file  Tobacco Use  . Smoking status: Current Every Day Smoker    Packs/day: 0.25    Years: 56.00    Pack years: 14.00    Types: Cigarettes  . Smokeless tobacco: Never Used  . Tobacco comment: got paper at [place]   Substance and Sexual Activity  . Alcohol use: No  . Drug use: Not Currently  . Sexual activity: Yes    Birth control/protection: Other-see comments  Lifestyle  . Physical activity:    Days per week: Not on file    Minutes per session: Not on file  . Stress: Not on file  Relationships  . Social connections:    Talks on phone: Not on file    Gets together: Not on file    Attends religious service: Not on file    Active member of club or organization: Not on file    Attends meetings of clubs or organizations: Not on file    Relationship status: Not on file  . Intimate partner violence:    Fear of current or ex partner: Not on file    Emotionally abused: Not on file    Physically abused: Not on file    Forced sexual activity: Not on file  Other Topics Concern  . Not on file  Social History Narrative   Lives at home with family. Works as a Administrator. Independent otherwise   Caffeine- 2 cups daily    Family History:    Family History  Problem Relation Age of Onset  . Kidney failure Mother   . Hypertension Sister   . Hypertension  Brother   . Hypertension Brother  ROS:  Please see the history of present illness.   All other ROS reviewed and negative.     Physical Exam/Data:   Vitals:   07/06/18 0000 07/06/18 0500 07/06/18 0744 07/06/18 0927  BP: 140/77 120/65 (!) 111/56 134/71  Pulse: (!) 55   (!) 59  Resp: (!) 8     Temp: 98 F (36.7 C) 97.7 F (36.5 C) 97.6 F (36.4 C)   TempSrc: Oral Oral Oral   SpO2: 99%  98%     Intake/Output Summary (Last 24 hours) at 07/06/2018 1153 Last data filed at 07/06/2018 0758 Gross per 24 hour  Intake 638.77 ml  Output 2720 ml  Net -2081.23 ml   There were no vitals filed for this visit. There is no height or weight on file to calculate BMI.  General:  Well nourished, well developed, in no acute distress HEENT: normal Lymph: no adenopathy Neck: no JVD Endocrine:  No thryomegaly Vascular: No carotid bruits; FA pulses 2+ bilaterally without bruits  Cardiac:  normal S1, S2; RRR; no murmur  Lungs:  clear to auscultation bilaterally, no wheezing, rhonchi or rales  Abd: soft, nontender, no hepatomegaly  Ext: no edema Musculoskeletal:  No deformities, BUE and BLE strength normal and equal Skin: warm and dry  Neuro:  CNs 2-12 intact, no focal abnormalities noted Psych:  Normal affect   EKG:  The EKG was personally reviewed and demonstrates:  EKG obtained at [facility] normal sinus rhythm without significant ST-T wave changes. Telemetry:  Telemetry was personally reviewed and demonstrates: Normal sinus rhythm without ventricular ectopy  Relevant CV Studies:  Echo 11/26/2017 LV EF: 50% -   55% Study Conclusions  - Left ventricle: The cavity size was mildly dilated. Wall   thickness was normal. Systolic function was normal. The estimated   ejection fraction was in the range of 50% to 55%. Wall motion was   normal; there were no regional wall motion abnormalities. Doppler   parameters are consistent with abnormal left ventricular   relaxation (grade 1  diastolic dysfunction). Doppler parameters   are consistent with high ventricular filling pressure. - Mitral valve: Calcified annulus. There was mild regurgitation.  Impressions:  - Normal LV systolic function; mild diastolic dysfunction; mild   LVE; mild MR.   Non-stenotic 2nd Diag lesion previously treated.  Ost 2nd Diag to 2nd Diag lesion is 30% stenosed.  Ost 2nd Mrg lesion is 100% stenosed.  Acute Mrg lesion is 30% stenosed.  LIMA.  Origin lesion is 100% stenosed.  RIMA graft was visualized by angiography and is normal in caliber.  Ost LM lesion is 45% stenosed.  Mid Cx to Dist Cx lesion is 70% stenosed.  Previously placed Ost LAD to Prox LAD stent (unknown type) is widely patent.  The graft exhibits no disease.  Non-stenotic Mid Graft lesion.  The left ventricular systolic function is normal.  LV end diastolic pressure is normal.  The left ventricular ejection fraction is 55-65% by visual estimate.   1. 2 vessel obstructive CAD 2. Patent LIMA to the OM 3. Patent RIMA to the diagonal 4. Occluded SVG to the RCA 5. Good LV function 6. Normal LVEDP  Plan: recommend continued medical therapy. The stent at the ostium of the LAD is widely patent. There is a moderate stenosis in the distal LCx but this is a fairly small vessel and appears similar to prior studies.   Laboratory Data:  ChemistryNo results for input(s): NA, K, CL, CO2, GLUCOSE, BUN, CREATININE, CALCIUM, GFRNONAA,  GFRAA, ANIONGAP in the last 168 hours.  No results for input(s): PROT, ALBUMIN, AST, ALT, ALKPHOS, BILITOT in the last 168 hours. Hematology Recent Labs  Lab 07/06/18 0233  WBC 11.0*  RBC 3.27*  HGB 8.8*  HCT 28.8*  MCV 88.1  MCH 26.9  MCHC 30.6  RDW 18.5*  PLT 307   Cardiac Enzymes Recent Labs  Lab 07/05/18 1846 07/05/18 2109 07/06/18 0011  TROPONINI <0.03 <0.03 <0.03   No results for input(s): TROPIPOC in the last 168 hours.  BNPNo results for input(s): BNP, PROBNP  in the last 168 hours.  DDimer No results for input(s): DDIMER in the last 168 hours.  Radiology/Studies:  No results found.  Assessment and Plan:   1. Unstable angina: Serial troponin negative x4.  Symptom has been progressively worsening for the past few weeks.  Last cardiac catheterization in April 2019 showed two-vessel disease with patent LIMA to OM and SVG to diagonal, occluded SVG to RCA.  He had a moderate disease in the distal left circumflex which was a small vessel.  He was noncompliant with cardiac medication and is self discontinued all medication except for metoprolol for the past 39-month.  However even after reinitiating medications, he continued to be symptomatic.  At this point, option is quite limited as his symptom is reminiscent of the previous angina.  Further complicating the issue is his recent diagnosis of gastric ulcer and worsening anemia.  If he does have severe blockage, may need to consider bare-metal stenting instead.   - We [place] Alexa proceed with cardiac catheterization  - Risk and benefit of procedure explained to the patient who display clear understanding and agree to proceed.  Discussed with patient possible procedural risk include bleeding, vascular injury, renal injury, arrythmia, MI, stroke and loss of limb or life.  2. Medication noncompliance: MD had a long discussion with the patient regarding the importance of medication compliance  3. Gastric ulcer with anemia: Start on PPI  4. CAD s/p CABG: See cath report above  5. HTN: Blood pressure better controlled and now he is back on his previous blood pressure medications.  On IV nitro, Imdur held.  6. HLD with h/o statin intolerance and tobacco abuse      For questions or updates, please contact [place] Please consult [email or web address] for contact info under     Signed, Almyra Deforest, [place]  07/06/2018 11:53 AM  I have seen and examined this patient with Almyra Deforest.  Agree with above, note added to reflect  my findings.  On exam, RRR, no murmurs, lungs clear.  Patient presents to the hospital with chest pain.  He does have a history of coronary disease status post CABG.  He has been off of all of his cardiac medications aside from metoprolol as he has been diagnosed with a gastric ulcer, and his other medications made him feel poorly.  His chest pain continued despite heparin and IV nitroglycerin.  Due to that we Breunna Nordmann plan for left heart catheterization tomorrow.  Hurley Blevins M. Amunique Neyra MD 07/06/2018 3:02 PM

## 2018-07-06 NOTE — Progress Notes (Signed)
PROGRESS NOTE  [name] PPI:[id number] DOB: 08-28-1950 DOA: 07/05/2018 PCP: [name], MD  HPI/Recap of past 24 hours: [name] is a 67 y.o. male with medical history significant of coronary artery disease status post coronary artery bypass graft and multiple PCI with recent catheterization in April of this year.  Patient went to [facility] with chest pain which has been persistent.  Chest pain is consistent with his prior MI.  Discussion with cardiology was done and recommendation was to transfer patient to [facility] for elective cardiac catheterization as he is high risk for cardiac stress testing.  07/06/2018: Patient seen and examined at bedside.  No acute events overnight.  Endorses that he had been noncompliant with his aspirin and Plavix.  Denies chest pain at the time of this visitation.  Cardiology has been contacted and will see the patient in consultation.   Assessment/Plan: Principal Problem:   Unstable angina (HCC) Active Problems:   COPD (chronic obstructive pulmonary disease) (HCC)   GERD (gastroesophageal reflux disease)   Hx of CABG   Hyperlipidemia   Hypertension   Mixed dyslipidemia   Tobacco abuse  Unstable angina All 3 sets of troponin negative Cardiology has been consulted Possible heart cath Morphine and nitro as needed for chest pain  Coronary artery disease status post PCI with stenting Patient had been noncompliant with his dual antiplatelets Resume cardiac medications  Noncompliance with medical management Patient counseled on the importance of compliance  Chronic diastolic CHF Last 2D echo done on 11/26/2017 revealed LVEF 50 to 55% with grade 1 diastolic dysfunction and normal wall motion Resume cardiac medications  Tobacco use disorder Tobacco cessation counseling done at bedside Nicotine patch as needed  COPD No acute issues Continue COPD medications  Hypertension Blood pressures well controlled Resume cardiac  medications and antihypertensive medications  Hyperlipidemia Resume Lipitor and Zetia  DVT prophylaxis: Heparin drip Code Status: Full code Family Communication: No family with patient Disposition Plan: Home Consults called: Cardiology Admission status: Inpatient       Objective: Vitals:   07/06/18 0500 07/06/18 0744 07/06/18 0927 07/06/18 1227  BP: 120/65 (!) 111/56 134/71   Pulse:   (!) 59   Resp:      Temp: 97.7 F (36.5 C) 97.6 F (36.4 C)  97.7 F (36.5 C)  TempSrc: Oral Oral  Oral  SpO2:  98%      Intake/Output Summary (Last 24 hours) at 07/06/2018 1348 Last data filed at 07/06/2018 0758 Gross per 24 hour  Intake 638.77 ml  Output 2720 ml  Net -2081.23 ml   There were no vitals filed for this visit.  Exam:  . General: 67 y.o. year-old male well developed well nourished in no acute distress.  Alert and oriented x3. . Cardiovascular: Regular rate and rhythm with no rubs or gallops.  No thyromegaly or JVD noted.   Marland Kitchen Respiratory: Clear to auscultation with no wheezes or rales. Good inspiratory effort. . Abdomen: Soft nontender nondistended with normal bowel sounds x4 quadrants. . Musculoskeletal: Trace lower extremity edema. 2/4 pulses in all 4 extremities. . Skin: No ulcerative lesions noted or rashes, . Psychiatry: Mood is appropriate for condition and setting   Data Reviewed: CBC: Recent Labs  Lab 07/06/18 0233  WBC 11.0*  HGB 8.8*  HCT 28.8*  MCV 88.1  PLT 063   Basic Metabolic Panel: No results for input(s): NA, K, CL, CO2, GLUCOSE, BUN, CREATININE, CALCIUM, MG, PHOS in the last 168 hours. GFR: CrCl  cannot be calculated (Patient's most recent lab result is older than the maximum 21 days allowed.). Liver Function Tests: No results for input(s): AST, ALT, ALKPHOS, BILITOT, PROT, ALBUMIN in the last 168 hours. No results for input(s): LIPASE, AMYLASE in the last 168 hours. No results for input(s): AMMONIA in the last 168 hours. Coagulation  Profile: No results for input(s): INR, PROTIME in the last 168 hours. Cardiac Enzymes: Recent Labs  Lab 07/05/18 1846 07/05/18 2109 07/06/18 0011  TROPONINI <0.03 <0.03 <0.03   BNP (last 3 results) No results for input(s): PROBNP in the last 8760 hours. HbA1C: No results for input(s): HGBA1C in the last 72 hours. CBG: No results for input(s): GLUCAP in the last 168 hours. Lipid Profile: No results for input(s): CHOL, HDL, LDLCALC, TRIG, CHOLHDL, LDLDIRECT in the last 72 hours. Thyroid Function Tests: No results for input(s): TSH, T4TOTAL, FREET4, T3FREE, THYROIDAB in the last 72 hours. Anemia Panel: Recent Labs    07/06/18 0738  FERRITIN 22*  TIBC 337  IRON 61   Urine analysis:    Component Value Date/Time   COLORURINE YELLOW 02/14/2007 1236   APPEARANCEUR CLEAR 02/14/2007 1236   LABSPEC 1.016 02/14/2007 1236   PHURINE 8.0 02/14/2007 1236   GLUCOSEU NEGATIVE 02/14/2007 1236   HGBUR NEGATIVE 02/14/2007 1236   BILIRUBINUR NEGATIVE 02/14/2007 1236   KETONESUR NEGATIVE 02/14/2007 1236   PROTEINUR NEGATIVE 02/14/2007 1236   UROBILINOGEN 0.2 02/14/2007 1236   NITRITE NEGATIVE 02/14/2007 1236   LEUKOCYTESUR  02/14/2007 1236    NEGATIVE MICROSCOPIC NOT DONE ON URINES WITH NEGATIVE PROTEIN, BLOOD, LEUKOCYTES, NITRITE, OR GLUCOSE <1000 mg/dL.   Sepsis Labs: @LABRCNTIP (procalcitonin:4,lacticidven:4)  ) Recent Results (from the past 240 hour(s))  MRSA PCR Screening     Status: None   Collection Time: 07/05/18  6:11 PM  Result Value Ref Range Status   MRSA by PCR NEGATIVE NEGATIVE Final    Comment:        The GeneXpert MRSA Assay (FDA approved for NASAL specimens only), is one component of a comprehensive MRSA colonization surveillance program. It is not intended to diagnose MRSA infection nor to guide or monitor treatment for MRSA infections. Performed at [facility], [place] [address], [place], [place] [address]       Studies: No results  found.  Scheduled Meds: . atorvastatin  40 mg Oral q1800  . ezetimibe  10 mg Oral Daily  . losartan  25 mg Oral Daily  . metoprolol tartrate  25 mg Oral BID  . nicotine  21 mg Transdermal QHS    Continuous Infusions: . heparin 1,400 Units/hr (07/06/18 1328)  . nitroGLYCERIN 6 mcg/min (07/05/18 2351)     LOS: 1 day     Kayleen Memos, MD [facility] Pager [phone]  If 7PM-7AM, please contact night-coverage [email or web address] Password [email or web address] 07/06/2018, 1:48 PM

## 2018-07-07 ENCOUNTER — Encounter (HOSPITAL_COMMUNITY)
Admission: AD | Disposition: A | Discharge status: Home / Self Care | Payer: Self-pay | Source: Other Acute Inpatient Hospital | Attending: Internal Medicine

## 2018-07-07 DIAGNOSIS — E782 Mixed hyperlipidemia: Secondary | ICD-10-CM

## 2018-07-07 DIAGNOSIS — I25110 Atherosclerotic heart disease of native coronary artery with unstable angina pectoris: Principal | ICD-10-CM

## 2018-07-07 DIAGNOSIS — D649 Anemia, unspecified: Secondary | ICD-10-CM

## 2018-07-07 DIAGNOSIS — Z951 Presence of aortocoronary bypass graft: Secondary | ICD-10-CM

## 2018-07-07 HISTORY — PX: LEFT HEART CATH AND CORS/GRAFTS ANGIOGRAPHY: CATH118250

## 2018-07-07 LAB — CBC
HCT: 39.6 % (ref 39.0–52.0)
HCT: 40.9 % (ref 39.0–52.0)
Hemoglobin: 13 g/dL (ref 13.0–17.0)
Hemoglobin: 13.2 g/dL (ref 13.0–17.0)
MCH: 28.7 pg (ref 26.0–34.0)
MCH: 28.4 pg (ref 26.0–34.0)
MCHC: 32.8 g/dL (ref 30.0–36.0)
MCHC: 32.3 g/dL (ref 30.0–36.0)
MCV: 87.4 fL (ref 80.0–100.0)
MCV: 88.1 fL (ref 80.0–100.0)
PLATELETS: 182 10*3/uL (ref 150–400)
Platelets: 184 10*3/uL (ref 150–400)
RBC: 4.53 MIL/uL (ref 4.22–5.81)
RBC: 4.64 MIL/uL (ref 4.22–5.81)
RDW: 14.4 % (ref 11.5–15.5)
RDW: 14.3 % (ref 11.5–15.5)
WBC: 7.6 10*3/uL (ref 4.0–10.5)
WBC: 7.5 10*3/uL (ref 4.0–10.5)
nRBC: 0 % (ref 0.0–0.2)

## 2018-07-07 LAB — HEPARIN LEVEL (UNFRACTIONATED): Heparin Unfractionated: 0.37 IU/mL (ref 0.30–0.70)

## 2018-07-07 LAB — BASIC METABOLIC PANEL
Anion gap: 10 (ref 5–15)
BUN: 15 mg/dL (ref 8–23)
CO2: 25 mmol/L (ref 22–32)
Calcium: 8.8 mg/dL — ABNORMAL LOW (ref 8.9–10.3)
Chloride: 101 mmol/L (ref 98–111)
Creatinine, Ser: 1.33 mg/dL — ABNORMAL HIGH (ref 0.61–1.24)
GFR calc Af Amer: >60 mL/min (ref >60)
GFR calc non Af Amer: 55 mL/min — ABNORMAL LOW (ref >60)
Glucose, Bld: 117 mg/dL — ABNORMAL HIGH (ref 70–99)
Potassium: 4.2 mmol/L (ref 3.5–5.1)
SODIUM: 136 mmol/L (ref 135–145)

## 2018-07-07 SURGERY — LEFT HEART CATH AND CORS/GRAFTS ANGIOGRAPHY
Anesthesia: LOCAL

## 2018-07-07 MED ORDER — OMEPRAZOLE 20 MG PO CPDR
40.0000 mg | DELAYED_RELEASE_CAPSULE | Freq: Every day | ORAL | Status: DC
  Administered 2018-07-08 – 2018-07-09 (×2): 40 mg via ORAL
  Filled 2018-07-07 (×3): qty 2

## 2018-07-07 MED ORDER — MIDAZOLAM HCL 2 MG/2ML IJ SOLN
INTRAMUSCULAR | Status: DC | PRN
  Administered 2018-07-07 (×2): 1 mg via INTRAVENOUS

## 2018-07-07 MED ORDER — CLOPIDOGREL BISULFATE 75 MG PO TABS
75.0000 mg | ORAL_TABLET | Freq: Every day | ORAL | Status: DC
  Administered 2018-07-07 – 2018-07-09 (×3): 75 mg via ORAL
  Filled 2018-07-07 (×3): qty 1

## 2018-07-07 MED ORDER — FERROUS SULFATE 325 (65 FE) MG PO TABS
325.0000 mg | ORAL_TABLET | Freq: Every day | ORAL | Status: DC
  Administered 2018-07-08 – 2018-07-09 (×2): 325 mg via ORAL
  Filled 2018-07-07 (×2): qty 1

## 2018-07-07 MED ORDER — SODIUM CHLORIDE 0.9 % IV SOLN: 250.0000 mL | INTRAVENOUS | Status: DC | PRN

## 2018-07-07 MED ORDER — SODIUM CHLORIDE 0.9% FLUSH: 3.0000 mL | INTRAVENOUS | Status: DC | PRN

## 2018-07-07 MED ORDER — HEPARIN (PORCINE) IN NACL 1000-0.9 UT/500ML-% IV SOLN
INTRAVENOUS | Status: DC | PRN
  Administered 2018-07-07 (×2): 500 mL

## 2018-07-07 MED ORDER — FENTANYL CITRATE (PF) 100 MCG/2ML IJ SOLN
INTRAMUSCULAR | Status: DC | PRN
  Administered 2018-07-07 (×2): 25 ug via INTRAVENOUS

## 2018-07-07 MED ORDER — LIDOCAINE HCL (PF) 1 % IJ SOLN
INTRAMUSCULAR | Status: DC | PRN
  Administered 2018-07-07: 8 mL

## 2018-07-07 MED ORDER — MIDAZOLAM HCL 2 MG/2ML IJ SOLN
INTRAMUSCULAR | Status: AC
  Filled 2018-07-07: qty 2

## 2018-07-07 MED ORDER — ISOSORBIDE MONONITRATE ER 30 MG PO TB24
30.0000 mg | ORAL_TABLET | Freq: Every day | ORAL | Status: DC
  Administered 2018-07-07 – 2018-07-09 (×3): 30 mg via ORAL
  Filled 2018-07-07 (×3): qty 1

## 2018-07-07 MED ORDER — FENTANYL CITRATE (PF) 100 MCG/2ML IJ SOLN
INTRAMUSCULAR | Status: AC
  Filled 2018-07-07: qty 2

## 2018-07-07 MED ORDER — HEPARIN (PORCINE) IN NACL 1000-0.9 UT/500ML-% IV SOLN
INTRAVENOUS | Status: AC
  Filled 2018-07-07: qty 1000

## 2018-07-07 MED ORDER — SODIUM CHLORIDE 0.9% FLUSH
3.0000 mL | Freq: Two times a day (BID) | INTRAVENOUS | Status: DC
  Administered 2018-07-07 – 2018-07-08 (×3): 3 mL via INTRAVENOUS

## 2018-07-07 MED ORDER — HEPARIN SODIUM (PORCINE) 5000 UNIT/ML IJ SOLN
5000.0000 [IU] | Freq: Three times a day (TID) | INTRAMUSCULAR | Status: DC
  Administered 2018-07-08 – 2018-07-09 (×3): 5000 [IU] via SUBCUTANEOUS
  Filled 2018-07-07 (×4): qty 1

## 2018-07-07 MED ORDER — SODIUM CHLORIDE 0.9 % IV SOLN: INTRAVENOUS | Status: AC

## 2018-07-07 MED ORDER — ASPIRIN 81 MG PO CHEW
81.0000 mg | CHEWABLE_TABLET | Freq: Every day | ORAL | Status: DC
  Administered 2018-07-08 – 2018-07-09 (×2): 81 mg via ORAL
  Filled 2018-07-07 (×2): qty 1

## 2018-07-07 MED ORDER — LIDOCAINE HCL (PF) 1 % IJ SOLN
INTRAMUSCULAR | Status: AC
  Filled 2018-07-07: qty 30

## 2018-07-07 MED ORDER — IOHEXOL 350 MG/ML SOLN
INTRAVENOUS | Status: DC | PRN
  Administered 2018-07-07: 85 mL via INTRA_ARTERIAL

## 2018-07-07 SURGICAL SUPPLY — 12 items
CATH INFINITI 5 FR IM (CATHETERS) ×2 IMPLANT
CATH INFINITI 5FR MULTPACK ANG (CATHETERS) ×2 IMPLANT
KIT HEART LEFT (KITS) ×2 IMPLANT
KIT MICROPUNCTURE NIT STIFF (SHEATH) ×2 IMPLANT
PACK CARDIAC CATHETERIZATION (CUSTOM PROCEDURE TRAY) ×2 IMPLANT
SHEATH PINNACLE 5F 10CM (SHEATH) ×2 IMPLANT
SHEATH PROBE COVER 6X72 (BAG) ×2 IMPLANT
SYR MEDRAD MARK 7 150ML (SYRINGE) ×2 IMPLANT
TRANSDUCER W/STOPCOCK (MISCELLANEOUS) ×2 IMPLANT
TUBING CIL FLEX 10 FLL-RA (TUBING) ×2 IMPLANT
WIRE EMERALD 3MM-J .035X150CM (WIRE) ×2 IMPLANT
WIRE HI TORQ VERSACORE-J 145CM (WIRE) ×2 IMPLANT

## 2018-07-07 NOTE — Progress Notes (Signed)
Progress Note  Patient Name: [name] Date of Encounter: 07/07/2018  Primary Cardiologist: Larae Grooms, MD   Subjective   67 year old admitted 2 days ago with unstable angina.  He is currently pain-free on IV nitroglycerin.  Enzymes are negative and his EKG shows no acute changes.  He is scheduled for cardiac catheterization today.  He has had bypass surgery in the past as well.  Inpatient Medications    Scheduled Meds: . [START ON 07/08/2018] aspirin  81 mg Oral Daily  . atorvastatin  40 mg Oral q1800  . ezetimibe  10 mg Oral Daily  . losartan  25 mg Oral Daily  . metoprolol tartrate  25 mg Oral BID  . nicotine  21 mg Transdermal QHS  . sodium chloride flush  3 mL Intravenous Q12H   Continuous Infusions: . sodium chloride    . sodium chloride 1 mL/kg/hr (07/07/18 0534)  . heparin 1,300 Units/hr (07/07/18 0534)  . nitroGLYCERIN 10 mcg/min (07/07/18 0000)   PRN Meds: sodium chloride, acetaminophen, morphine injection, ondansetron (ZOFRAN) IV, sodium chloride flush   Vital Signs    Vitals:   07/07/18 0300 07/07/18 0400 07/07/18 0500 07/07/18 0600  BP: (!) 92/57 (!) 113/46 (!) 89/40 (!) 90/45  Pulse: 63 (!) 54 (!) 45 (!) 51  Resp: (!) 8 (!) 0 11 12  Temp: 98.7 F (37.1 C)     TempSrc: Oral     SpO2: 98% 93% 95% 94%  Weight:        Intake/Output Summary (Last 24 hours) at 07/07/2018 0842 Last data filed at 07/07/2018 0600 Gross per 24 hour  Intake 844.04 ml  Output 650 ml  Net 194.04 ml   Filed Weights   07/06/18 1900  Weight: 102 kg    Telemetry    Sinus rhythm- Personally Reviewed  ECG    Not performed today- Personally Reviewed  Physical Exam   GEN: No acute distress.   Neck: No JVD Cardiac: RRR, no murmurs, rubs, or gallops.  Respiratory: Clear to auscultation bilaterally. GI: Soft, nontender, non-distended  MS: No edema; No deformity. Neuro:  Nonfocal  Psych: Normal affect   Labs    Chemistry Recent Labs  Lab 07/07/18 0204  NA  136  K 4.2  CL 101  CO2 25  GLUCOSE 117*  BUN 15  CREATININE 1.33*  CALCIUM 8.8*  GFRNONAA 55*  GFRAA >60  ANIONGAP 10     Hematology Recent Labs  Lab 07/06/18 0233 07/07/18 0437  WBC 11.0* 7.6  RBC 3.27* 4.53  HGB 8.8* 13.0  HCT 28.8* 39.6  MCV 88.1 87.4  MCH 26.9 28.7  MCHC 30.6 32.8  RDW 18.5* 14.4  PLT 307 182    Cardiac Enzymes Recent Labs  Lab 07/05/18 1846 07/05/18 2109 07/06/18 0011  TROPONINI <0.03 <0.03 <0.03   No results for input(s): TROPIPOC in the last 168 hours.   BNPNo results for input(s): BNP, PROBNP in the last 168 hours.   DDimer No results for input(s): DDIMER in the last 168 hours.   Radiology    No results found.  Cardiac Studies   None  Patient Profile     67 y.o. male with a hx of CAD s/p CABG, prastate CA, HTN, HLD with h/o statin intolerance and tobacco abuse (quit 2 month ago) who was seen for the evaluation of chest pain at the request of Dr. [place] Crane. Patient is a transfer from [place]    1: Unstable angina-  history of CAD status post CABG in the past with a RIMA to a diagonal branch, LIMA to an obtuse marginal branch and an occluded vein to the RCA.  His last catheterization was April 2019 by Dr. Martinique.  Medical therapy was recommended.  He was admitted in transfer from [facility] where he was seen by Dr. Bettina Gavia.  His enzymes have all been negative.  He said no recurrent chest pain on IV nitroglycerin.  He is scheduled for cardiac catheterization this afternoon.  2: Medication noncompliance- the patient stopped all medication 6 months ago because it "made him feel bad"  3: Tobacco abuse- quit 2 months ago  4: Essential hypertension- controlled on current medications.  5: Hyperlipidemia- intolerant to statin medications  6: Anemia- history of gastric ulcer with initial hemoglobin of 8, currently 13.  There clearly is a problem with this data.  He will need a repeat CBC to assess his  hemoglobin.  We will start a PPI.Marland Kitchen       For questions or updates, please contact [place] Please consult [email or web address] for contact info under        Signed, Quay Burow, MD  07/07/2018, 8:42 AM

## 2018-07-07 NOTE — Progress Notes (Signed)
PROGRESS NOTE  [name]:[id number] DOB: 05-29-1951 DOA: 07/05/2018 PCP: Jonathan Hipps, MD  HPI/Recap of past 24 hours: [name] is a 67 y.o. male with medical history significant of coronary artery disease status post coronary artery bypass graft and multiple PCI with recent catheterization in April of this year.  Patient went to [facility] with chest pain which has been persistent.  Chest pain is consistent with his prior MI.  Discussion with cardiology was done and recommendation was to transfer patient to [facility] for elective cardiac catheterization as he is high risk for cardiac stress testing.  07/06/2018: Patient seen and examined at bedside.  No acute events overnight.  Endorses that he had been noncompliant with his aspirin and Plavix.  Denies chest pain at the time of this visitation.  Cardiology has been contacted and will see the patient in consultation.  07/07/18: Denies chest pain, palpitations or dyspnea. On nitro drip. Heart cath planned this afternoon.   Assessment/Plan: Principal Problem:   Unstable angina (HCC) Active Problems:   COPD (chronic obstructive pulmonary disease) (HCC)   GERD (gastroesophageal reflux disease)   Hx of CABG   Hyperlipidemia   Hypertension   Mixed dyslipidemia   Tobacco abuse  Unstable angina All 3 sets of troponin negative Cardiology has been consulted Possible heart cath this afternoon Morphine and nitro as needed for chest pain He is on nitro drip.  Coronary artery disease status post PCI with stenting Patient had been noncompliant with his dual antiplatelets x 6 months Resume cardiac medications  Noncompliance with medical management Patient counseled on the importance of compliance  Chronic normocytic anemia/ iron deficiency anemia Iron studies done on 07/06/18 Hg 13 from 8. Suspect error from lab. Repeat cbc  Ferrous sulfate 325 mg daily  Chronic diastolic CHF Last 2D echo done on 11/26/2017 revealed LVEF  50 to 55% with grade 1 diastolic dysfunction and normal wall motion Resume cardiac medications  Tobacco use disorder Tobacco cessation counseling done at bedside Nicotine patch as needed  COPD No acute issues Continue COPD medications  Hypertension Blood pressures well controlled Resume cardiac medications and antihypertensive medications  Hyperlipidemia Resume Lipitor and Zetia  DVT prophylaxis: Heparin drip Code Status: Full code Family Communication: No family with patient Disposition Plan: Home Consults called: Cardiology Admission status: Inpatient       Objective: Vitals:   07/07/18 0300 07/07/18 0400 07/07/18 0500 07/07/18 0600  BP: (!) 92/57 (!) 113/46 (!) 89/40 (!) 90/45  Pulse: 63 (!) 54 (!) 45 (!) 51  Resp: (!) 8 (!) 0 11 12  Temp: 98.7 F (37.1 C)     TempSrc: Oral     SpO2: 98% 93% 95% 94%  Weight:        Intake/Output Summary (Last 24 hours) at 07/07/2018 0934 Last data filed at 07/07/2018 0600 Gross per 24 hour  Intake 844.04 ml  Output 650 ml  Net 194.04 ml   Filed Weights   07/06/18 1900  Weight: 102 kg    Exam:  . General: 67 y.o. year-old male WD WN NAD A&O x 3 . Cardiovascular: RRR no rubs or gallops. No JVD or thyromegaly. Marland Kitchen Respiratory: CTA no wheezes or rales. Good inspiratory efforts. . Abdomen: Soft nontender nondistended with normal bowel sounds x4 quadrants. . Musculoskeletal: Trace lower extremity edema. 2/4 pulses in all 4 extremities. . Skin: No ulcerative lesions noted or rashes . Psychiatry: Mood is appropriate for condition and setting   Data Reviewed: CBC:  Recent Labs  Lab 07/06/18 0233 07/07/18 0437  WBC 11.0* 7.6  HGB 8.8* 13.0  HCT 28.8* 39.6  MCV 88.1 87.4  PLT 307 656   Basic Metabolic Panel: Recent Labs  Lab 07/07/18 0204  NA 136  K 4.2  CL 101  CO2 25  GLUCOSE 117*  BUN 15  CREATININE 1.33*  CALCIUM 8.8*   GFR: CrCl cannot be calculated (Unknown ideal weight.). Liver Function Tests: No  results for input(s): AST, ALT, ALKPHOS, BILITOT, PROT, ALBUMIN in the last 168 hours. No results for input(s): LIPASE, AMYLASE in the last 168 hours. No results for input(s): AMMONIA in the last 168 hours. Coagulation Profile: No results for input(s): INR, PROTIME in the last 168 hours. Cardiac Enzymes: Recent Labs  Lab 07/05/18 1846 07/05/18 2109 07/06/18 0011  TROPONINI <0.03 <0.03 <0.03   BNP (last 3 results) No results for input(s): PROBNP in the last 8760 hours. HbA1C: No results for input(s): HGBA1C in the last 72 hours. CBG: No results for input(s): GLUCAP in the last 168 hours. Lipid Profile: No results for input(s): CHOL, HDL, LDLCALC, TRIG, CHOLHDL, LDLDIRECT in the last 72 hours. Thyroid Function Tests: No results for input(s): TSH, T4TOTAL, FREET4, T3FREE, THYROIDAB in the last 72 hours. Anemia Panel: Recent Labs    07/06/18 0738  FERRITIN 22*  TIBC 337  IRON 61   Urine analysis:    Component Value Date/Time   COLORURINE YELLOW 02/14/2007 1236   APPEARANCEUR CLEAR 02/14/2007 1236   LABSPEC 1.016 02/14/2007 1236   PHURINE 8.0 02/14/2007 1236   GLUCOSEU NEGATIVE 02/14/2007 1236   HGBUR NEGATIVE 02/14/2007 1236   BILIRUBINUR NEGATIVE 02/14/2007 1236   KETONESUR NEGATIVE 02/14/2007 1236   PROTEINUR NEGATIVE 02/14/2007 1236   UROBILINOGEN 0.2 02/14/2007 1236   NITRITE NEGATIVE 02/14/2007 1236   LEUKOCYTESUR  02/14/2007 1236    NEGATIVE MICROSCOPIC NOT DONE ON URINES WITH NEGATIVE PROTEIN, BLOOD, LEUKOCYTES, NITRITE, OR GLUCOSE <1000 mg/dL.   Sepsis Labs: @LABRCNTIP (procalcitonin:4,lacticidven:4)  ) Recent Results (from the past 240 hour(s))  MRSA PCR Screening     Status: None   Collection Time: 07/05/18  6:11 PM  Result Value Ref Range Status   MRSA by PCR NEGATIVE NEGATIVE Final    Comment:        The GeneXpert MRSA Assay (FDA approved for NASAL specimens only), is one component of a comprehensive MRSA colonization surveillance program. It is  not intended to diagnose MRSA infection nor to guide or monitor treatment for MRSA infections. Performed at [facility], [place] [address] [place], [place] [address]       Studies: No results found.  Scheduled Meds: . [START ON 07/08/2018] aspirin  81 mg Oral Daily  . atorvastatin  40 mg Oral q1800  . ezetimibe  10 mg Oral Daily  . losartan  25 mg Oral Daily  . metoprolol tartrate  25 mg Oral BID  . nicotine  21 mg Transdermal QHS  . omeprazole  40 mg Oral Daily  . sodium chloride flush  3 mL Intravenous Q12H    Continuous Infusions: . sodium chloride    . sodium chloride 1 mL/kg/hr (07/07/18 0534)  . heparin 1,300 Units/hr (07/07/18 0534)  . nitroGLYCERIN 10 mcg/min (07/07/18 0000)     LOS: 2 days     Kayleen Memos, MD [facility] Pager [phone]  If 7PM-7AM, please contact night-coverage [email or web address] Password [facility] 07/07/2018, 9:34 AM

## 2018-07-07 NOTE — Progress Notes (Signed)
ANTICOAGULATION CONSULT NOTE - Follow up consult  Pharmacy Consult for heparin  Indication: chest pain/ACS  Allergies  Allergen Reactions  . Ciprofloxacin Hcl Nausea Only    Wife reported  . Codeine Other (See Comments)    Other reaction(s): GI Upset (intolerance) Pt not sure  . Hydrocodone-Acetaminophen Other (See Comments)    Other reaction(s): GI Upset (intolerance)  . Lisinopril     Wife reported  . Meclizine     Wife reported  . Metronidazole Nausea Only    Wife reported  . Pantoprazole     Wife reported  . Ranitidine     Wife reported  . Simvastatin Other (See Comments)    Other reaction(s): GI Upset (intolerance)  . Influenza Vaccines Nausea And Vomiting    Vital Signs: Temp: 98.7 F (37.1 C) (12/02 0300) Temp Source: Oral (12/02 0300) BP: 90/45 (12/02 0600) Pulse Rate: 51 (12/02 0600)     Labs: Recent Labs    07/05/18 1846 07/05/18 2109 07/06/18 0011  07/06/18 0233 07/06/18 0952 07/06/18 1757 07/07/18 0204 07/07/18 0437  HGB  --   --   --   --  8.8*  --   --   --  13.0  HCT  --   --   --   --  28.8*  --   --   --  39.6  PLT  --   --   --   --  307  --   --   --  182  HEPARINUNFRC  --   --   --    < >  --  0.27* 0.84* 0.37  --   CREATININE  --   --   --   --   --   --   --  1.33*  --   TROPONINI <0.03 <0.03 <0.03  --   --   --   --   --   --    < > = values in this interval not displayed.    CrCl cannot be calculated (Unknown ideal weight.).   Medical History: Past Medical History:  Diagnosis Date  . Arthritis   . Asthma   . Cancer [facility])    history of prostate cancer- cryoablation  . Cerebrovascular disease   . COPD (chronic obstructive pulmonary disease) ([place])   . Coronary artery disease    a. 2008 s/p CABG x 3 [facility]): LIMA->OM, RIMA->Diag, VG->RPDA;  b. 05/2016 NSTEMI/PCI: DES ->prox LAD and ost diag;  c. 12/2016 Cath/PCI: LM 95d, LAD 95ost (2.75x8 Promus Premier DES), patent prox stent, D2 30, LCX nl, OM2 95, RCA min irregs,  LIMA->OM2 ok, RIMA->D2 85 "kink"-improved w/ IC NTG, VG->RPDA 100.  Marland Kitchen Dyspnea   . GERD (gastroesophageal reflux disease)   . Hyperlipidemia   . Hypertension   . Mitral regurgitation    a. mod by cath 12/2016.  . Statin intolerance   . Stroke ([place])   . Syncope 02/2017  . Tobacco abuse     Medications:  Medications Prior to Admission  Medication Sig Dispense Refill Last Dose  . aspirin 81 MG tablet Take 1 tablet (81 mg total) by mouth daily.   11/26/2017 at 1100  . atorvastatin (LIPITOR) 40 MG tablet Take 1 tablet (40 mg total) by mouth daily at 6 PM. 30 tablet 5   . budesonide-formoterol (SYMBICORT) 80-4.5 MCG/ACT inhaler Inhale 2 puffs into the lungs 2 (two) times daily as needed for shortness of breath.   11/24/2017 at unk  .  clopidogrel (PLAVIX) 75 MG tablet Take 75 mg by mouth daily.   11/26/2017 at 1100  . ezetimibe (ZETIA) 10 MG tablet TAKE 1 TABLET BY MOUTH ONCE (1) DAILY 30 tablet 6 11/26/2017 at 1100  . hydrochlorothiazide (MICROZIDE) 12.5 MG capsule Take 12.5 mg by mouth daily.   11/26/2017 at 1100  . isosorbide mononitrate (IMDUR) 30 MG 24 hr tablet Take 1 tablet (30 mg total) by mouth daily. KEEP OV. 90 tablet 0 11/26/2017 at 1100  . losartan (COZAAR) 50 MG tablet Take 50 mg by mouth daily.   11/26/2017 at 1100  . meclizine (ANTIVERT) 25 MG tablet Take 1 tablet (25 mg total) by mouth 3 (three) times daily as needed for dizziness. 30 tablet 0   . metoprolol tartrate (LOPRESSOR) 25 MG tablet Take 1 tablet (25 mg total) by mouth 2 (two) times daily. Please make overdue appt with Dr. Irish Lack before anymore refills. 2nd attempt 30 tablet 0   . nicotine (NICODERM CQ - DOSED IN MG/24 HOURS) 14 mg/24hr patch Place 1 patch (14 mg total) onto the skin daily. 30 patch 0 11/26/2017 at 1100  . nitroGLYCERIN (NITROSTAT) 0.4 MG SL tablet Place 1 tablet (0.4 mg total) under the tongue as needed for chest pain. X 3 doses 25 tablet 3 11/24/2017 at unk    Assessment: 67 yo male to begin heparin for r/o  ACS. No anticoagulants noted PTA.  -heparin level up to 0.3 on 1300 units/hr  Goal of Therapy:  Heparin level 0.3-0.7 units/ml Monitor platelets by anticoagulation protocol: Yes   Plan:  -Continue heparin at 1300 units/hr -Follow up after cath  Erin Hearing PharmD., BCPS Clinical Pharmacist 07/07/2018 8:29 AM

## 2018-07-07 NOTE — Progress Notes (Addendum)
Site area: RFA Site Prior to Removal:  Level 0 Pressure Applied For: 20 min Manual:   yes Patient Status During Pull:  stable Post Pull Site:  Level 0 Post Pull Instructions Given: yes  Post Pull Pulses Present: palpable rt DP Dressing Applied: clear  Bedrest begins @ 1725 till 2125 Comments: removed by Eddie Dibbles

## 2018-07-07 NOTE — Interval H&P Note (Signed)
History and Physical Interval Note:  07/07/2018 3:58 PM  [name]  has presented today for cardiac catheterization, with the diagnosis of unstable angina  The various methods of treatment have been discussed with the patient and family. After consideration of risks, benefits and other options for treatment, the patient has consented to  Procedure(s): LEFT HEART CATH AND CORS/GRAFTS ANGIOGRAPHY (N/A) as a surgical intervention .  The patient's history has been reviewed, patient examined, no change in status, stable for surgery.  I have reviewed the patient's chart and labs.  Questions were answered to the patient's satisfaction.    Cath Lab Visit (complete for each Cath Lab visit)  Clinical Evaluation Leading to the Procedure:   ACS: Yes.    Non-ACS:  N/A  [name]

## 2018-07-07 NOTE — H&P (View-Only) (Signed)
Progress Note  Patient Name: [name] Date of Encounter: 07/07/2018  Primary Cardiologist: Larae Grooms, MD   Subjective   67 year old admitted 2 days ago with unstable angina.  He is currently pain-free on IV nitroglycerin.  Enzymes are negative and his EKG shows no acute changes.  He is scheduled for cardiac catheterization today.  He has had bypass surgery in the past as well.  Inpatient Medications    Scheduled Meds: . [START ON 07/08/2018] aspirin  81 mg Oral Daily  . atorvastatin  40 mg Oral q1800  . ezetimibe  10 mg Oral Daily  . losartan  25 mg Oral Daily  . metoprolol tartrate  25 mg Oral BID  . nicotine  21 mg Transdermal QHS  . sodium chloride flush  3 mL Intravenous Q12H   Continuous Infusions: . sodium chloride    . sodium chloride 1 mL/kg/hr (07/07/18 0534)  . heparin 1,300 Units/hr (07/07/18 0534)  . nitroGLYCERIN 10 mcg/min (07/07/18 0000)   PRN Meds: sodium chloride, acetaminophen, morphine injection, ondansetron (ZOFRAN) IV, sodium chloride flush   Vital Signs    Vitals:   07/07/18 0300 07/07/18 0400 07/07/18 0500 07/07/18 0600  BP: (!) 92/57 (!) 113/46 (!) 89/40 (!) 90/45  Pulse: 63 (!) 54 (!) 45 (!) 51  Resp: (!) 8 (!) 0 11 12  Temp: 98.7 F (37.1 C)     TempSrc: Oral     SpO2: 98% 93% 95% 94%  Weight:        Intake/Output Summary (Last 24 hours) at 07/07/2018 0842 Last data filed at 07/07/2018 0600 Gross per 24 hour  Intake 844.04 ml  Output 650 ml  Net 194.04 ml   Filed Weights   07/06/18 1900  Weight: 102 kg    Telemetry    Sinus rhythm- Personally Reviewed  ECG    Not performed today- Personally Reviewed  Physical Exam   GEN: No acute distress.   Neck: No JVD Cardiac: RRR, no murmurs, rubs, or gallops.  Respiratory: Clear to auscultation bilaterally. GI: Soft, nontender, non-distended  MS: No edema; No deformity. Neuro:  Nonfocal  Psych: Normal affect   Labs    Chemistry Recent Labs  Lab 07/07/18 0204  NA  136  K 4.2  CL 101  CO2 25  GLUCOSE 117*  BUN 15  CREATININE 1.33*  CALCIUM 8.8*  GFRNONAA 55*  GFRAA >60  ANIONGAP 10     Hematology Recent Labs  Lab 07/06/18 0233 07/07/18 0437  WBC 11.0* 7.6  RBC 3.27* 4.53  HGB 8.8* 13.0  HCT 28.8* 39.6  MCV 88.1 87.4  MCH 26.9 28.7  MCHC 30.6 32.8  RDW 18.5* 14.4  PLT 307 182    Cardiac Enzymes Recent Labs  Lab 07/05/18 1846 07/05/18 2109 07/06/18 0011  TROPONINI <0.03 <0.03 <0.03   No results for input(s): TROPIPOC in the last 168 hours.   BNPNo results for input(s): BNP, PROBNP in the last 168 hours.   DDimer No results for input(s): DDIMER in the last 168 hours.   Radiology    No results found.  Cardiac Studies   None  Patient Profile     67 y.o. male with a hx of CAD s/p CABG, prastate CA, HTN, HLD with h/o statin intolerance and tobacco abuse (quit 2 month ago) who was seen for the evaluation of chest pain at the request of Dr. [place] Crane. Patient is a transfer from [place]    1: Unstable angina-  history of CAD status post CABG in the past with a RIMA to a diagonal branch, LIMA to an obtuse marginal branch and an occluded vein to the RCA.  His last catheterization was April 2019 by Dr. Martinique.  Medical therapy was recommended.  He was admitted in transfer from [facility] where he was seen by Dr. Bettina Gavia.  His enzymes have all been negative.  He said no recurrent chest pain on IV nitroglycerin.  He is scheduled for cardiac catheterization this afternoon.  2: Medication noncompliance- the patient stopped all medication 6 months ago because it "made him feel bad"  3: Tobacco abuse- quit 2 months ago  4: Essential hypertension- controlled on current medications.  5: Hyperlipidemia- intolerant to statin medications  6: Anemia- history of gastric ulcer with initial hemoglobin of 8, currently 13.  There clearly is a problem with this data.  He will need a repeat CBC to assess his  hemoglobin.  We will start a PPI.Marland Kitchen       For questions or updates, please contact [place] Please consult [email or web address] for contact info under        Signed, Quay Burow, MD  07/07/2018, 8:42 AM

## 2018-07-07 NOTE — Progress Notes (Signed)
Lab HGB 13 up from 8.8.  [place] lab.  Still 13.  At [facility] was 15.9.  Saunders Revel T

## 2018-07-07 NOTE — Plan of Care (Signed)
Discussed the plan of care

## 2018-07-08 ENCOUNTER — Other Ambulatory Visit: Payer: Self-pay

## 2018-07-08 ENCOUNTER — Encounter (HOSPITAL_COMMUNITY): Payer: Self-pay | Admitting: Internal Medicine

## 2018-07-08 LAB — BASIC METABOLIC PANEL
Anion gap: 11 (ref 5–15)
BUN: 14 mg/dL (ref 8–23)
CO2: 23 mmol/L (ref 22–32)
Calcium: 8.6 mg/dL — ABNORMAL LOW (ref 8.9–10.3)
Chloride: 104 mmol/L (ref 98–111)
Creatinine, Ser: 1.38 mg/dL — ABNORMAL HIGH (ref 0.61–1.24)
GFR calc Af Amer: >60 mL/min (ref >60)
GFR calc non Af Amer: 52 mL/min — ABNORMAL LOW (ref >60)
Glucose, Bld: 95 mg/dL (ref 70–99)
Potassium: 4.4 mmol/L (ref 3.5–5.1)
Sodium: 138 mmol/L (ref 135–145)

## 2018-07-08 LAB — CBC
HEMATOCRIT: 42.8 % (ref 39.0–52.0)
Hemoglobin: 13.5 g/dL (ref 13.0–17.0)
MCH: 28 pg (ref 26.0–34.0)
MCHC: 31.5 g/dL (ref 30.0–36.0)
MCV: 88.8 fL (ref 80.0–100.0)
Platelets: 185 10*3/uL (ref 150–400)
RBC: 4.82 MIL/uL (ref 4.22–5.81)
RDW: 14.2 % (ref 11.5–15.5)
WBC: 6.2 10*3/uL (ref 4.0–10.5)
nRBC: 0 % (ref 0.0–0.2)

## 2018-07-08 MED ORDER — ASPIRIN 81 MG PO TABS: 81.0000 mg | ORAL_TABLET | Freq: Every day | ORAL | 0 refills | Status: DC

## 2018-07-08 MED ORDER — CLOPIDOGREL BISULFATE 75 MG PO TABS: 75.0000 mg | ORAL_TABLET | Freq: Every day | ORAL | 0 refills | Status: DC

## 2018-07-08 MED ORDER — METOPROLOL TARTRATE 25 MG PO TABS: 25.0000 mg | ORAL_TABLET | Freq: Two times a day (BID) | ORAL | 0 refills | Status: DC

## 2018-07-08 MED ORDER — NICOTINE 14 MG/24HR TD PT24: 14.0000 mg | MEDICATED_PATCH | Freq: Every day | TRANSDERMAL | 0 refills | Status: DC

## 2018-07-08 MED ORDER — ALBUTEROL SULFATE (2.5 MG/3ML) 0.083% IN NEBU
2.5000 mg | INHALATION_SOLUTION | Freq: Once | RESPIRATORY_TRACT | Status: AC
  Administered 2018-07-08: 2.5 mg via RESPIRATORY_TRACT
  Filled 2018-07-08: qty 3

## 2018-07-08 MED ORDER — ALUM & MAG HYDROXIDE-SIMETH 200-200-20 MG/5ML PO SUSP: 30.0000 mL | ORAL | Status: DC | PRN

## 2018-07-08 MED ORDER — IPRATROPIUM-ALBUTEROL 0.5-2.5 (3) MG/3ML IN SOLN
3.0000 mL | Freq: Four times a day (QID) | RESPIRATORY_TRACT | Status: DC
  Administered 2018-07-08: 3 mL via RESPIRATORY_TRACT
  Filled 2018-07-08: qty 3

## 2018-07-08 MED ORDER — IPRATROPIUM-ALBUTEROL 0.5-2.5 (3) MG/3ML IN SOLN: 3.0000 mL | Freq: Four times a day (QID) | RESPIRATORY_TRACT | Status: DC | PRN

## 2018-07-08 MED ORDER — FERROUS SULFATE 325 (65 FE) MG PO TABS: 325.0000 mg | ORAL_TABLET | Freq: Every day | ORAL | 0 refills | Status: DC

## 2018-07-08 MED ORDER — ATORVASTATIN CALCIUM 40 MG PO TABS: 40.0000 mg | ORAL_TABLET | Freq: Every day | ORAL | 0 refills | Status: DC

## 2018-07-08 MED ORDER — ISOSORBIDE MONONITRATE ER 30 MG PO TB24: 30.0000 mg | ORAL_TABLET | Freq: Every day | ORAL | 0 refills | Status: DC

## 2018-07-08 MED ORDER — IPRATROPIUM BROMIDE 0.02 % IN SOLN
0.5000 mg | Freq: Once | RESPIRATORY_TRACT | Status: AC
  Administered 2018-07-08: 0.5 mg via RESPIRATORY_TRACT
  Filled 2018-07-08: qty 2.5

## 2018-07-08 MED ORDER — EZETIMIBE 10 MG PO TABS: ORAL_TABLET | ORAL | 6 refills | Status: DC

## 2018-07-08 MED ORDER — LOSARTAN POTASSIUM 25 MG PO TABS: 25.0000 mg | ORAL_TABLET | Freq: Every day | ORAL | 0 refills | Status: DC

## 2018-07-08 NOTE — Progress Notes (Signed)
Progress Note  Patient Name: [name] Date of Encounter: 07/08/2018  Primary Cardiologist: Larae Grooms, MD   Subjective   67 year old admitted 3 days ago with unstable angina. Enzymes are negative and his EKG shows no acute changes.  He underwent cardiac catheterization by Dr. Saunders Revel yesterday revealing a patent RIMA to diagonal branch, patent LIMA to the obtuse marginal branch and occluded RCA vein graft.  The [place] had a "kink" in its midportion but I did not think this required intervention.  There were no other "culprit" lesions.  Inpatient Medications    Scheduled Meds: . aspirin  81 mg Oral Daily  . atorvastatin  40 mg Oral q1800  . clopidogrel  75 mg Oral Daily  . ezetimibe  10 mg Oral Daily  . ferrous sulfate  325 mg Oral Q breakfast  . heparin  5,000 Units Subcutaneous Q8H  . isosorbide mononitrate  30 mg Oral Daily  . losartan  25 mg Oral Daily  . metoprolol tartrate  25 mg Oral BID  . omeprazole  40 mg Oral Daily  . sodium chloride flush  3 mL Intravenous Q12H   Continuous Infusions: . sodium chloride     PRN Meds: sodium chloride, acetaminophen, alum & mag hydroxide-simeth, ipratropium-albuterol, morphine injection, ondansetron (ZOFRAN) IV, sodium chloride flush   Vital Signs    Vitals:   07/08/18 0016 07/08/18 0251 07/08/18 0358 07/08/18 0737  BP: (!) 114/49 140/64    Pulse: (!) 53 (!) 58    Resp: 13 19    Temp: 98.3 F (36.8 C) 98.2 F (36.8 C)    TempSrc: Oral Oral    SpO2: 97% 97% 97% 96%  Weight:        Intake/Output Summary (Last 24 hours) at 07/08/2018 1026 Last data filed at 07/08/2018 0251 Gross per 24 hour  Intake 1732.65 ml  Output 950 ml  Net 782.65 ml   Filed Weights   07/06/18 1900  Weight: 102 kg    Telemetry    Sinus rhythm- Personally Reviewed  ECG    Not performed today- Personally Reviewed  Physical Exam   GEN: No acute distress.   Neck: No JVD Cardiac: RRR, no murmurs, rubs, or gallops.  Respiratory: Clear to  auscultation bilaterally. GI: Soft, nontender, non-distended  MS: No edema; No deformity. Neuro:  Nonfocal  Psych: Normal affect   Labs    Chemistry Recent Labs  Lab 07/07/18 0204 07/08/18 0251  NA 136 138  K 4.2 4.4  CL 101 104  CO2 25 23  GLUCOSE 117* 95  BUN 15 14  CREATININE 1.33* 1.38*  CALCIUM 8.8* 8.6*  GFRNONAA 55* 52*  GFRAA >60 >60  ANIONGAP 10 11     Hematology Recent Labs  Lab 07/07/18 0437 07/07/18 0948 07/08/18 0251  WBC 7.6 7.5 6.2  RBC 4.53 4.64 4.82  HGB 13.0 13.2 13.5  HCT 39.6 40.9 42.8  MCV 87.4 88.1 88.8  MCH 28.7 28.4 28.0  MCHC 32.8 32.3 31.5  RDW 14.4 14.3 14.2  PLT 182 184 185    Cardiac Enzymes Recent Labs  Lab 07/05/18 1846 07/05/18 2109 07/06/18 0011  TROPONINI <0.03 <0.03 <0.03   No results for input(s): TROPIPOC in the last 168 hours.   BNPNo results for input(s): BNP, PROBNP in the last 168 hours.   DDimer No results for input(s): DDIMER in the last 168 hours.   Radiology    No results found.  Cardiac Studies   Cardiac catheterization (07/07/2018)  Conclusion  Conclusions: 1. Overall stable appearance of multivessel coronary artery disease, including 70% distal LCx and 50% ostial rPDA stenoses. 2. Widely patent LIMA-OM. 3. Patent RIMA-diagonal with focal kinking in the mid portion of the graft with up to 70% stenosis in some views. 4. Chronically occluded SVG-rPDA. 5. Low normal to mildly reduced left ventricular contraction with mid inferior akinesis.  LVEF 50-55%. 6. Mildly elevated left ventricular filling pressure (LVEDP 15-20 mmHg).  Recommendations: 1. Medical therapy, including long-term dual antiplatelet therapy with aspirin and clopidogrel. 2. Antianginal therapy; continue metoprolol and start isosorbide mononitrate.  Importance of medication compliance and outpatient follow-up were stressed.  Nitroglycerin infusion stopped at end of the procedure. 3. Aggressive secondary prevention.     Patient  Profile     67 y.o. male with a hx of CAD s/p CABG, prastate CA, HTN, HLD with h/o statin intolerance and tobacco abuse (quit 2 month ago) who was seen for the evaluation of chest pain at the request of Dr. [place] Crane. Patient is a transfer from [place]    1: Unstable angina- history of CAD status post CABG in the past with a RIMA to a diagonal branch, LIMA to an obtuse marginal branch and an occluded vein to the RCA.  His last catheterization was April 2019 by Dr. Martinique.  Medical therapy was recommended.  He was admitted in transfer from [facility] where he was seen by Dr. Bettina Gavia.  His enzymes have all been negative.  He underwent cardiac catheterization yesterday by Dr. and revealing similar anatomy compared to his last cath by Dr. Martinique in April of this year.  He did have a "kink" in his [place] however I did not think this was percutaneously addressable.  The remainder of his anatomy is unchanged.  Given this, and his negative enzymes I feel comfortable sending him home today on antianginal medications.  2: Medication noncompliance- the patient stopped all medication 6 months ago because it "made him feel bad"  3: Tobacco abuse- quit 2 months ago  4: Essential hypertension- controlled on current medications.  5: Hyperlipidemia- intolerant to statin medications  6: Anemia- history of gastric ulcer with initial hemoglobin of 8, currently 13.  There clearly is a problem with this data.  He will need a repeat CBC to assess his hemoglobin.  We will start a PPI.Marland Kitchen Hemoglobin was 13 again suggesting that the value of 8 was erroneous.  Mr. Devol is stable for discharge home today on his current antianginal medications.  He will follow-up with Dr. Irish Lack as an outpatient.     For questions or updates, please contact [place] Please consult [email or web address] for contact info under        Signed, Quay Burow, MD  07/08/2018, 10:26 AM

## 2018-07-08 NOTE — Care Management Note (Addendum)
Case Management Note  Patient Details  Name: [name] MRN: [id number] Date of Birth: Dec 28, 1950  Subjective/Objective:   Pt admitted with CP                 Action/Plan:  PTA independent from home with wife.  Pt alert and oriented during assessment/discussion with CM. Pt recommended for SNF however refusing.  CM educated pt on the why's behind the recommendation however pt continues to refuse.  CM attempted to offer [facility] choice - pt refused [place] list and informed CM that he will not be available for [facility] services as he plans to return to work on Monday 07/14/18.  CM again attempted to educate pt on importance of safety at discharge - pt politely declined all [place] services and DME.  Pt also declined for CM to contact wife.  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  CM also discussed case with CM Director.     Expected Discharge Date:  07/08/18               Expected Discharge Plan:  [place]  In-House Referral:     Discharge planning Services  CM Consult  Post Acute Care Choice:    Choice offered to:     DME Arranged:    DME Agency:     HH Arranged:  Refused SNF(Refused HH) [place] Agency:     Status of Service:  Completed, signed off  If discussed at [employer], dates discussed:    Additional Comments: Update:  Attending to reach out to wife, will discontinue discharge if deemed unsafe Maryclare Labrador, RN 07/08/2018, 3:22 PM

## 2018-07-08 NOTE — Progress Notes (Signed)
Attempted to ambulate pt, pt very unsteady on both feet. Could not ambulate with walker. Pt stated he feel lightheaded. need pt/ot consult. Made MD aware. Will continue to monitor pt.

## 2018-07-08 NOTE — Progress Notes (Signed)
Notified Md.  Pt c/o 8/10 chest pain gave 2mg  iv  morphine  Earlier. VS stable  Pt did not want oxygen on.  Called to pt's room c/o nausea, gave Zofran. Pt has exp. Wheezes,  No medication on MAR.  Will continue to monitor Saunders Revel T

## 2018-07-08 NOTE — Discharge Summary (Addendum)
Discharge Summary  [name] CZY:[id number] DOB: 06/25/1951  PCP: [name], MD  Admit date: 07/05/2018 Discharge date: 07/08/2018  Time spent: 35 minutes  Recommendations for Outpatient Follow-up:  1. Follow-up with cardiology 2. Follow-up with PCP 3. Take  Your medications as prescribed 4. PT assessed and recommended SNF. Patient declines SNF and HHPT stating he just wants to go home and will work on his physical debility. 5. Fall precautions 6. DO NOT DRIVE OR OPERATE HEAVY MACHINERY UNTIL CLEARED BY YOUR MEDICAL DOCTOR.  Discharge Diagnoses:  Active Hospital Problems   Diagnosis Date Noted  . Unstable angina ([place]) 12/29/2016  . Tobacco abuse 12/29/2016  . Hypertension 12/25/2016  . Hyperlipidemia 12/25/2016  . GERD (gastroesophageal reflux disease) 12/25/2016  . COPD (chronic obstructive pulmonary disease) ([place]) 12/25/2016  . Mixed dyslipidemia 05/29/2016  . Hx of CABG 05/06/2016    Resolved Hospital Problems  No resolved problems to display.    Discharge Condition: Stable  Diet recommendation: Resume previous diet heart healthy diet  Vitals:   07/08/18 1046 07/08/18 1200  BP: 131/69 (!) 145/72  Pulse: 62 63  Resp: 14 16  Temp: 97.7 F (36.5 C) 98.1 F (36.7 C)  SpO2: 98% 98%    History of present illness:  [name] a 67 y.o.malewith medical history significant ofcoronary artery disease status post coronary artery bypass graft and multiple PCI with recent catheterization in April of this year. Patient [facility] to [facility] with chest pain which has been persistent. Chest pain is consistent with his prior MI. Discussion with cardiology was done and recommendation was to transfer patient to [facility] for elective cardiac catheterization as he is high risk for cardiac stress testing.  Left heart cath completed on 07/07/2018 with cardiology recommendations for medical management.  Patient was extensively counseled on the importance of medical  compliance.  07/08/2018: Patient seen and examined at bedside.  No acute events overnight.  He denies chest pain, palpitations or dyspnea.  On the day of discharge, the patient was hemodynamically stable.  He will need to follow-up with his cardiologist and PCP post hospitalization.    Hospital Course:  Principal Problem:   Unstable angina (HCC) Active Problems:   COPD (chronic obstructive pulmonary disease) (HCC)   GERD (gastroesophageal reflux disease)   Hx of CABG   Hyperlipidemia   Hypertension   Mixed dyslipidemia   Tobacco abuse  Unstable angina All 3 sets of troponin negative Left heart cath  on 07/07/2018.  Recommendations for medical management. Currently on aspirin 81 mg daily, Plavix 75 mg daily, Lipitor 40 mg daily, Zetia 10 mg daily, Imdur 30 mg daily, losartan 25 mg daily, Lopressor 25 mg twice daily as recommended by cardiology  Coronary artery disease status post PCI with stenting Patient had been noncompliant with his dual antiplatelets x 6 months Resume cardiac medications as stated above  Noncompliance with medical management Patient counseled on the importance of compliance  Chronic normocytic anemia/ iron deficiency anemia Iron studies done on 07/06/18 Ferrous sulfate 325 mg daily  Chronic diastolic CHF Last 2D echo done on 11/26/2017 revealed LVEF 50 to 55% with grade 1 diastolic dysfunction and normal wall motion Continue cardiac medications  Tobacco use disorder Tobacco cessation counseling done at bedside Nicotine patch \  COPD No acute issues Continue COPD medications  Hypertension Blood pressures well controlled Continue cardiac medications and antihypertensive medications  Hyperlipidemia Continue Lipitor and Zetia  Physical debility PT assessed and recommended SNF. Patient declined SNF and  Home Health PT services. He states he will work on his weakness Patient is alert and oriented x 4.   Code Status:Full code  Consults  called:Cardiology        Discharge Exam: BP (!) 145/72   Pulse 63   Temp 98.1 F (36.7 C) (Oral)   Resp 16   Wt 102 kg   SpO2 98%   BMI 31.36 kg/m  . General: 67 y.o. year-old male well developed well nourished in no acute distress.  Alert and oriented x3. . Cardiovascular: Regular rate and rhythm with no rubs or gallops.  No thyromegaly or JVD noted.   Marland Kitchen Respiratory: Clear to auscultation with no wheezes or rales. Good inspiratory effort. . Abdomen: Soft nontender nondistended with normal bowel sounds x4 quadrants. . Musculoskeletal: No lower extremity edema. 2/4 pulses in all 4 extremities. . Skin: No ulcerative lesions noted or rashes, . Psychiatry: Mood is appropriate for condition and setting  Discharge Instructions You were cared for by a hospitalist during your hospital stay. If you have any questions about your discharge medications or the care you received while you were in the hospital after you are discharged, you can call the unit and asked to speak with the hospitalist on call if the hospitalist that took care of you is not available. Once you are discharged, your primary care physician will handle any further medical issues. Please note that NO REFILLS for any discharge medications will be authorized once you are discharged, as it is imperative that you return to your primary care physician (or establish a relationship with a primary care physician if you do not have one) for your aftercare needs so that they can reassess your need for medications and monitor your lab values.   Allergies as of 07/08/2018      Reactions   Ciprofloxacin Hcl Nausea Only   Wife reported   Codeine Nausea And Vomiting, Other (See Comments)   Other reaction(s): GI Upset (intolerance) Pt not sure   Hydrocodone-acetaminophen Other (See Comments)   Other reaction(s): GI Upset (intolerance)   Hydrocortisone Nausea And Vomiting   Lisinopril    Wife reported   Meclizine    Wife  reported   Metronidazole Nausea Only   Wife reported   Pantoprazole Nausea Only   GI upset   Ranitidine    Wife reported   Simvastatin Nausea And Vomiting, Other (See Comments)   Other reaction(s): GI Upset (intolerance)   Influenza Vaccines Nausea And Vomiting      Medication List    STOP taking these medications   meclizine 25 MG tablet Commonly known as:  ANTIVERT     TAKE these medications   aspirin 81 MG tablet Take 1 tablet (81 mg total) by mouth daily.   atorvastatin 40 MG tablet Commonly known as:  LIPITOR Take 1 tablet (40 mg total) by mouth daily at 6 PM.   clopidogrel 75 MG tablet Commonly known as:  PLAVIX Take 1 tablet (75 mg total) by mouth daily. Start taking on:  07/09/2018   ezetimibe 10 MG tablet Commonly known as:  ZETIA TAKE 1 TABLET BY MOUTH ONCE (1) DAILY   ferrous sulfate 325 (65 FE) MG tablet Take 1 tablet (325 mg total) by mouth daily with breakfast. Start taking on:  07/09/2018   isosorbide mononitrate 30 MG 24 hr tablet Commonly known as:  IMDUR Take 1 tablet (30 mg total) by mouth daily. KEEP OV.   losartan 25 MG tablet Commonly known as:  COZAAR Take 1 tablet (25 mg total) by mouth daily. Start taking on:  07/09/2018   metoprolol tartrate 25 MG tablet Commonly known as:  LOPRESSOR Take 1 tablet (25 mg total) by mouth 2 (two) times daily. What changed:  additional instructions   nicotine 14 mg/24hr patch Commonly known as:  NICODERM CQ - dosed in mg/24 hours Place 1 patch (14 mg total) onto the skin daily.   nitroGLYCERIN 0.4 MG SL tablet Commonly known as:  NITROSTAT Place 1 tablet (0.4 mg total) under the tongue as needed for chest pain. X 3 doses      Allergies  Allergen Reactions  . Ciprofloxacin Hcl Nausea Only    Wife reported  . Codeine Nausea And Vomiting and Other (See Comments)    Other reaction(s): GI Upset (intolerance) Pt not sure  . Hydrocodone-Acetaminophen Other (See Comments)    Other reaction(s): GI  Upset (intolerance)  . Hydrocortisone Nausea And Vomiting  . Lisinopril     Wife reported  . Meclizine     Wife reported  . Metronidazole Nausea Only    Wife reported  . Pantoprazole Nausea Only    GI upset  . Ranitidine     Wife reported  . Simvastatin Nausea And Vomiting and Other (See Comments)    Other reaction(s): GI Upset (intolerance)  . Influenza Vaccines Nausea And Vomiting   Follow-up Information    Revankar, Reita Cliche, MD. Go on 07/17/2018.   Specialty:  Cardiology Why:  @10 :40am for cardiology hospital follow up. Please arrive 15 minutes early  Contact information: [place] [address] [phone]        [name], MD. Call in 1 day(s).   Specialty:  Family Medicine Why:  Please call for a post hospital follow-up appointment. Contact information: [place]        Jettie Booze, MD .   Specialties:  Cardiology, Radiology, Interventional Cardiology Contact information: [address] [place] [address] [phone]            The results of significant diagnostics from this hospitalization (including imaging, microbiology, ancillary and laboratory) are listed below for reference.    Significant Diagnostic Studies: No results found.  Microbiology: Recent Results (from the past 240 hour(s))  MRSA PCR Screening     Status: None   Collection Time: 07/05/18  6:11 PM  Result Value Ref Range Status   MRSA by PCR NEGATIVE NEGATIVE Final    Comment:        The GeneXpert MRSA Assay (FDA approved for NASAL specimens only), is one component of a comprehensive MRSA colonization surveillance program. It is not intended to diagnose MRSA infection nor to guide or monitor treatment for MRSA infections. Performed at [facility], [place] [address], [place], [place] [address]      Labs: Basic Metabolic Panel: Recent Labs  Lab 07/07/18 0204 07/08/18 0251    NA 136 138  K 4.2 4.4  CL 101 104  CO2 25 23  GLUCOSE 117* 95  BUN 15 14  CREATININE 1.33* 1.38*  CALCIUM 8.8* 8.6*   Liver Function Tests: No results for input(s): AST, ALT, ALKPHOS, BILITOT, PROT, ALBUMIN in the last 168 hours. No results for input(s): LIPASE, AMYLASE in the last 168 hours. No results for input(s): AMMONIA in the last 168 hours. CBC: Recent Labs  Lab 07/06/18 0233 07/07/18 0437 07/07/18 0948 07/08/18 0251  WBC 11.0* 7.6 7.5 6.2  HGB 8.8* 13.0 13.2 13.5  HCT 28.8* 39.6 40.9 42.8  MCV 88.1 87.4 88.1 88.8  PLT 307 182 184 185   Cardiac Enzymes: Recent Labs  Lab 07/05/18 1846 07/05/18 2109 07/06/18 0011  TROPONINI <0.03 <0.03 <0.03   BNP: BNP (last 3 results) Recent Labs    11/25/17 2243  BNP 58.0    ProBNP (last 3 results) No results for input(s): PROBNP in the last 8760 hours.  CBG: No results for input(s): GLUCAP in the last 168 hours.     Signed:  Kayleen Memos, MD [facility] 07/08/2018, 3:41 PM

## 2018-07-08 NOTE — Evaluation (Signed)
Physical Therapy Evaluation Patient Details Name: [name] MRN: [id number] DOB: 10/13/1950 Today's Date: 07/08/2018   History of Present Illness  Pt is a 67 y/o male transferred from [facility] secondary to unstable angina. Pt is s/p heart cath. PMH includes CAD s/p CABG, HTN, COPD, and tobacco abuse.   Clinical Impression  Pt admitted secondary to problem above with deficits below. Pt very unaware of current deficits and had excuses for unsteadiness throughout session. Pt with increased sway when standing X3, and pt closing and fluttering eyelids as if he would pass out. Checked BP and BP WFL. Attempted side stepping at EOB and pt with LOB requiring mod A. Further mobility deferred. Pt very resistive to education about current deficits and safety concerns. Unsure if pt will be agreeable to SNF. If balance improves, may be able to progress to home with HHPT. Will continue to follow acutely to maximize functional mobility independence and safety.     Follow Up Recommendations SNF;Supervision/Assistance - 24 hour(may progress to HHPT if balance improves)    Equipment Recommendations  None recommended by PT    Recommendations for Other Services       Precautions / Restrictions Precautions Precautions: Fall Restrictions Weight Bearing Restrictions: No      Mobility  Bed Mobility Overal bed mobility: Needs Assistance Bed Mobility: Supine to Sit;Sit to Supine     Supine to sit: Supervision Sit to supine: Supervision   General bed mobility comments: Supervision for safety.   Transfers Overall transfer level: Needs assistance Equipment used: Rolling walker (2 wheeled) Transfers: Sit to/from Stand Sit to Stand: Min guard;Min assist         General transfer comment: Verbal cues for safe hand placement. Pt with increased sway upon standing X3 and required min A for steadying assist. Also required cues to keep eyes open as pt closing eyes. Checked BP and BP WFL.    Ambulation/Gait Ambulation/Gait assistance: Mod assist   Assistive device: Rolling walker (2 wheeled)       General Gait Details: Attempted side stepping at EOB and pt with increased instability. Had 1 LOB requiring mod A to maintain balance. Did not feel safe to attempt further mobility with +1 assist. Pt insisting he could ambulate, however, required education about current deficits, decreased safety, etc that was limiting mobility.   Stairs            Wheelchair Mobility    Modified Rankin (Stroke Patients Only)       Balance Overall balance assessment: Needs assistance Sitting-balance support: No upper extremity supported;Feet supported Sitting balance-Leahy Scale: Fair     Standing balance support: Bilateral upper extremity supported;During functional activity Standing balance-Leahy Scale: Poor Standing balance comment: Very unsteady in standing with increased sway. Reliant on external assist and UE support                              Pertinent Vitals/Pain Pain Assessment: No/denies pain    Home Living Family/patient expects to be discharged to:: Private residence Living Arrangements: Spouse/significant other Available Help at Discharge: Family;Available 24 hours/day Type of Home: House Home Access: Stairs to enter Entrance Stairs-Rails: Psychiatric nurse of Steps: 3 Home Layout: One level Home Equipment: Walker - 2 wheels;Cane - single point;Crutches;Grab bars - tub/shower;Shower seat;Wheelchair - manual      Prior Function Level of Independence: Independent               Hand  Dominance        Extremity/Trunk Assessment   Upper Extremity Assessment Upper Extremity Assessment: Generalized weakness    Lower Extremity Assessment Lower Extremity Assessment: Generalized weakness    Cervical / Trunk Assessment Cervical / Trunk Assessment: Kyphotic  Communication   Communication: No difficulties  Cognition  Arousal/Alertness: Awake/alert Behavior During Therapy: WFL for tasks assessed/performed Overall Cognitive Status: Impaired/Different from baseline Area of Impairment: Safety/judgement                         Safety/Judgement: Decreased awareness of safety;Decreased awareness of deficits     General Comments: Pt very unaware of deficits. Pt unsteady at EOB and pt making excuses that it was the RW and that PT was pushing him. Educated multiple times about deficits throughout the session.       General Comments General comments (skin integrity, edema, etc.): Pt's wife present and receptive to education     Exercises     Assessment/Plan    PT Assessment Patient needs continued PT services  PT Problem List Decreased strength;Decreased balance;Decreased mobility;Decreased knowledge of use of DME;Decreased safety awareness;Decreased knowledge of precautions       PT Treatment Interventions DME instruction;Gait training;Functional mobility training;Therapeutic exercise;Therapeutic activities;Stair training;Balance training;Patient/family education    PT Goals (Current goals can be found in the Care Plan section)  Acute Rehab PT Goals Patient Stated Goal: to go home PT Goal Formulation: With patient Time For Goal Achievement: 07/22/18 Potential to Achieve Goals: Good    Frequency Min 3X/week   Barriers to discharge        Co-evaluation               AM-PAC PT "6 Clicks" Mobility  Outcome Measure Help needed turning from your back to your side while in a flat bed without using bedrails?: None Help needed moving from lying on your back to sitting on the side of a flat bed without using bedrails?: None Help needed moving to and from a bed to a chair (including a wheelchair)?: A Lot Help needed standing up from a chair using your arms (e.g., wheelchair or bedside chair)?: A Little Help needed to walk in hospital room?: A Lot Help needed climbing 3-5 steps with a  railing? : Total 6 Click Score: 16    End of Session Equipment Utilized During Treatment: Gait belt Activity Tolerance: Patient tolerated treatment well Patient left: in bed;with call bell/phone within reach;with family/visitor present Nurse Communication: Mobility status PT Visit Diagnosis: Unsteadiness on feet (R26.81);Muscle weakness (generalized) (M62.81)    Time: 8185-6314 PT Time Calculation (min) (ACUTE ONLY): 26 min   Charges:   PT Evaluation $PT Eval Moderate Complexity: 1 Mod PT Treatments $Therapeutic Activity: 8-22 mins        Leighton Ruff, PT, DPT  Acute Rehabilitation Services  Pager: [phone] Office: [phone]   Rudean Hitt 07/08/2018, 2:06 PM

## 2018-07-09 DIAGNOSIS — Z720 Tobacco use: Secondary | ICD-10-CM

## 2018-07-09 DIAGNOSIS — I10 Essential (primary) hypertension: Secondary | ICD-10-CM

## 2018-07-09 LAB — CBC
HCT: 41.6 % (ref 39.0–52.0)
Hemoglobin: 13.3 g/dL (ref 13.0–17.0)
MCH: 28.1 pg (ref 26.0–34.0)
MCHC: 32 g/dL (ref 30.0–36.0)
MCV: 87.8 fL (ref 80.0–100.0)
PLATELETS: 212 10*3/uL (ref 150–400)
RBC: 4.74 MIL/uL (ref 4.22–5.81)
RDW: 14 % (ref 11.5–15.5)
WBC: 6.8 10*3/uL (ref 4.0–10.5)
nRBC: 0 % (ref 0.0–0.2)

## 2018-07-09 MED ORDER — BUDESONIDE-FORMOTEROL FUMARATE 80-4.5 MCG/ACT IN AERO: 2.0000 | INHALATION_SPRAY | Freq: Every day | RESPIRATORY_TRACT | 12 refills | Status: DC

## 2018-07-09 NOTE — Progress Notes (Signed)
Physical Therapy Treatment Patient Details Name: [name] MRN: [id number] DOB: 05/18/1951 Today's Date: 07/09/2018    History of Present Illness Pt is a 67 y/o male transferred from [facility] secondary to unstable angina. Pt is s/p heart cath. PMH includes CAD s/p CABG, HTN, COPD, and tobacco abuse.     PT Comments    Patient progressing well towards his goals. Ambulating 200 feet with walker and min guard assist with mild balance deficits noted. Recommended walker for mobility at home to decrease fall risk. Noted left, upbeating, torsional nystagmus during smooth pursuits. Pt with positive Dix Hallpike maneuver towards left and subsequently treated with Canalith Repositioning maneuver towards right. Recommend HHPT at discharge to maximize functional independence.    Follow Up Recommendations  Supervision for mobility/OOB;Home health PT((vestibular))     Equipment Recommendations  None recommended by PT (has walker)   Recommendations for Other Services       Precautions / Restrictions Precautions Precautions: Fall Restrictions Weight Bearing Restrictions: No    Mobility  Bed Mobility Overal bed mobility: Modified Independent                Transfers Overall transfer level: Needs assistance Equipment used: Rolling walker (2 wheeled) Transfers: Sit to/from Stand Sit to Stand: Supervision            Ambulation/Gait Ambulation/Gait assistance: Min guard Gait Distance (Feet): 200 Feet Assistive device: Rolling walker (2 wheeled) Gait Pattern/deviations: Step-through pattern;Decreased stride length;Drifts right/left     General Gait Details: pt with two minor lateral LOB during dynamic gait, but able to self correct   Stairs             Wheelchair Mobility    Modified Rankin (Stroke Patients Only)       Balance Overall balance assessment: Needs assistance Sitting-balance support: No upper extremity supported;Feet supported Sitting  balance-Leahy Scale: Good     Standing balance support: During functional activity;No upper extremity supported Standing balance-Leahy Scale: Fair                              Cognition Arousal/Alertness: Awake/alert Behavior During Therapy: WFL for tasks assessed/performed Overall Cognitive Status: Impaired/Different from baseline Area of Impairment: Safety/judgement                         Safety/Judgement: Decreased awareness of safety;Decreased awareness of deficits     General Comments: Pt unaware of deficits and seemingly masking symptoms       Exercises Other Exercises Other Exercises: Youth worker assessment towards right and left and subsequent Canalith Repositioning Manuever towards right    General Comments        Pertinent Vitals/Pain Pain Assessment: No/denies pain    Home Living                      Prior Function            PT Goals (current goals can now be found in the care plan section) Acute Rehab PT Goals Patient Stated Goal: to go home PT Goal Formulation: With patient Time For Goal Achievement: 07/22/18 Potential to Achieve Goals: Good Progress towards PT goals: Progressing toward goals    Frequency    Min 3X/week      PT Plan Discharge plan needs to be updated    Co-evaluation  AM-PAC PT "6 Clicks" Mobility   Outcome Measure  Help needed turning from your back to your side while in a flat bed without using bedrails?: None Help needed moving from lying on your back to sitting on the side of a flat bed without using bedrails?: None Help needed moving to and from a bed to a chair (including a wheelchair)?: A Little Help needed standing up from a chair using your arms (e.g., wheelchair or bedside chair)?: None Help needed to walk in hospital room?: A Little Help needed climbing 3-5 steps with a railing? : A Little 6 Click Score: 21    End of Session Equipment Utilized During  Treatment: Gait belt Activity Tolerance: Patient tolerated treatment well Patient left: with call bell/phone within reach;in chair;with nursing/sitter in room Nurse Communication: Mobility status PT Visit Diagnosis: Unsteadiness on feet (R26.81);Muscle weakness (generalized) (M62.81)     Time: 1017-5102 PT Time Calculation (min) (ACUTE ONLY): 23 min  Charges:  $Therapeutic Activity: 8-22 mins $Canalith Rep Proc: 8-22 mins                    Ellamae Sia, PT, DPT Acute Rehabilitation Services Pager [phone] Office [phone]    Willy Eddy 07/09/2018, 1:10 PM

## 2018-07-09 NOTE — Progress Notes (Signed)
Discharge instructions reviewed with patient.  These included the following:  Prescriptions, activity restrictions (including driving restrictions as ordered by MD), dietary recommendations, medication uses and side effects, when to call the MD, diagnoses, f/u appointments, home health needs, etc.  Patient tells me that the only medications that he intends to take are his blood pressure pills.  The importance of his other medications (especially Plavix, ASA, and Lipitor) were emphasized.  When asked how he was to get home, he told me that he intended to drive himself home.  He was advised not to do so, but was adamant.  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  This was re-iterated to CM and MD will be made aware.

## 2018-07-09 NOTE — Progress Notes (Signed)
  PROGRESS NOTE  Patient seen and examined. He denies any chest pain, shortness of breath, nausea, vomiting, swelling. He ate breakfast without issue. He continues to decline SNF despite recommendation. Discussed with wife over the phone; she feels comfortable with him discharging home as they have care and equipment at home. Also discussed with them to avoid heavy machinery operation, medications sent to pharmacy, and follow up with cardiology. Stable for discharge today. See DC summary by Dr. Nevada Crane.   Dessa Phi, DO [facility] [email or web address] Password [email or web address] 07/09/2018, 8:52 AM

## 2018-07-09 NOTE — Care Management Note (Addendum)
Case Management Note  Patient Details  Name: [name] MRN: [id number] Date of Birth: [birth year]  Subjective/Objective:   Pt admitted with CP                 Action/Plan:  PTA independent from home with wife.  Pt alert and oriented during assessment/discussion with CM. Pt recommended for SNF however refusing.  CM educated pt on the why's behind the recommendation however pt continues to refuse.  CM attempted to offer [facility] choice - pt refused [place] list and informed CM that he will not be available for [facility] services as he plans to return to work on Monday 07/14/18.  CM again attempted to educate pt on importance of safety at discharge - pt politely declined all [place] services and DME.  Pt also declined for CM to contact wife.  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  CM also discussed case with CM Director.     Expected Discharge Date:  07/09/18               Expected Discharge Plan:  [place]  In-House Referral:     Discharge planning Services  CM Consult  Post Acute Care Choice:    Choice offered to:     DME Arranged:    DME Agency:     HH Arranged:  Refused SNF(Refused HH) [place] Agency:     Status of Service:  Completed, signed off  If discussed at [employer], dates discussed:    Additional Comments: 07/09/18 CM informed that pt is now in agreement with [facility] ,however pt reiterated with CM that he not interested in [facility].   Pt did confirm that he is interested in [facility]  CM reiterated that driving is not recommended until deemed safe by PCP - pt acknowledged the recommendation  Pt now deemed appropriate for discharge by attending.  Pt continues to refuse SNF and HH.   07/08/18 Update:  Attending to reach out to wife, will discontinue discharge if deemed unsafe Maryclare Labrador, RN 07/09/2018, 9:35 AM

## 2018-07-09 NOTE — Consult Note (Signed)
[facility] CM Primary Care Navigator  07/09/2018  [name] [birth year] [id number]   Met withpatient at the bedsideto identify possible discharge needs.  [facility] and reports having "persistent chest pains" that had led to this admission. (unstable angina, coronary artery disease status post cardiac catheterization, non-compliance with medical management, anemia, chronic diastolic HF, hyperlipidemia).  PatientendorsesDr. Kennith [name] with [place] as his primary care provider.   [facility] [place] without any problem.   Patient reports thatwife [name]) has been managing his medications at home with use of "pillbox" system filled weekly.  Patientverbalized that he hasbeen drivingprior to admission but his wife will be able to provide transportation tohis doctors' appointments after discharge.  Patient mentioned thathe lives with wife who will serve as his primary caregiver at home.   Patient had adamantly refused going to skilled nursing facility as recommended by therapy stating he had done it before but did never work.  Anticipated discharge planis home with possible home health services (RN made Inpatient CM aware of patient agreeing to it).  RN and PT made aware of patient's concern regarding swaying gait/ dizziness/ "vertigo" and had agreed with physical therapy recommendation for home health to follow-up ("vertigo") after discharge.   Patientvoiced understanding to call primary care provider's office whenhereturns home for a post discharge follow-up appointment within1- 2 weeksor sooner if needs arise.Patient letter (with PCP's contact number) was provided asareminder.  Patient is a 67 y.o.malewithhistory significant ofcoronary artery disease status post coronary artery bypass graft, prostate cancer, HTN, hyperlipidemia with  history of statin intolerance and tobacco abuse (quit 2 month ago), chronic obstructive pulmonary disease, GERD- gastroesophageal reflux disease, chronic diastolic congestive HF.   He reports having a weighing scale at home and admitsnot regularly monitoring weight nor recording. Patient mentionedhaving blood pressure cuff to check blood pressures but would needto regularly check blood pressure and record. Patient states trying to follow diet restrictions and need to be more active- doing exercises. He acknowledges the need for education, reminders with adherence/ compliance and reinforcements in managing chronic health conditions. Patienthas minimal awareness ofthesigns and symptoms of HTN,HF, COPDthatwill need medical assistance. He isnot aware of COPD action plan and not fully aware of COPD andHF zones/ tool and ways to manage chronic health conditions.  Explained to [facility] care management services andresources available forhim and hehad expressed interest for it.   Patient hadverbally agreed and opted for [facility]to [facility] for assessment/ follow-up of home needsinmanaging congestive HF, HTN, COPD; reinforce adherence to medical management/ treatment interventions (i.e: diet, medications, weighing daily/ blood pressure check daily and recording, driving restrictions) andfurther management of HF, HTN, COPD and other chronic health conditions at home.  Referral made to [facility] care coordinator to assess home needs, follow-up adherence/ compliance,gainfurther education, reminders and reinforcementintreating/managing chronic health conditionsafter discharge.  THNcare managementinformation provided for future needs thathe may have.   For additional questions please contact:  [name], BSN, RN-BC [facility] PRIMARY CARE Navigator Cell: [phone]

## 2018-07-09 NOTE — Progress Notes (Signed)
Introduced self to patient during bedside reporting process.  Patient stated he "wants to go home and continue with his life".  Denies chest pain.

## 2018-07-09 NOTE — Plan of Care (Signed)
  Problem: Education: Goal: Knowledge of General Education information will improve Description Including pain rating scale, medication(s)/side effects and non-pharmacologic comfort measures Outcome: Not Met (add Reason) Note:  Non-compliance / refusal to listen to educational material   Problem: Health Behavior/Discharge Planning: Goal: Ability to manage health-related needs will improve Outcome: Not Applicable Note:  Non-compliance with meeting self-health care needs   Problem: Clinical Measurements: Goal: Ability to maintain clinical measurements within normal limits will improve Outcome: Not Met (add Reason) Note:  Activity intolerance / SNF recommended, but refused by patient Goal: Will remain free from infection Outcome: Completed/Met Goal: Diagnostic test results will improve Outcome: Adequate for Discharge Goal: Respiratory complications will improve Outcome: Adequate for Discharge Goal: Cardiovascular complication will be avoided Outcome: Not Met (add Reason)   Problem: Clinical Measurements: Goal: Will remain free from infection Outcome: Completed/Met   Problem: Clinical Measurements: Goal: Diagnostic test results will improve Outcome: Adequate for Discharge   Problem: Activity: Goal: Risk for activity intolerance will decrease Outcome: Not Met (add Reason) Note:  Non-compliance   Problem: Nutrition: Goal: Adequate nutrition will be maintained Outcome: Adequate for Discharge   Problem: Elimination: Goal: Will not experience complications related to bowel motility Outcome: Adequate for Discharge Goal: Will not experience complications related to urinary retention Outcome: Adequate for Discharge   Problem: Pain Managment: Goal: General experience of comfort will improve Outcome: Adequate for Discharge   Problem: Skin Integrity: Goal: Risk for impaired skin integrity will decrease Outcome: Completed/Met   Problem: Education: Goal: Understanding of  CV disease, CV risk reduction, and recovery process will improve Outcome: Not Met (add Reason) Note:  Patient refuses to listen to educational materials provided by staff.  Threatened to leave AMA. Goal: Individualized Educational Video(s) Outcome: Not Met (add Reason) Note:  Patient refused to view educational videos   Problem: Education: Goal: Individualized Educational Video(s) Outcome: Not Met (add Reason) Note:  Patient refused to view educational videos   Problem: Activity: Goal: Ability to return to baseline activity level will improve Outcome: Not Met (add Reason) Note:  Patient continues to demonstrate dizziness.   Problem: Cardiovascular: Goal: Ability to achieve and maintain adequate cardiovascular perfusion will improve Outcome: Not Met (add Reason) Note:  Patient continues to show non-compliance towards taking medications necessary for his over-all health and well-being and wants to continue driving once discharged. Goal: Vascular access site(s) Level 0-1 will be maintained Outcome: Completed/Met   Problem: Health Behavior/Discharge Planning: Goal: Ability to safely manage health-related needs after discharge will improve Outcome: Not Met (add Reason) Note:  Patient states he will continue to drive once discharged and has made statements that he would not follow up with his health care providers or cardiologist.

## 2018-07-09 NOTE — Progress Notes (Signed)
Patient discharged to home accompanied by son.  Patient chose to ambulate to exit with son.  He stated his son would be driving him home in a private vehicle.

## 2018-07-10 ENCOUNTER — Other Ambulatory Visit: Payer: Self-pay

## 2018-07-11 NOTE — Patient Outreach (Signed)
Telephone assessment:Discharged on 07/09/2018.  MD office does transition of care.  Placed call to patient who answered and reports that he is doing well since hospital discharge on 07/09/2018.  Patient reports that he is feeling a little weak. Reports no dizziness. Reports an occasional chest pain. Reports that he has all his medications and is taking them as prescribed. Reports that he has MD appointments planned for 07/17/2018 with primary MD.   PLAN: offered home visit to assess needs and patient has agreed to home visit on 07/17/2018 at 8:30 am.  Will complete assessment at that time and determine goals.  Tomasa Rand, RN, BSN, CEN [facility] [employer] [phone]

## 2018-07-17 ENCOUNTER — Other Ambulatory Visit: Payer: Self-pay

## 2018-07-17 ENCOUNTER — Encounter: Payer: Self-pay | Admitting: Cardiology

## 2018-07-17 ENCOUNTER — Ambulatory Visit (INDEPENDENT_AMBULATORY_CARE_PROVIDER_SITE_OTHER): Payer: Medicare Other | Admitting: Cardiology

## 2018-07-17 VITALS — BP 126/78 | HR 70 | Ht 71.0 in | Wt 222.0 lb

## 2018-07-17 DIAGNOSIS — F419 Anxiety disorder, unspecified: Secondary | ICD-10-CM | POA: Diagnosis not present

## 2018-07-17 DIAGNOSIS — E782 Mixed hyperlipidemia: Secondary | ICD-10-CM

## 2018-07-17 DIAGNOSIS — I25119 Atherosclerotic heart disease of native coronary artery with unspecified angina pectoris: Secondary | ICD-10-CM | POA: Diagnosis not present

## 2018-07-17 DIAGNOSIS — I25708 Atherosclerosis of coronary artery bypass graft(s), unspecified, with other forms of angina pectoris: Secondary | ICD-10-CM

## 2018-07-17 DIAGNOSIS — I6523 Occlusion and stenosis of bilateral carotid arteries: Secondary | ICD-10-CM

## 2018-07-17 DIAGNOSIS — I25111 Atherosclerotic heart disease of native coronary artery with angina pectoris with documented spasm: Secondary | ICD-10-CM | POA: Diagnosis not present

## 2018-07-17 DIAGNOSIS — I472 Ventricular tachycardia: Secondary | ICD-10-CM

## 2018-07-17 DIAGNOSIS — Z0000 Encounter for general adult medical examination without abnormal findings: Secondary | ICD-10-CM | POA: Diagnosis not present

## 2018-07-17 DIAGNOSIS — Z1339 Encounter for screening examination for other mental health and behavioral disorders: Secondary | ICD-10-CM | POA: Diagnosis not present

## 2018-07-17 DIAGNOSIS — Z6832 Body mass index (BMI) 32.0-32.9, adult: Secondary | ICD-10-CM | POA: Diagnosis not present

## 2018-07-17 DIAGNOSIS — I10 Essential (primary) hypertension: Secondary | ICD-10-CM

## 2018-07-17 DIAGNOSIS — I4729 Other ventricular tachycardia: Secondary | ICD-10-CM

## 2018-07-17 NOTE — Progress Notes (Signed)
Cardiology Office Note:    Date:  07/17/2018   ID:  [name], DOB 1950/10/17, MRN [id number]  PCP:  [name], MD  Cardiologist:  Jenean Lindau, MD   Referring MD: [name], MD    ASSESSMENT:    1. Coronary artery disease involving native coronary artery of native heart with angina pectoris with documented spasm ([place])   2. Coronary artery disease of bypass graft of native heart with stable angina pectoris ([place])   3. Essential hypertension   4. Mixed hyperlipidemia   5. Bilateral carotid artery stenosis   6. Nonsustained ventricular tachycardia (HCC)    PLAN:    In order of problems listed above:  1. Secondary prevention stressed with the patient.  Importance of compliance with diet and medication stressed and he vocalized understanding.  His blood pressure is stable.  Diet was discussed for dyslipidemia.  Importance of regular walking stressed at least 30 minutes a day at least 5 days a week.  This was explained to the patient and he promises to do so. 2. On his coronary angiography report and lab work I noticed renal insufficiency and advised him to keep himself well-hydrated and be back in 1 week for blood work.  At that time he will come fasting and 2 liver lipid check also. 3. Patient will be seen in follow-up appointment in 6 months or earlier if the patient has any concerns    Medication Adjustments/Labs and Tests Ordered: Current medicines are reviewed at length with the patient today.  Concerns regarding medicines are outlined above.  Orders Placed This Encounter  Procedures  . Basic metabolic panel  . Hepatic function panel  . Lipid panel   No orders of the defined types were placed in this encounter.    History of Present Illness:    [name] is a 67 y.o. male who is being seen today for the evaluation of coronary artery disease and to get established at the request of [name], MD.  Patient is a pleasant 67 year old male.  He has past  medical history of coronary artery disease.  I have evaluated this gentleman in the past in my previous practice.  He has history of essential hypertension and nonsustained ventricular tachycardia.  He went to [facility] with significant symptoms and was transferred to [facility] where he underwent coronary angiography and this did not reveal any obstructive stenosis.  Patient was subsequently released and is here for follow-up.  He has nonobstructive carotid vascular disease and has no symptoms of TIA or stroke at this time.  At the time of my evaluation, the patient is alert awake oriented and in no distress.  His wife is supportive and accompanies him for this visit.  Past Medical History:  Diagnosis Date  . Arthritis   . Asthma   . Cancer [facility])    history of prostate cancer- cryoablation  . Cerebrovascular disease   . COPD (chronic obstructive pulmonary disease) ([place])   . Coronary artery disease    a. 2008 s/p CABG x 3 [facility]): LIMA->OM, RIMA->Diag, VG->RPDA;  b. 05/2016 NSTEMI/PCI: DES ->prox LAD and ost diag;  c. 12/2016 Cath/PCI: LM 95d, LAD 95ost (2.75x8 Promus Premier DES), patent prox stent, D2 30, LCX nl, OM2 95, RCA min irregs, LIMA->OM2 ok, RIMA->D2 85 "kink"-improved w/ IC NTG, VG->RPDA 100.  Marland Kitchen Dyspnea   . GERD (gastroesophageal reflux disease)   . Hyperlipidemia   . Hypertension   .  Mitral regurgitation    a. mod by cath 12/2016.  . Statin intolerance   . Stroke ([place])   . Syncope 02/2017  . Tobacco abuse     Past Surgical History:  Procedure Laterality Date  . ABDOMINAL AORTOGRAM N/A 12/26/2016   Procedure: Abdominal Aortogram;  Surgeon: Troy Sine, MD;  Location: [place] CV LAB;  Service: Cardiovascular;  Laterality: N/A;  . BACK SURGERY  2009  . CORONARY ANGIOGRAPHY N/A 12/28/2016   Procedure: Coronary Angiography;  Surgeon: Burnell Blanks, MD;  Location: [place] CV LAB;  Service: Cardiovascular;  Laterality: N/A;  . CORONARY  ARTERY BYPASS GRAFT  2008  . CORONARY ATHERECTOMY N/A 12/28/2016   Procedure: Coronary Atherectomy;  Surgeon: Burnell Blanks, MD;  Location: [place] CV LAB;  Service: Cardiovascular;  Laterality: N/A;  . CORONARY STENT INTERVENTION N/A 12/28/2016   Procedure: Coronary Stent Intervention;  Surgeon: Burnell Blanks, MD;  Location: [place] CV LAB;  Service: Cardiovascular;  Laterality: N/A;  . LEFT HEART CATH Left 05/07/2016   PR CATH PLACE/CORON ANGIO, IMG SUPER/TERP, W  [facility] CATH; SEVICE: CARDIOLOGY  . LEFT HEART CATH AND CORS/GRAFTS ANGIOGRAPHY N/A 12/26/2016   Procedure: Left Heart Cath and Cors/Grafts Angiography;  Surgeon: Troy Sine, MD;  Location: [place] CV LAB;  Service: Cardiovascular;  Laterality: N/A;  . LEFT HEART CATH AND CORS/GRAFTS ANGIOGRAPHY N/A 11/26/2017   Procedure: LEFT HEART CATH AND CORS/GRAFTS ANGIOGRAPHY;  Surgeon: Martinique, Peter M, MD;  Location: [place] CV LAB;  Service: Cardiovascular;  Laterality: N/A;  . LEFT HEART CATH AND CORS/GRAFTS ANGIOGRAPHY N/A 07/07/2018   Procedure: LEFT HEART CATH AND CORS/GRAFTS ANGIOGRAPHY;  Surgeon: Nelva Bush, MD;  Location: [place] CV LAB;  Service: Cardiovascular;  Laterality: N/A;  . LEFT HEART VENTRICULOGRAPHY Left 05/14/2016   PR CATH PLACE/ CORON ANGIO, IMG SUPER/ INTERP, W   . TOTAL HIP ARTHROPLASTY Right 2013    Current Medications: Current Meds  Medication Sig  . albuterol (PROVENTIL HFA;VENTOLIN HFA) 108 (90 Base) MCG/ACT inhaler Inhale 1 puff into the lungs every 6 (six) hours as needed.  Marland Kitchen aspirin 81 MG tablet Take 1 tablet (81 mg total) by mouth daily.  Marland Kitchen atorvastatin (LIPITOR) 40 MG tablet Take 1 tablet (40 mg total) by mouth daily at 6 PM.  . budesonide-formoterol (SYMBICORT) 80-4.5 MCG/ACT inhaler Inhale 2 puffs into the lungs daily.  . clopidogrel (PLAVIX) 75 MG tablet Take 1 tablet (75 mg total) by mouth daily.  Marland Kitchen ezetimibe (ZETIA) 10 MG tablet TAKE 1 TABLET BY MOUTH ONCE  (1) DAILY  . ferrous sulfate 325 (65 FE) MG tablet Take 1 tablet (325 mg total) by mouth daily with breakfast.  . isosorbide mononitrate (IMDUR) 30 MG 24 hr tablet Take 1 tablet (30 mg total) by mouth daily. KEEP OV.  Marland Kitchen losartan (COZAAR) 25 MG tablet Take 1 tablet (25 mg total) by mouth daily.  . metoprolol tartrate (LOPRESSOR) 25 MG tablet Take 1 tablet (25 mg total) by mouth 2 (two) times daily.  . nicotine (NICODERM CQ - DOSED IN MG/24 HOURS) 14 mg/24hr patch Place 1 patch (14 mg total) onto the skin daily.  . nitroGLYCERIN (NITROSTAT) 0.4 MG SL tablet Place 1 tablet (0.4 mg total) under the tongue as needed for chest pain. X 3 doses     Allergies:   Ciprofloxacin hcl; Codeine; Hydrocodone-acetaminophen; Hydrocortisone; Lisinopril; Meclizine; Metronidazole; Pantoprazole; Ranitidine; Simvastatin; and Influenza vaccines   Social History   Socioeconomic History  . Marital status:  Married    Spouse name: Katharine Look  . Number of children: 3  . Years of education: 9  . Highest education level: Not on file  Occupational History    Comment: truck driver, retired  Scientific laboratory technician  . Financial resource strain: Not on file  . Food insecurity:    Worry: Not on file    Inability: Not on file  . Transportation needs:    Medical: Not on file    Non-medical: Not on file  Tobacco Use  . Smoking status: Former Smoker    Packs/day: 0.25    Years: 56.00    Pack years: 14.00    Types: Cigarettes  . Smokeless tobacco: Never Used  . Tobacco comment: got paper at [place]   Substance and Sexual Activity  . Alcohol use: No  . Drug use: Not Currently  . Sexual activity: Yes    Birth control/protection: Other-see comments  Lifestyle  . Physical activity:    Days per week: Not on file    Minutes per session: Not on file  . Stress: Not on file  Relationships  . Social connections:    Talks on phone: Not on file    Gets together: Not on file    Attends religious service: Not on file    Active  member of club or organization: Not on file    Attends meetings of clubs or organizations: Not on file    Relationship status: Not on file  Other Topics Concern  . Not on file  Social History Narrative   Lives at home with family. Works as a Administrator. Independent otherwise   Caffeine- 2 cups daily     Family History: The patient's family history includes Hypertension in his brother, brother, and sister; Kidney failure in his mother.  ROS:   Please see the history of present illness.    All other systems reviewed and are negative.  EKGs/Labs/Other Studies Reviewed:    The following studies were reviewed today: I reviewed [facility] in [facility] records and also records of the cerebrovascular and neck CTA and coronary angiography report and discussed with the patient at extensive length.   Recent Labs: 11/25/2017: ALT 16; B Natriuretic Peptide 58.0 07/08/2018: BUN 14; Creatinine, Ser 1.38; Potassium 4.4; Sodium 138 07/09/2018: Hemoglobin 13.3; Platelets 212  Recent Lipid Panel    Component Value Date/Time   CHOL 151 01/29/2017 0426   TRIG 83 01/29/2017 0426   HDL 37 (L) 01/29/2017 0426   CHOLHDL 4.1 01/29/2017 0426   VLDL 17 01/29/2017 0426   LDLCALC 97 01/29/2017 0426    Physical Exam:    VS:  BP 126/78 (BP Location: Right Arm, Patient Position: Sitting, Cuff Size: Normal)   Pulse 70   Ht 5\' 11"  (1.803 m)   Wt 222 lb (100.7 kg)   SpO2 99%   BMI 30.96 kg/m     Wt Readings from Last 3 Encounters:  07/17/18 222 lb (100.7 kg)  07/17/18 218 lb (98.9 kg)  07/08/18 224 lb 13.9 oz (102 kg)     GEN: Patient is in no acute distress HEENT: Normal NECK: No JVD; No carotid bruits LYMPHATICS: No lymphadenopathy CARDIAC: S1 S2 regular, 2/6 systolic murmur at the apex. RESPIRATORY:  Clear to auscultation without rales, wheezing or rhonchi  ABDOMEN: Soft, non-tender, non-distended MUSCULOSKELETAL:  No edema; No deformity  SKIN: Warm and dry NEUROLOGIC:  Alert and  oriented x 3 PSYCHIATRIC:  Normal affect    Signed, Reita Cliche Rod Majerus,  MD  07/17/2018 11:45 AM    [place]

## 2018-07-17 NOTE — Patient Instructions (Signed)
Medication Instructions:  Your physician recommends that you continue on your current medications as directed. Please refer to the Current Medication list given to you today.  If you need a refill on your cardiac medications before your next appointment, please call your pharmacy.   Lab work: Your physician recommends that you have the following labs drawn: BMP, liver and lipid panel to be done in 1 week. You will need to be fasting, no appointment needed.   If you have labs (blood work) drawn today and your tests are completely normal, you will receive your results only by: Marland Kitchen MyChart Message (if you have MyChart) OR . A paper copy in the mail If you have any lab test that is abnormal or we need to change your treatment, we will call you to review the results.  Testing/Procedures: None  Follow-Up: At [facility], you and your health needs are our priority.  As part of our continuing mission to provide you with exceptional heart care, we have created designated Provider Care Teams.  These Care Teams include your primary Cardiologist (physician) and Advanced Practice Providers (APPs -  Physician Assistants and Nurse Practitioners) who all work together to provide you with the care you need, when you need it.  You will need a follow up appointment in 6 months.  Please call our office 2 months in advance to schedule this appointment.  You may see Larae Grooms, MD or another member of our [place] Provider Team in [place]: Jenne Campus, MD . Shirlee More, MD  Any Other Special Instructions Will Be Listed Below (If Applicable).

## 2018-07-17 NOTE — Patient Outreach (Signed)
[facility]) Care Management   07/17/2018  [name] June 16, 1951 [id number]  [name] is an 67 y.o. male  Subjective: chest pain every day for the last 3 weeks. Worse with movement. Recent admission to [facility] for chest pain and transferred to [facility]. Patient reports his cardiac work up was negative. Reports that he continues to have chest pain daily.  Reports that he does not take nitro. Also reports dizziness with changing of positions and moving of head.  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  Patient reports no problems when he is driving. Reviewed precaution as per Epic chart.  Currently not taking Symbicort.   Objective:  Awake and alert. Appears unsteady on his feet when getting up. Once he gets his balance he is able to ambulate without problems.   Today's Vitals   07/17/18 0848 07/17/18 0851  BP: 138/80   Pulse: 69   Resp: 18   SpO2: 95%   Weight: 218 lb (98.9 kg)   Height: 1.803 m (5\' 11" )   PainSc:  5    Review of Systems  Constitutional: Negative.   HENT: Positive for tinnitus.   Eyes: Negative.   Respiratory: Positive for cough, shortness of breath and wheezing.   Cardiovascular: Positive for chest pain and palpitations.  Gastrointestinal: Positive for nausea.  Genitourinary: Negative.   Musculoskeletal: Positive for falls.  Skin: Negative.   Neurological: Positive for dizziness.  Psychiatric/Behavioral: The patient has insomnia.     Physical Exam  Constitutional: He appears well-developed and well-nourished.  Cardiovascular: Normal rate, regular rhythm and normal heart sounds.  Respiratory: Effort normal.  wheezing  GI: Soft. Bowel sounds are normal.  Musculoskeletal: Normal range of motion.  Neurological: He is alert.  2019, [place], Thursday, December  Skin: Skin is warm and dry.  Flaky skin to lower legs.   Psychiatric: He has a normal mood and affect. His  behavior is normal. Judgment and thought content normal.    Encounter Medications:   Outpatient Encounter Medications as of 07/17/2018  Medication Sig Note  . albuterol (PROVENTIL HFA;VENTOLIN HFA) 108 (90 Base) MCG/ACT inhaler Inhale 1 puff into the lungs every 6 (six) hours as needed.   Marland Kitchen aspirin 81 MG tablet Take 1 tablet (81 mg total) by mouth daily.   Marland Kitchen atorvastatin (LIPITOR) 40 MG tablet Take 1 tablet (40 mg total) by mouth daily at 6 PM.   . clopidogrel (PLAVIX) 75 MG tablet Take 1 tablet (75 mg total) by mouth daily.   Marland Kitchen ezetimibe (ZETIA) 10 MG tablet TAKE 1 TABLET BY MOUTH ONCE (1) DAILY   . ferrous sulfate 325 (65 FE) MG tablet Take 1 tablet (325 mg total) by mouth daily with breakfast.   . isosorbide mononitrate (IMDUR) 30 MG 24 hr tablet Take 1 tablet (30 mg total) by mouth daily. KEEP OV.   Marland Kitchen losartan (COZAAR) 25 MG tablet Take 1 tablet (25 mg total) by mouth daily.   . metoprolol tartrate (LOPRESSOR) 25 MG tablet Take 1 tablet (25 mg total) by mouth 2 (two) times daily.   . budesonide-formoterol (SYMBICORT) 80-4.5 MCG/ACT inhaler Inhale 2 puffs into the lungs daily. (Patient not taking: Reported on 07/17/2018) 07/17/2018: Did not understand how to take.  . nicotine (NICODERM CQ - DOSED IN MG/24 HOURS) 14 mg/24hr patch Place 1 patch (14 mg total) onto the skin daily. (Patient not taking: Reported on 07/17/2018)   .  nitroGLYCERIN (NITROSTAT) 0.4 MG SL tablet Place 1 tablet (0.4 mg total) under the tongue as needed for chest pain. X 3 doses (Patient not taking: Reported on 07/17/2018)    No facility-administered encounter medications on file as of 07/17/2018.     Functional Status:   In your present state of health, do you have any difficulty performing the following activities: 07/17/2018 07/08/2018  Hearing? N N  Vision? Y N  Comment reading glasses -  Difficulty concentrating or making decisions? Y N  Walking or climbing stairs? N Y  Dressing or bathing? N N  Doing errands,  shopping? N Y  Conservation officer, nature and eating ? N -  Using the Toilet? N -  In the past six months, have you accidently leaked urine? N -  Do you have problems with loss of bowel control? N -  Managing your Medications? Y -  Comment wife adminsters medications -  Managing your Finances? N -  Housekeeping or managing your Housekeeping? N -  Some recent data might be hidden    Fall/Depression Screening:    Fall Risk  07/17/2018 05/20/2017 05/20/2017  Falls in the past year? 1 Yes No  Number falls in past yr: 1 2 or more -  Injury with Fall? 0 No -  Risk for fall due to : History of fall(s) Impaired balance/gait -   PHQ 2/9 Scores 07/17/2018  PHQ - 2 Score 0    Assessment:  (1) reviewed [facility] program. Provided new patient packet. Reviewed 24 hour nurse line and provided my contact card. Reviewed consent and consent obtained. (2) recent admission for chest pain and continues to have chest pain daily. (3) fall risk due to history of falls and dizziness with position changes.  (4) not taking symbicort.  Plan:  (1) consent scanned into medical record. (2) Encouraged patient to ask cardiologist questions today about chest pain and treatment. Encouraged low salt diet and explained it importance. Follow up also planned with primary MD today. (3) reviewed fall risk. Encouraged changing of positions slowly. Demonstrated how to change positions slowly. (4) reviewed reasons for taking Symbicort. Encouraged patient to take medications as prescribed.   This note and barrier letter sent to MD. Noted MD does transition of care. Plan to follow up with patient in 1 month via phone. Encouraged patient to call sooner if needed.    THN CM Care Plan Problem One     Most Recent Value  Care Plan Problem One  Recent admission for chest pain.  Role Documenting the Problem One  Care Management [place] for Problem One  Active  THN Long Term Goal   Patient will report no read  [facility] Long Term Goal  Start Date  07/17/18  Interventions for Problem One Long Term Goal  Home visit completed. Reviewed when to call MD. Reviewed with patient to keep follow up appointments.  THN CM Short Term Goal #1   Patient will report no falls in the next 30 days.   THN CM Short Term Goal #1 Start Date  07/17/18  Interventions for Short Term Goal #1  Reviewed with patient fall precautions, encouraged patient to increase activity.  THN CM Short Term Goal #2   Patient will report knowing action plan for chest pain after seeing MD today and following direction in the next 30 days.   THN CM Short Term Goal #2 Start Date  07/17/18  Interventions for Short Term Goal #2  Reviewed low salt diet, reviewed  COPD zones. Encouraged patient to get instruction from MD today about action plan.     Tomasa Rand, RN, BSN, CEN [facility] [employer] [phone]

## 2018-07-25 DIAGNOSIS — I25111 Atherosclerotic heart disease of native coronary artery with angina pectoris with documented spasm: Secondary | ICD-10-CM | POA: Diagnosis not present

## 2018-07-25 LAB — HEPATIC FUNCTION PANEL
ALBUMIN: 4.1 g/dL (ref 3.6–4.8)
ALT: 20 IU/L (ref 0–44)
AST: 18 IU/L (ref 0–40)
Alkaline Phosphatase: 119 IU/L — ABNORMAL HIGH (ref 39–117)
BILIRUBIN, DIRECT: 0.16 mg/dL (ref 0.00–0.40)
Bilirubin Total: 0.4 mg/dL (ref 0.0–1.2)
Total Protein: 6.9 g/dL (ref 6.0–8.5)

## 2018-07-25 LAB — BASIC METABOLIC PANEL
BUN/Creatinine Ratio: 9 — ABNORMAL LOW (ref 10–24)
BUN: 11 mg/dL (ref 8–27)
CO2: 21 mmol/L (ref 20–29)
Calcium: 9.1 mg/dL (ref 8.6–10.2)
Chloride: 104 mmol/L (ref 96–106)
Creatinine, Ser: 1.16 mg/dL (ref 0.76–1.27)
GFR calc Af Amer: 74 mL/min/{1.73_m2} (ref >59)
GFR calc non Af Amer: 64 mL/min/{1.73_m2} (ref >59)
Glucose: 88 mg/dL (ref 65–99)
Potassium: 4.6 mmol/L (ref 3.5–5.2)
Sodium: 140 mmol/L (ref 134–144)

## 2018-07-25 LAB — LIPID PANEL
CHOL/HDL RATIO: 2.7 ratio (ref 0.0–5.0)
Cholesterol, Total: 96 mg/dL — ABNORMAL LOW (ref 100–199)
HDL: 35 mg/dL — ABNORMAL LOW (ref >39)
LDL Calculated: 40 mg/dL (ref 0–99)
Triglycerides: 105 mg/dL (ref 0–149)
VLDL Cholesterol Cal: 21 mg/dL (ref 5–40)

## 2018-07-28 ENCOUNTER — Telehealth: Payer: Self-pay | Admitting: *Deleted

## 2018-07-28 DIAGNOSIS — E782 Mixed hyperlipidemia: Secondary | ICD-10-CM

## 2018-07-28 MED ORDER — ATORVASTATIN CALCIUM 40 MG PO TABS: 20.0000 mg | ORAL_TABLET | Freq: Every day | ORAL | 0 refills | Status: DC

## 2018-07-28 NOTE — Telephone Encounter (Signed)
Patient's wife, Katharine Look, informed of lab results per DPR and advised for patient to decrease atorvastatin from 40 mg to 20 mg daily. Patient will return to the [place] office in 3 months for follow up lab work, no appointment needed. Katharine Look verbalized understanding. No further questions.

## 2018-07-28 NOTE — Telephone Encounter (Signed)
-----   Message from Jenean Lindau, MD sent at 07/28/2018  8:13 AM EST ----- Reduce statin to half dose and rpt [id number] Jenean Lindau, MD 07/28/2018 8:12 AM

## 2018-08-11 ENCOUNTER — Encounter: Payer: Self-pay | Admitting: Internal Medicine

## 2018-08-13 ENCOUNTER — Other Ambulatory Visit: Payer: Self-pay

## 2018-08-13 DIAGNOSIS — Z96641 Presence of right artificial hip joint: Secondary | ICD-10-CM | POA: Diagnosis not present

## 2018-08-13 DIAGNOSIS — I700 Atherosclerosis of aorta: Secondary | ICD-10-CM | POA: Diagnosis not present

## 2018-08-13 DIAGNOSIS — R3129 Other microscopic hematuria: Secondary | ICD-10-CM | POA: Diagnosis not present

## 2018-08-13 DIAGNOSIS — Z981 Arthrodesis status: Secondary | ICD-10-CM | POA: Diagnosis not present

## 2018-08-13 DIAGNOSIS — M545 Low back pain: Secondary | ICD-10-CM | POA: Diagnosis not present

## 2018-08-13 NOTE — Patient Outreach (Signed)
Incoming call: Incoming call from patients wife who states that patient is down in his back.  Reports using walker today.  Wife inquiring about what to do:  Suggested patient or wife call primary MD and request an office visit. Also reviewed options of urgent care.  Encouraged wife to find a family member, friend, neighbor or church member to drive patient to the appointment.  Reviewed last resort of calling 911 and going to the hospital. Reviewed concern for exposure to flu in the emergency departments.  Wife voiced understanding. PLAN: follow up as planned.  Tomasa Rand, RN, BSN, CEN [facility] [employer] [phone]

## 2018-08-15 DIAGNOSIS — M5489 Other dorsalgia: Secondary | ICD-10-CM | POA: Diagnosis not present

## 2018-08-15 DIAGNOSIS — R52 Pain, unspecified: Secondary | ICD-10-CM | POA: Diagnosis not present

## 2018-08-15 DIAGNOSIS — M5442 Lumbago with sciatica, left side: Secondary | ICD-10-CM | POA: Diagnosis not present

## 2018-08-15 DIAGNOSIS — M545 Low back pain: Secondary | ICD-10-CM | POA: Diagnosis not present

## 2018-08-18 ENCOUNTER — Other Ambulatory Visit: Payer: Self-pay

## 2018-08-18 NOTE — Patient Outreach (Signed)
Telephone follow up:  Placed call to patient and spoke with wife. Wife reports that patient is doing a little better than last week. Reports he went tot he hospital with his back pain and he was diagnosed with muscle spasms. Wife reports that he is taking a muscle relaxer and is some better. Reports weight is unchanged at 218 pounds. Reports no recent falls and is currently using a walker. Denies any nutritional concerns today. Wife reports patient has all medications and is taking them as prescribed.   PLAN: follow up in 1 month. Tomasa Rand, RN, BSN, CEN [facility] [employer] [phone]

## 2018-09-12 DIAGNOSIS — M25559 Pain in unspecified hip: Secondary | ICD-10-CM | POA: Diagnosis not present

## 2018-09-18 ENCOUNTER — Other Ambulatory Visit: Payer: Self-pay

## 2018-09-18 DIAGNOSIS — M7062 Trochanteric bursitis, left hip: Secondary | ICD-10-CM | POA: Diagnosis not present

## 2018-09-18 DIAGNOSIS — M1612 Unilateral primary osteoarthritis, left hip: Secondary | ICD-10-CM | POA: Diagnosis not present

## 2018-09-18 NOTE — Patient Outreach (Signed)
Telephone outreach/ case closure:  Placed call to patient and spoke with wife. Wife states patient is having hip pain and has gone to the orthopedic office. Wife reports weight are unchanged. Denies any new falls. Reports patient is taking all his medications as prescribed. Denies any new concerns or problems today.  PLAN: reviewed case closure with wife who is also in agreement with case closure. Will send letter and encouraged wife to call back if needs in the future.  Tomasa Rand, RN, BSN, CEN [facility] [employer] [phone]

## 2018-12-30 IMAGING — MR MR HEAD W/O CM
10 series · 48 of 48 positions shown · non-contrast
Comparison: CT head 02/12/2017.  MR head 04/26/2008.

CLINICAL DATA: Syncopal episode. Swimmy headed. Vomiting with
diaphoresis.

EXAM:
MRI HEAD WITHOUT CONTRAST
TECHNIQUE: Multiplanar, multiecho pulse sequences of the brain and surrounding
structures were obtained without intravenous contrast.

[Series 2: T1 · sagittal · 5.0mm · 0.45mm/px · 3 of 25 slices shown (1 of 2)]
[im 1/25]
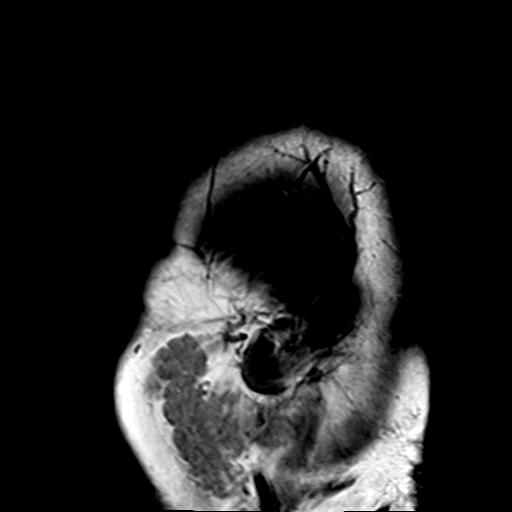
[im 13/25]
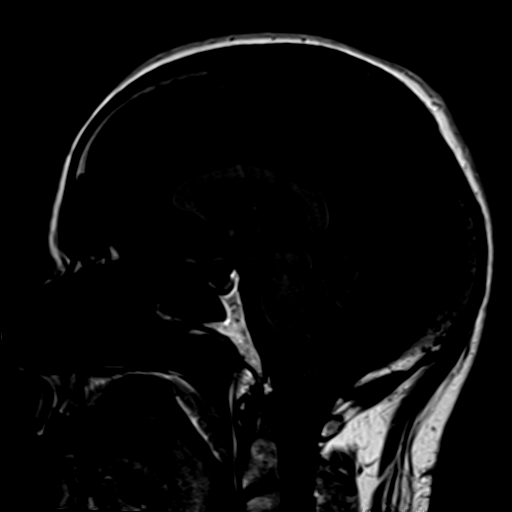
[im 25/25]
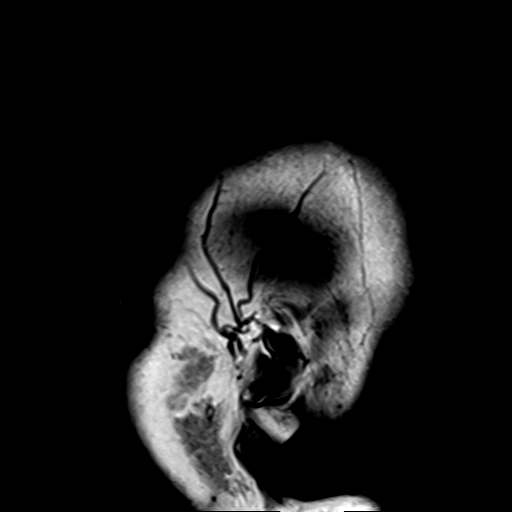

[Series 4: DWI · axial · 3.0mm · 1.80mm/px · z∈[-23,+138]mm · 5 of 55 slices shown (1 of 2)]
[im 1/55]
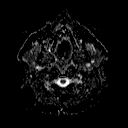
[im 14/55]
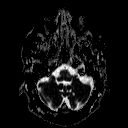
[im 28/55]
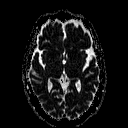
[im 41/55]
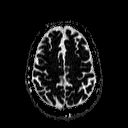
[im 55/55]
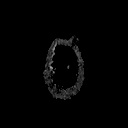

[Series 6: DWI · coronal · 3.0mm · 1.80mm/px · 4 of 45 slices shown (2 of 2)]
[im 1/45]
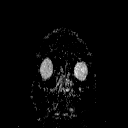
[im 15/45]
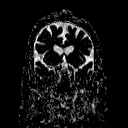
[im 30/45]
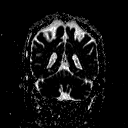
[im 45/45]
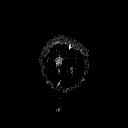

[Series 7: T2 · axial · 5.0mm · 0.60mm/px · z∈[-27,+141]mm · 2 of 27 slices shown (1 of 3)]
[im 1/27]
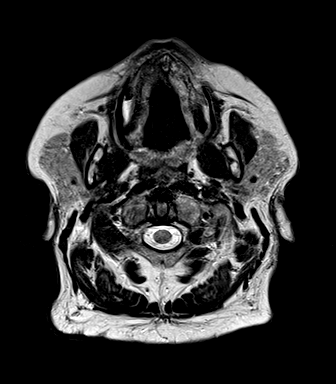
[im 27/27]
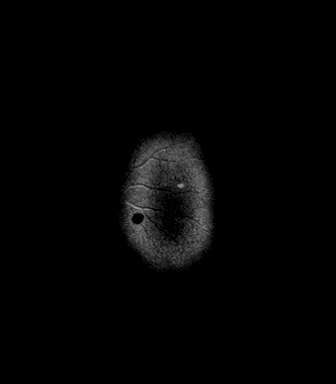

[Series 8: FLAIR · axial · 3.0mm · 0.45mm/px · z∈[-20,+135]mm · 5 of 53 slices shown]
[im 1/53]
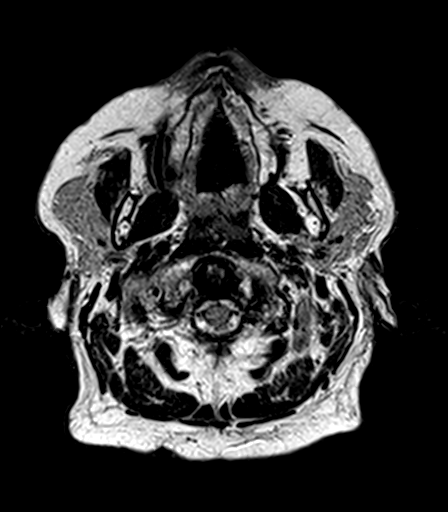
[im 14/53]
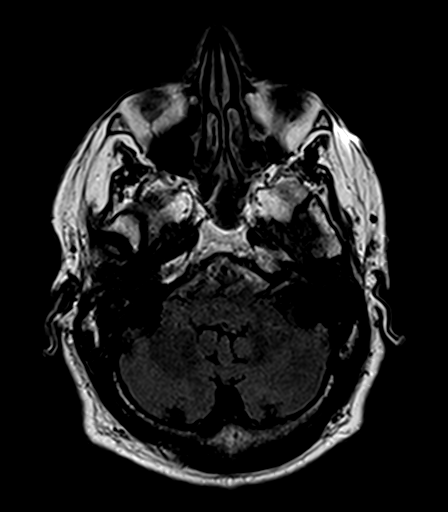
[im 27/53]
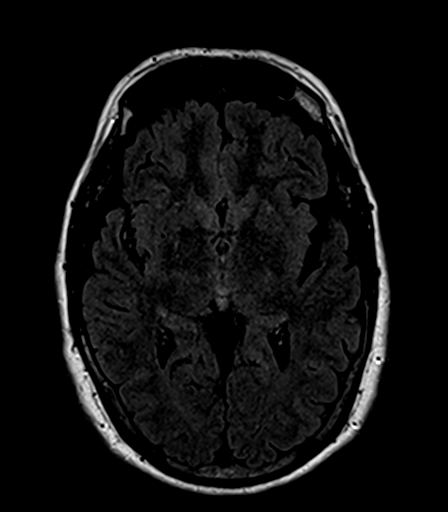
[im 40/53]
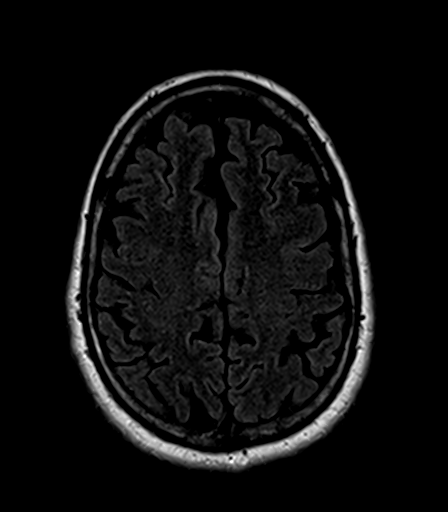
[im 53/53]
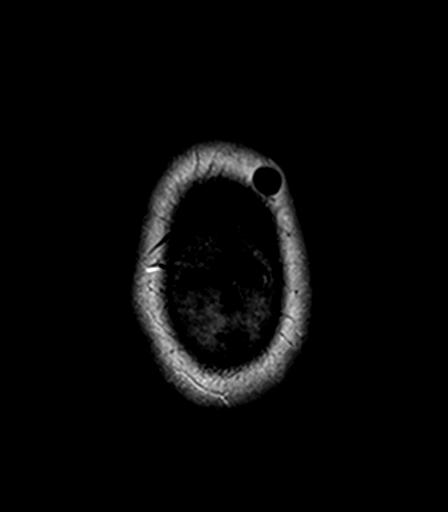

[Series 9: T1 · axial · 1.0mm · 1.00mm/px · z∈[-29,+146]mm · 16 of 176 slices shown (2 of 2)]
[im 1/176]
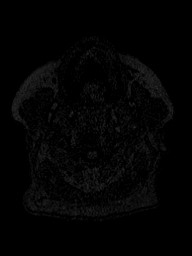
[im 12/176]
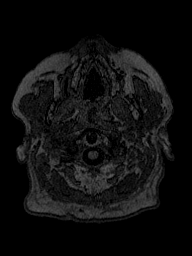
[im 24/176]
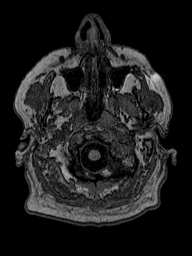
[im 36/176]
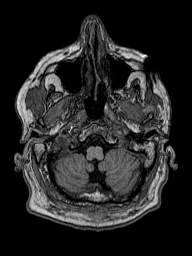
[im 47/176]
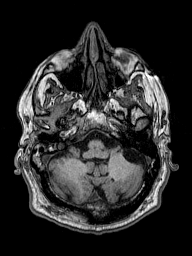
[im 59/176]
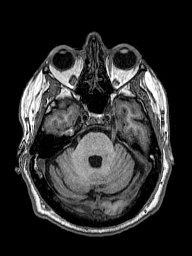
[im 71/176]
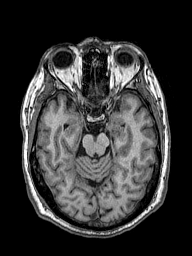
[im 82/176]
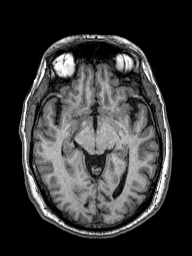
[im 94/176]
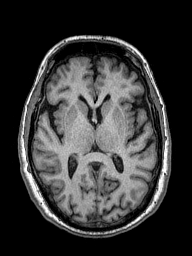
[im 106/176]
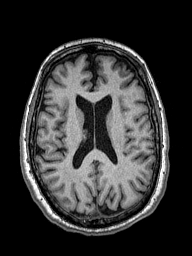
[im 117/176]
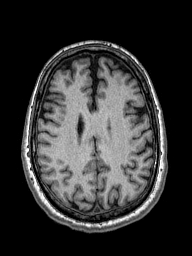
[im 129/176]
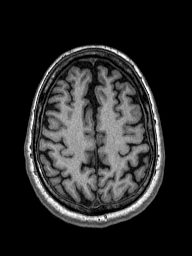
[im 141/176]
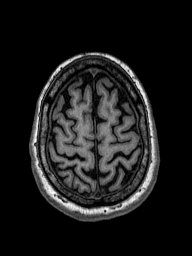
[im 152/176]
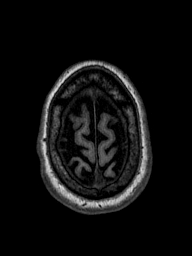
[im 164/176]
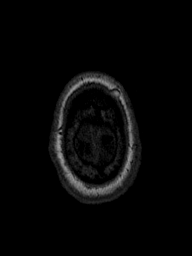
[im 176/176]
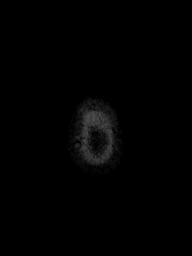

[Series 10: T2 · axial · 5.0mm · 0.45mm/px · z∈[-27,+141]mm · 2 of 27 slices shown (2 of 3)]
[im 1/27]
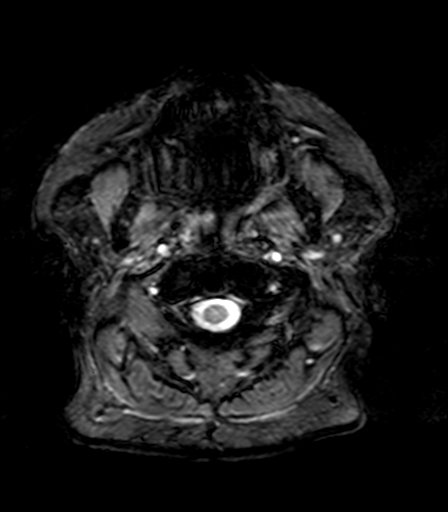
[im 27/27]
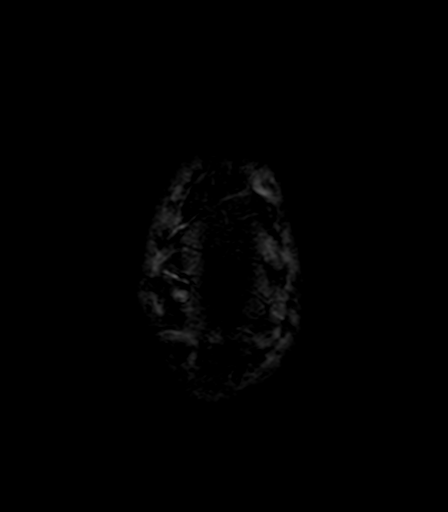

[Series 11: T2 · coronal · 5.0mm · 0.49mm/px · 2 of 27 slices shown (3 of 3)]
[im 1/27]
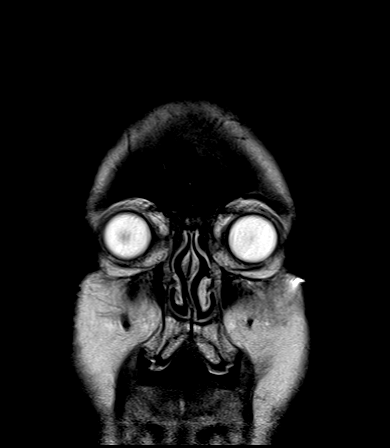
[im 27/27]
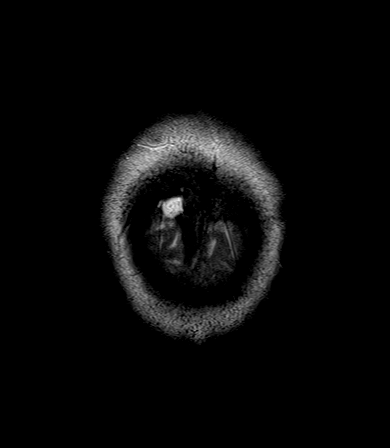

[Series 100: (id) ax · axial · 3.0mm · 1.80mm/px · z∈[-23,+138]mm · 5 of 55 slices shown]
[im 1/55]
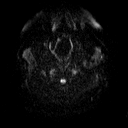
[im 14/55]
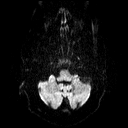
[im 28/55]
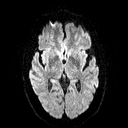
[im 41/55]
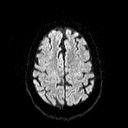
[im 55/55]
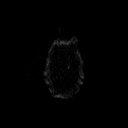

[Series 101: (id) cor · coronal · 3.0mm · 1.80mm/px · 4 of 42 slices shown]
[im 1/42]
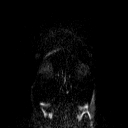
[im 14/42]
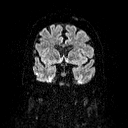
[im 28/42]
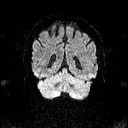
[im 42/42]
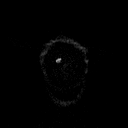

[48 of 48 positions shown; findings below may reference images not displayed]

FINDINGS: Brain: No evidence for acute infarction, hemorrhage, mass lesion,
hydrocephalus, or extra-axial fluid. Mild atrophy. Minor white
matter signal abnormality, likely small vessel disease.

Vascular: Flow voids are maintained throughout the carotid, basilar,
and vertebral arteries. There are no areas of chronic hemorrhage.

Skull and upper cervical spine: Unremarkable visualized calvarium,
skullbase, and cervical vertebrae. Pituitary, pineal, cerebellar
tonsils unremarkable. No upper cervical cord lesions.

Sinuses/Orbits: No orbital masses or proptosis. Globes appear
symmetric. Sinuses appear well aerated, without evidence for
air-fluid level.

Other: Compared with prior MR, slight progression of atrophy.
IMPRESSION: Atrophy and small vessel disease.  No acute intracranial findings.

## 2019-01-05 DIAGNOSIS — Z8673 Personal history of transient ischemic attack (TIA), and cerebral infarction without residual deficits: Secondary | ICD-10-CM | POA: Diagnosis not present

## 2019-01-05 DIAGNOSIS — R0602 Shortness of breath: Secondary | ICD-10-CM | POA: Diagnosis not present

## 2019-01-05 DIAGNOSIS — Z79899 Other long term (current) drug therapy: Secondary | ICD-10-CM | POA: Diagnosis not present

## 2019-01-05 DIAGNOSIS — Z7902 Long term (current) use of antithrombotics/antiplatelets: Secondary | ICD-10-CM | POA: Diagnosis not present

## 2019-01-05 DIAGNOSIS — Z951 Presence of aortocoronary bypass graft: Secondary | ICD-10-CM | POA: Diagnosis not present

## 2019-01-05 DIAGNOSIS — I25119 Atherosclerotic heart disease of native coronary artery with unspecified angina pectoris: Secondary | ICD-10-CM | POA: Diagnosis not present

## 2019-01-05 DIAGNOSIS — I2510 Atherosclerotic heart disease of native coronary artery without angina pectoris: Secondary | ICD-10-CM | POA: Diagnosis not present

## 2019-01-05 DIAGNOSIS — R079 Chest pain, unspecified: Secondary | ICD-10-CM | POA: Diagnosis not present

## 2019-01-05 DIAGNOSIS — I252 Old myocardial infarction: Secondary | ICD-10-CM | POA: Diagnosis not present

## 2019-01-05 DIAGNOSIS — I10 Essential (primary) hypertension: Secondary | ICD-10-CM | POA: Diagnosis not present

## 2019-01-05 DIAGNOSIS — Z7982 Long term (current) use of aspirin: Secondary | ICD-10-CM | POA: Diagnosis not present

## 2019-01-05 DIAGNOSIS — R0789 Other chest pain: Secondary | ICD-10-CM | POA: Diagnosis not present

## 2019-01-05 DIAGNOSIS — Z87891 Personal history of nicotine dependence: Secondary | ICD-10-CM | POA: Diagnosis not present

## 2019-05-16 DIAGNOSIS — R0789 Other chest pain: Secondary | ICD-10-CM | POA: Diagnosis not present

## 2019-05-16 DIAGNOSIS — E785 Hyperlipidemia, unspecified: Secondary | ICD-10-CM | POA: Diagnosis not present

## 2019-05-16 DIAGNOSIS — I10 Essential (primary) hypertension: Secondary | ICD-10-CM | POA: Diagnosis not present

## 2019-05-16 DIAGNOSIS — R079 Chest pain, unspecified: Secondary | ICD-10-CM | POA: Diagnosis not present

## 2019-05-16 DIAGNOSIS — Z9114 Patient's other noncompliance with medication regimen: Secondary | ICD-10-CM | POA: Diagnosis not present

## 2019-05-16 DIAGNOSIS — Z87891 Personal history of nicotine dependence: Secondary | ICD-10-CM | POA: Diagnosis not present

## 2019-05-16 DIAGNOSIS — Z951 Presence of aortocoronary bypass graft: Secondary | ICD-10-CM | POA: Diagnosis not present

## 2019-07-07 DIAGNOSIS — Z0000 Encounter for general adult medical examination without abnormal findings: Secondary | ICD-10-CM | POA: Diagnosis not present

## 2019-07-07 DIAGNOSIS — J180 Bronchopneumonia, unspecified organism: Secondary | ICD-10-CM | POA: Diagnosis not present

## 2019-07-07 DIAGNOSIS — Z2821 Immunization not carried out because of patient refusal: Secondary | ICD-10-CM | POA: Diagnosis not present

## 2019-07-19 DIAGNOSIS — S99911A Unspecified injury of right ankle, initial encounter: Secondary | ICD-10-CM | POA: Diagnosis not present

## 2019-07-19 DIAGNOSIS — M25571 Pain in right ankle and joints of right foot: Secondary | ICD-10-CM | POA: Diagnosis not present

## 2019-07-19 DIAGNOSIS — M79671 Pain in right foot: Secondary | ICD-10-CM | POA: Diagnosis not present

## 2019-07-19 DIAGNOSIS — S99921A Unspecified injury of right foot, initial encounter: Secondary | ICD-10-CM | POA: Diagnosis not present

## 2019-07-24 ENCOUNTER — Ambulatory Visit: Payer: Medicare Other | Admitting: Cardiology

## 2019-12-16 DIAGNOSIS — R58 Hemorrhage, not elsewhere classified: Secondary | ICD-10-CM | POA: Diagnosis not present

## 2019-12-16 DIAGNOSIS — K551 Chronic vascular disorders of intestine: Secondary | ICD-10-CM | POA: Diagnosis not present

## 2019-12-16 DIAGNOSIS — R109 Unspecified abdominal pain: Secondary | ICD-10-CM | POA: Diagnosis not present

## 2019-12-16 DIAGNOSIS — I701 Atherosclerosis of renal artery: Secondary | ICD-10-CM | POA: Diagnosis not present

## 2019-12-16 DIAGNOSIS — R590 Localized enlarged lymph nodes: Secondary | ICD-10-CM | POA: Diagnosis not present

## 2019-12-16 DIAGNOSIS — I774 Celiac artery compression syndrome: Secondary | ICD-10-CM | POA: Diagnosis not present

## 2019-12-21 DIAGNOSIS — R591 Generalized enlarged lymph nodes: Secondary | ICD-10-CM | POA: Diagnosis not present

## 2019-12-21 DIAGNOSIS — I771 Stricture of artery: Secondary | ICD-10-CM | POA: Diagnosis not present

## 2019-12-28 DIAGNOSIS — R109 Unspecified abdominal pain: Secondary | ICD-10-CM | POA: Diagnosis not present

## 2019-12-28 DIAGNOSIS — M545 Low back pain: Secondary | ICD-10-CM | POA: Diagnosis not present

## 2019-12-30 DIAGNOSIS — J449 Chronic obstructive pulmonary disease, unspecified: Secondary | ICD-10-CM | POA: Diagnosis not present

## 2019-12-30 DIAGNOSIS — R590 Localized enlarged lymph nodes: Secondary | ICD-10-CM | POA: Diagnosis not present

## 2019-12-30 DIAGNOSIS — C61 Malignant neoplasm of prostate: Secondary | ICD-10-CM | POA: Diagnosis not present

## 2019-12-30 DIAGNOSIS — I2510 Atherosclerotic heart disease of native coronary artery without angina pectoris: Secondary | ICD-10-CM | POA: Diagnosis not present

## 2019-12-30 DIAGNOSIS — Z8673 Personal history of transient ischemic attack (TIA), and cerebral infarction without residual deficits: Secondary | ICD-10-CM | POA: Diagnosis not present

## 2019-12-30 DIAGNOSIS — I252 Old myocardial infarction: Secondary | ICD-10-CM | POA: Diagnosis not present

## 2019-12-30 DIAGNOSIS — Z79899 Other long term (current) drug therapy: Secondary | ICD-10-CM | POA: Diagnosis not present

## 2019-12-30 DIAGNOSIS — E7800 Pure hypercholesterolemia, unspecified: Secondary | ICD-10-CM | POA: Diagnosis not present

## 2019-12-30 DIAGNOSIS — R109 Unspecified abdominal pain: Secondary | ICD-10-CM | POA: Diagnosis not present

## 2020-01-08 DIAGNOSIS — I252 Old myocardial infarction: Secondary | ICD-10-CM | POA: Diagnosis not present

## 2020-01-08 DIAGNOSIS — R109 Unspecified abdominal pain: Secondary | ICD-10-CM | POA: Diagnosis not present

## 2020-01-08 DIAGNOSIS — E7800 Pure hypercholesterolemia, unspecified: Secondary | ICD-10-CM | POA: Diagnosis not present

## 2020-01-08 DIAGNOSIS — J449 Chronic obstructive pulmonary disease, unspecified: Secondary | ICD-10-CM | POA: Diagnosis not present

## 2020-01-08 DIAGNOSIS — Z79899 Other long term (current) drug therapy: Secondary | ICD-10-CM | POA: Diagnosis not present

## 2020-01-08 DIAGNOSIS — I2510 Atherosclerotic heart disease of native coronary artery without angina pectoris: Secondary | ICD-10-CM | POA: Diagnosis not present

## 2020-01-08 DIAGNOSIS — Z8673 Personal history of transient ischemic attack (TIA), and cerebral infarction without residual deficits: Secondary | ICD-10-CM | POA: Diagnosis not present

## 2020-01-08 DIAGNOSIS — R590 Localized enlarged lymph nodes: Secondary | ICD-10-CM | POA: Diagnosis not present

## 2020-01-13 DIAGNOSIS — R519 Headache, unspecified: Secondary | ICD-10-CM | POA: Diagnosis not present

## 2020-01-13 DIAGNOSIS — R42 Dizziness and giddiness: Secondary | ICD-10-CM | POA: Diagnosis not present

## 2020-01-13 DIAGNOSIS — R935 Abnormal findings on diagnostic imaging of other abdominal regions, including retroperitoneum: Secondary | ICD-10-CM | POA: Diagnosis not present

## 2020-01-14 DIAGNOSIS — R109 Unspecified abdominal pain: Secondary | ICD-10-CM | POA: Diagnosis not present

## 2020-01-14 DIAGNOSIS — R591 Generalized enlarged lymph nodes: Secondary | ICD-10-CM | POA: Diagnosis not present

## 2020-01-14 DIAGNOSIS — R937 Abnormal findings on diagnostic imaging of other parts of musculoskeletal system: Secondary | ICD-10-CM | POA: Diagnosis not present

## 2020-01-21 DIAGNOSIS — C772 Secondary and unspecified malignant neoplasm of intra-abdominal lymph nodes: Secondary | ICD-10-CM | POA: Diagnosis not present

## 2020-01-21 DIAGNOSIS — R590 Localized enlarged lymph nodes: Secondary | ICD-10-CM | POA: Diagnosis not present

## 2020-01-28 DIAGNOSIS — C775 Secondary and unspecified malignant neoplasm of intrapelvic lymph nodes: Secondary | ICD-10-CM | POA: Diagnosis not present

## 2020-01-28 DIAGNOSIS — C61 Malignant neoplasm of prostate: Secondary | ICD-10-CM | POA: Diagnosis not present

## 2020-01-28 DIAGNOSIS — C7951 Secondary malignant neoplasm of bone: Secondary | ICD-10-CM | POA: Diagnosis not present

## 2020-02-01 DIAGNOSIS — C7951 Secondary malignant neoplasm of bone: Secondary | ICD-10-CM | POA: Diagnosis not present

## 2020-02-02 DIAGNOSIS — Z510 Encounter for antineoplastic radiation therapy: Secondary | ICD-10-CM | POA: Diagnosis not present

## 2020-02-02 DIAGNOSIS — C7951 Secondary malignant neoplasm of bone: Secondary | ICD-10-CM | POA: Diagnosis not present

## 2020-02-05 DIAGNOSIS — C7951 Secondary malignant neoplasm of bone: Secondary | ICD-10-CM | POA: Diagnosis not present

## 2020-02-05 DIAGNOSIS — Z510 Encounter for antineoplastic radiation therapy: Secondary | ICD-10-CM | POA: Diagnosis not present

## 2020-02-09 DIAGNOSIS — C7951 Secondary malignant neoplasm of bone: Secondary | ICD-10-CM | POA: Diagnosis not present

## 2020-02-10 DIAGNOSIS — C7951 Secondary malignant neoplasm of bone: Secondary | ICD-10-CM | POA: Diagnosis not present

## 2020-02-10 DIAGNOSIS — Z510 Encounter for antineoplastic radiation therapy: Secondary | ICD-10-CM | POA: Diagnosis not present

## 2020-02-11 ENCOUNTER — Other Ambulatory Visit: Payer: Self-pay | Admitting: *Deleted

## 2020-02-11 DIAGNOSIS — Z5111 Encounter for antineoplastic chemotherapy: Secondary | ICD-10-CM | POA: Diagnosis not present

## 2020-02-11 DIAGNOSIS — I771 Stricture of artery: Secondary | ICD-10-CM

## 2020-02-11 DIAGNOSIS — C7951 Secondary malignant neoplasm of bone: Secondary | ICD-10-CM | POA: Diagnosis not present

## 2020-02-11 DIAGNOSIS — Z510 Encounter for antineoplastic radiation therapy: Secondary | ICD-10-CM | POA: Diagnosis not present

## 2020-02-12 DIAGNOSIS — C7951 Secondary malignant neoplasm of bone: Secondary | ICD-10-CM | POA: Diagnosis not present

## 2020-02-12 DIAGNOSIS — Z510 Encounter for antineoplastic radiation therapy: Secondary | ICD-10-CM | POA: Diagnosis not present

## 2020-02-15 DIAGNOSIS — C7951 Secondary malignant neoplasm of bone: Secondary | ICD-10-CM | POA: Diagnosis not present

## 2020-02-15 DIAGNOSIS — Z510 Encounter for antineoplastic radiation therapy: Secondary | ICD-10-CM | POA: Diagnosis not present

## 2020-02-16 DIAGNOSIS — Z510 Encounter for antineoplastic radiation therapy: Secondary | ICD-10-CM | POA: Diagnosis not present

## 2020-02-16 DIAGNOSIS — C7951 Secondary malignant neoplasm of bone: Secondary | ICD-10-CM | POA: Diagnosis not present

## 2020-02-17 DIAGNOSIS — C7951 Secondary malignant neoplasm of bone: Secondary | ICD-10-CM | POA: Diagnosis not present

## 2020-02-17 DIAGNOSIS — C775 Secondary and unspecified malignant neoplasm of intrapelvic lymph nodes: Secondary | ICD-10-CM | POA: Diagnosis not present

## 2020-02-18 ENCOUNTER — Encounter (HOSPITAL_COMMUNITY): Payer: Medicare Other

## 2020-02-18 ENCOUNTER — Encounter: Payer: Medicare Other | Admitting: Vascular Surgery

## 2020-02-18 DIAGNOSIS — Z79899 Other long term (current) drug therapy: Secondary | ICD-10-CM | POA: Diagnosis not present

## 2020-02-18 DIAGNOSIS — I10 Essential (primary) hypertension: Secondary | ICD-10-CM | POA: Diagnosis not present

## 2020-02-18 DIAGNOSIS — C7951 Secondary malignant neoplasm of bone: Secondary | ICD-10-CM | POA: Diagnosis not present

## 2020-02-18 DIAGNOSIS — R531 Weakness: Secondary | ICD-10-CM | POA: Diagnosis not present

## 2020-02-18 DIAGNOSIS — I2510 Atherosclerotic heart disease of native coronary artery without angina pectoris: Secondary | ICD-10-CM | POA: Diagnosis not present

## 2020-02-18 DIAGNOSIS — J449 Chronic obstructive pulmonary disease, unspecified: Secondary | ICD-10-CM | POA: Diagnosis not present

## 2020-02-18 DIAGNOSIS — I252 Old myocardial infarction: Secondary | ICD-10-CM | POA: Diagnosis not present

## 2020-02-18 DIAGNOSIS — Z951 Presence of aortocoronary bypass graft: Secondary | ICD-10-CM | POA: Diagnosis not present

## 2020-02-18 DIAGNOSIS — Z881 Allergy status to other antibiotic agents status: Secondary | ICD-10-CM | POA: Diagnosis not present

## 2020-02-18 DIAGNOSIS — Z9089 Acquired absence of other organs: Secondary | ICD-10-CM | POA: Diagnosis not present

## 2020-02-18 DIAGNOSIS — Z888 Allergy status to other drugs, medicaments and biological substances status: Secondary | ICD-10-CM | POA: Diagnosis not present

## 2020-02-18 DIAGNOSIS — Z885 Allergy status to narcotic agent status: Secondary | ICD-10-CM | POA: Diagnosis not present

## 2020-02-18 DIAGNOSIS — Z8673 Personal history of transient ischemic attack (TIA), and cerebral infarction without residual deficits: Secondary | ICD-10-CM | POA: Diagnosis not present

## 2020-02-18 DIAGNOSIS — R270 Ataxia, unspecified: Secondary | ICD-10-CM | POA: Diagnosis not present

## 2020-02-18 DIAGNOSIS — E785 Hyperlipidemia, unspecified: Secondary | ICD-10-CM | POA: Diagnosis not present

## 2020-02-18 DIAGNOSIS — Z79891 Long term (current) use of opiate analgesic: Secondary | ICD-10-CM | POA: Diagnosis not present

## 2020-02-19 DIAGNOSIS — C801 Malignant (primary) neoplasm, unspecified: Secondary | ICD-10-CM | POA: Diagnosis not present

## 2020-02-19 DIAGNOSIS — C7951 Secondary malignant neoplasm of bone: Secondary | ICD-10-CM | POA: Diagnosis not present

## 2020-02-22 DIAGNOSIS — Z510 Encounter for antineoplastic radiation therapy: Secondary | ICD-10-CM | POA: Diagnosis not present

## 2020-02-22 DIAGNOSIS — C7951 Secondary malignant neoplasm of bone: Secondary | ICD-10-CM | POA: Diagnosis not present

## 2020-03-10 DIAGNOSIS — C775 Secondary and unspecified malignant neoplasm of intrapelvic lymph nodes: Secondary | ICD-10-CM | POA: Diagnosis not present

## 2020-03-10 DIAGNOSIS — C7951 Secondary malignant neoplasm of bone: Secondary | ICD-10-CM | POA: Diagnosis not present

## 2020-03-11 DIAGNOSIS — C7951 Secondary malignant neoplasm of bone: Secondary | ICD-10-CM | POA: Diagnosis not present

## 2020-03-20 DIAGNOSIS — R509 Fever, unspecified: Secondary | ICD-10-CM | POA: Diagnosis not present

## 2020-03-20 DIAGNOSIS — U071 COVID-19: Secondary | ICD-10-CM | POA: Diagnosis not present

## 2020-03-20 DIAGNOSIS — R531 Weakness: Secondary | ICD-10-CM | POA: Diagnosis not present

## 2020-03-20 DIAGNOSIS — N178 Other acute kidney failure: Secondary | ICD-10-CM | POA: Diagnosis not present

## 2020-03-20 DIAGNOSIS — Z8673 Personal history of transient ischemic attack (TIA), and cerebral infarction without residual deficits: Secondary | ICD-10-CM | POA: Diagnosis not present

## 2020-03-20 DIAGNOSIS — R42 Dizziness and giddiness: Secondary | ICD-10-CM | POA: Diagnosis not present

## 2020-03-20 DIAGNOSIS — Z888 Allergy status to other drugs, medicaments and biological substances status: Secondary | ICD-10-CM | POA: Diagnosis not present

## 2020-03-20 DIAGNOSIS — I252 Old myocardial infarction: Secondary | ICD-10-CM | POA: Diagnosis not present

## 2020-03-20 DIAGNOSIS — C7951 Secondary malignant neoplasm of bone: Secondary | ICD-10-CM | POA: Diagnosis not present

## 2020-03-20 DIAGNOSIS — R0902 Hypoxemia: Secondary | ICD-10-CM | POA: Diagnosis not present

## 2020-03-20 DIAGNOSIS — Z79891 Long term (current) use of opiate analgesic: Secondary | ICD-10-CM | POA: Diagnosis not present

## 2020-03-20 DIAGNOSIS — G92 Toxic encephalopathy: Secondary | ICD-10-CM | POA: Diagnosis not present

## 2020-03-20 DIAGNOSIS — Z7401 Bed confinement status: Secondary | ICD-10-CM | POA: Diagnosis not present

## 2020-03-20 DIAGNOSIS — Z743 Need for continuous supervision: Secondary | ICD-10-CM | POA: Diagnosis not present

## 2020-03-20 DIAGNOSIS — J9601 Acute respiratory failure with hypoxia: Secondary | ICD-10-CM | POA: Diagnosis not present

## 2020-03-20 DIAGNOSIS — D72819 Decreased white blood cell count, unspecified: Secondary | ICD-10-CM | POA: Diagnosis not present

## 2020-03-20 DIAGNOSIS — A4189 Other specified sepsis: Secondary | ICD-10-CM | POA: Diagnosis not present

## 2020-03-20 DIAGNOSIS — Z951 Presence of aortocoronary bypass graft: Secondary | ICD-10-CM | POA: Diagnosis not present

## 2020-03-20 DIAGNOSIS — R0602 Shortness of breath: Secondary | ICD-10-CM | POA: Diagnosis not present

## 2020-03-20 DIAGNOSIS — E872 Acidosis: Secondary | ICD-10-CM | POA: Diagnosis not present

## 2020-03-20 DIAGNOSIS — I2510 Atherosclerotic heart disease of native coronary artery without angina pectoris: Secondary | ICD-10-CM | POA: Diagnosis not present

## 2020-03-20 DIAGNOSIS — Z79899 Other long term (current) drug therapy: Secondary | ICD-10-CM | POA: Diagnosis not present

## 2020-03-20 DIAGNOSIS — M2550 Pain in unspecified joint: Secondary | ICD-10-CM | POA: Diagnosis not present

## 2020-03-20 DIAGNOSIS — R918 Other nonspecific abnormal finding of lung field: Secondary | ICD-10-CM | POA: Diagnosis not present

## 2020-03-20 DIAGNOSIS — J1282 Pneumonia due to coronavirus disease 2019: Secondary | ICD-10-CM | POA: Diagnosis not present

## 2020-03-20 DIAGNOSIS — R270 Ataxia, unspecified: Secondary | ICD-10-CM | POA: Diagnosis not present

## 2020-03-20 DIAGNOSIS — E7800 Pure hypercholesterolemia, unspecified: Secondary | ICD-10-CM | POA: Diagnosis not present

## 2020-03-20 DIAGNOSIS — R110 Nausea: Secondary | ICD-10-CM | POA: Diagnosis not present

## 2020-03-20 DIAGNOSIS — R069 Unspecified abnormalities of breathing: Secondary | ICD-10-CM | POA: Diagnosis not present

## 2020-03-20 DIAGNOSIS — G893 Neoplasm related pain (acute) (chronic): Secondary | ICD-10-CM | POA: Diagnosis not present

## 2020-03-20 DIAGNOSIS — Z881 Allergy status to other antibiotic agents status: Secondary | ICD-10-CM | POA: Diagnosis not present

## 2020-03-20 DIAGNOSIS — J440 Chronic obstructive pulmonary disease with acute lower respiratory infection: Secondary | ICD-10-CM | POA: Diagnosis not present

## 2020-03-20 DIAGNOSIS — Z955 Presence of coronary angioplasty implant and graft: Secondary | ICD-10-CM | POA: Diagnosis not present

## 2020-03-20 DIAGNOSIS — E860 Dehydration: Secondary | ICD-10-CM | POA: Diagnosis not present

## 2020-03-20 DIAGNOSIS — Z885 Allergy status to narcotic agent status: Secondary | ICD-10-CM | POA: Diagnosis not present

## 2020-03-20 DIAGNOSIS — Z87891 Personal history of nicotine dependence: Secondary | ICD-10-CM | POA: Diagnosis not present

## 2020-03-20 DIAGNOSIS — J9690 Respiratory failure, unspecified, unspecified whether with hypoxia or hypercapnia: Secondary | ICD-10-CM | POA: Diagnosis not present

## 2020-03-21 DIAGNOSIS — U071 COVID-19: Secondary | ICD-10-CM | POA: Diagnosis not present

## 2020-03-21 DIAGNOSIS — J9601 Acute respiratory failure with hypoxia: Secondary | ICD-10-CM | POA: Diagnosis not present

## 2020-03-22 DIAGNOSIS — U071 COVID-19: Secondary | ICD-10-CM | POA: Diagnosis not present

## 2020-03-22 DIAGNOSIS — J9601 Acute respiratory failure with hypoxia: Secondary | ICD-10-CM | POA: Diagnosis not present

## 2020-03-23 DIAGNOSIS — R918 Other nonspecific abnormal finding of lung field: Secondary | ICD-10-CM | POA: Diagnosis not present

## 2020-03-23 DIAGNOSIS — J9601 Acute respiratory failure with hypoxia: Secondary | ICD-10-CM | POA: Diagnosis not present

## 2020-03-23 DIAGNOSIS — U071 COVID-19: Secondary | ICD-10-CM | POA: Diagnosis not present

## 2020-03-23 DIAGNOSIS — R509 Fever, unspecified: Secondary | ICD-10-CM | POA: Diagnosis not present

## 2020-03-24 DIAGNOSIS — U071 COVID-19: Secondary | ICD-10-CM | POA: Diagnosis not present

## 2020-03-24 DIAGNOSIS — J9601 Acute respiratory failure with hypoxia: Secondary | ICD-10-CM | POA: Diagnosis not present

## 2020-03-25 DIAGNOSIS — J1282 Pneumonia due to coronavirus disease 2019: Secondary | ICD-10-CM

## 2020-03-25 DIAGNOSIS — J9601 Acute respiratory failure with hypoxia: Secondary | ICD-10-CM | POA: Diagnosis not present

## 2020-03-25 DIAGNOSIS — U071 COVID-19: Secondary | ICD-10-CM

## 2020-03-26 DIAGNOSIS — J9601 Acute respiratory failure with hypoxia: Secondary | ICD-10-CM | POA: Diagnosis not present

## 2020-03-26 DIAGNOSIS — U071 COVID-19: Secondary | ICD-10-CM | POA: Diagnosis not present

## 2020-03-27 DIAGNOSIS — J9601 Acute respiratory failure with hypoxia: Secondary | ICD-10-CM | POA: Diagnosis not present

## 2020-03-27 DIAGNOSIS — U071 COVID-19: Secondary | ICD-10-CM | POA: Diagnosis not present

## 2020-03-28 DIAGNOSIS — J9601 Acute respiratory failure with hypoxia: Secondary | ICD-10-CM | POA: Diagnosis not present

## 2020-03-28 DIAGNOSIS — J189 Pneumonia, unspecified organism: Secondary | ICD-10-CM | POA: Diagnosis not present

## 2020-03-28 DIAGNOSIS — U071 COVID-19: Secondary | ICD-10-CM | POA: Diagnosis not present

## 2020-03-29 DIAGNOSIS — J9601 Acute respiratory failure with hypoxia: Secondary | ICD-10-CM | POA: Diagnosis not present

## 2020-03-29 DIAGNOSIS — U071 COVID-19: Secondary | ICD-10-CM | POA: Diagnosis not present

## 2020-03-30 DIAGNOSIS — U071 COVID-19: Secondary | ICD-10-CM | POA: Diagnosis not present

## 2020-03-30 DIAGNOSIS — J9601 Acute respiratory failure with hypoxia: Secondary | ICD-10-CM | POA: Diagnosis not present

## 2020-03-31 DIAGNOSIS — J9601 Acute respiratory failure with hypoxia: Secondary | ICD-10-CM | POA: Diagnosis not present

## 2020-03-31 DIAGNOSIS — U071 COVID-19: Secondary | ICD-10-CM | POA: Diagnosis not present

## 2020-04-01 DIAGNOSIS — J9601 Acute respiratory failure with hypoxia: Secondary | ICD-10-CM | POA: Diagnosis not present

## 2020-04-01 DIAGNOSIS — U071 COVID-19: Secondary | ICD-10-CM | POA: Diagnosis not present

## 2020-04-07 ENCOUNTER — Encounter: Payer: Medicare Other | Admitting: Vascular Surgery

## 2020-04-07 ENCOUNTER — Inpatient Hospital Stay (HOSPITAL_COMMUNITY): Admission: RE | Admit: 2020-04-07 | Payer: Medicare Other | Source: Ambulatory Visit

## 2020-04-21 DIAGNOSIS — C7951 Secondary malignant neoplasm of bone: Secondary | ICD-10-CM | POA: Diagnosis not present

## 2020-04-21 DIAGNOSIS — C775 Secondary and unspecified malignant neoplasm of intrapelvic lymph nodes: Secondary | ICD-10-CM | POA: Diagnosis not present

## 2020-04-24 ENCOUNTER — Encounter: Payer: Self-pay | Admitting: Oncology

## 2020-04-24 DIAGNOSIS — C61 Malignant neoplasm of prostate: Secondary | ICD-10-CM | POA: Insufficient documentation

## 2020-04-27 DIAGNOSIS — Z0000 Encounter for general adult medical examination without abnormal findings: Secondary | ICD-10-CM | POA: Diagnosis not present

## 2020-04-27 DIAGNOSIS — I25810 Atherosclerosis of coronary artery bypass graft(s) without angina pectoris: Secondary | ICD-10-CM | POA: Diagnosis not present

## 2020-04-27 DIAGNOSIS — J9600 Acute respiratory failure, unspecified whether with hypoxia or hypercapnia: Secondary | ICD-10-CM | POA: Diagnosis not present

## 2020-05-14 ENCOUNTER — Encounter: Payer: Self-pay | Admitting: Pharmacist

## 2020-05-14 DIAGNOSIS — C7951 Secondary malignant neoplasm of bone: Secondary | ICD-10-CM | POA: Insufficient documentation

## 2020-05-14 DIAGNOSIS — C7952 Secondary malignant neoplasm of bone marrow: Secondary | ICD-10-CM | POA: Insufficient documentation

## 2020-05-19 ENCOUNTER — Other Ambulatory Visit: Payer: Self-pay | Admitting: Hematology and Oncology

## 2020-05-19 DIAGNOSIS — C7951 Secondary malignant neoplasm of bone: Secondary | ICD-10-CM | POA: Diagnosis not present

## 2020-05-19 LAB — BASIC METABOLIC PANEL
BUN: 14 (ref 4–21)
CO2: 24 — AB (ref 13–22)
Chloride: 107 (ref 99–108)
Creatinine: 1.2 (ref 0.6–1.3)
Glucose: 103
Potassium: 4 (ref 3.4–5.3)
Sodium: 140 (ref 137–147)

## 2020-05-19 LAB — COMPREHENSIVE METABOLIC PANEL
Albumin: 3.9 (ref 3.5–5.0)
Calcium: 8.4 — AB (ref 8.7–10.7)

## 2020-05-19 LAB — CBC: RBC: 4.11 (ref 3.87–5.11)

## 2020-05-19 LAB — HEPATIC FUNCTION PANEL
ALT: 31 (ref 10–40)
AST: 30 (ref 14–40)
Alkaline Phosphatase: 79 (ref 25–125)
Bilirubin, Total: 0.5

## 2020-05-19 LAB — CBC AND DIFFERENTIAL
HCT: 37 — AB (ref 41–53)
Hemoglobin: 12.4 — AB (ref 13.5–17.5)
Platelets: 250 (ref 150–399)
WBC: 5.6

## 2020-05-20 ENCOUNTER — Other Ambulatory Visit: Payer: Self-pay

## 2020-05-20 ENCOUNTER — Inpatient Hospital Stay: Payer: Medicare Other | Attending: Oncology

## 2020-05-20 VITALS — BP 172/90 | HR 80 | Temp 97.7°F | Resp 16 | Wt 207.0 lb

## 2020-05-20 DIAGNOSIS — Z5111 Encounter for antineoplastic chemotherapy: Secondary | ICD-10-CM | POA: Insufficient documentation

## 2020-05-20 DIAGNOSIS — C7951 Secondary malignant neoplasm of bone: Secondary | ICD-10-CM | POA: Diagnosis not present

## 2020-05-20 DIAGNOSIS — C61 Malignant neoplasm of prostate: Secondary | ICD-10-CM | POA: Diagnosis present

## 2020-05-20 DIAGNOSIS — C772 Secondary and unspecified malignant neoplasm of intra-abdominal lymph nodes: Secondary | ICD-10-CM | POA: Diagnosis not present

## 2020-05-20 MED ORDER — LEUPROLIDE ACETATE (3 MONTH) 22.5 MG IM KIT
22.5000 mg | PACK | Freq: Once | INTRAMUSCULAR | Status: AC
  Administered 2020-05-20: 22.5 mg via INTRAMUSCULAR

## 2020-05-20 NOTE — Progress Notes (Signed)
Patient stable at time of discharge. 

## 2020-06-06 NOTE — Progress Notes (Signed)
February 10, 2020 3:22:48 PM Dairl Ponder RN: Attempted call to pt, LVM on identified VM asking pt to return my call. [phone]. Pt will be taking xtandi (hormonal therapy) by mouth. Pt can take medication with or without food. Zytiga should be take@ the same time everyday. If you miss a dose, take it as soon as remembered. Common side effects are ; fatigue. Less common side effects are back, joint, muscular pain, diarrhea, hot flashes, swelling in hand, arms & feet. Low WBCs, headache, URI, dizziness, & muscle weakness. . February 11, 2020 11:16:42 AM Dairl Ponder RN: Spoke with pt, he is to receive shipment today, & will start xtandi tomorrow. February 19, 2020 2:27:53 PM Dairl Ponder RN: Spoke with pts wife, while pt receiving radiation today. She administers all his meds to him daily.Pt hasnt missed any doses. March 11, 2020 4:23:01 PM Dairl Ponder RN: Pt in clinic today to see Melissa. Pt states he is taking xtandi correctly and hasnt missed any doses. Assessment completed. March 18, 2020 2:00:51 PM Dairl Ponder RN: Attempted call to pt/pt's wife, Katharine Look. No answer-unidentified voice mail. [phone] March 18, 2020 3:54:32 PM Dairl Ponder RN: Pt's wife,Sandra, LVM on triage line that she is in [facility] with [place]. March 18, 2020 4:09:19 PM Dairl Ponder RN: I was able to find Card[facility]'s # [phone]. He states he hasnt started anymore Xtandi, because he has COVID. He stated, "I cant eat much, or I'll throw up and I dont want to waste any pills". He was due to start the 2nd cycle on 03/12/2020. I told pt I would call him next week for f/u. March 21, 2020 8:49:37 AM Dairl Ponder RN: Pt was admitted yesterday to [facility] with [place] . March 23, 2020 2:11:17 PM Dairl Ponder RN: Pt is still hospital @ this time. April 13, 2020 10:11:35 AM Dairl Ponder RN: I spoke with pt's wife. pt came homr from hospital on 03/26/2020. They both are still  recovering from the [place]. She reports they are tired. She states he has lost weight, isnt sleeping, & hasnt taken any Xtandi since 03/20/2020. April 27, 2020 3:38:37 PM Dairl Ponder RN: I spoke with pt and pt's wife, Katharine Look. They both are recuperating slowly from having COVID. Xtandi hasn't been shipped to his home yet. I told him that Tammy,CMA, faxed the new script in. He knows to call me when he received the xtandi. If he receives the xtandi over the weekend, he will go ahead and restart the med, & call me on Monday, 05/02/20. [phone] April 29, 2020 1:51:43 PM Dairl Ponder. RN: Pt called & is scheduled to receive the Xtandi on 05/02/2020. He will call me, when it arrives. May 02, 2020 3:05:04 PM Dairl Ponder RN: I spoke with pt, he received the xtandi today.He will start in the morning (he took in the morning before & wants to keep it the same). May 09, 2020 2:54:45 PM Dairl Ponder RN: I spoke with pt. He states, "I'm doing alright",. Denies side effects except the hot flashes.  May 20, 2020 3:42:26 PM Dairl Ponder RN: Pt in to see Dr Bobby Rumpf today. Dr Bobby Rumpf is giving pt a 10day break then he will restart Xtandi @ 3 tabs po day.

## 2020-06-07 NOTE — Progress Notes (Signed)
PT STABLE AT TIME OF DISCHARGE 

## 2020-06-08 ENCOUNTER — Telehealth: Payer: Self-pay

## 2020-06-08 NOTE — Telephone Encounter (Signed)
I spoke with pt to follow up on restarting cycle 3 of enzalutamide. Pt states he is having some nausea. I encouraged pt to take an antiemetic 1 hr before taking the enzalutamide, to see if resolves. Pt verbalized understanding.

## 2020-06-16 ENCOUNTER — Other Ambulatory Visit: Payer: Self-pay | Admitting: Pharmacist

## 2020-06-16 DIAGNOSIS — C61 Malignant neoplasm of prostate: Secondary | ICD-10-CM

## 2020-06-17 ENCOUNTER — Inpatient Hospital Stay: Payer: Medicare Other | Attending: Oncology

## 2020-06-17 DIAGNOSIS — C7951 Secondary malignant neoplasm of bone: Secondary | ICD-10-CM | POA: Insufficient documentation

## 2020-06-17 DIAGNOSIS — C772 Secondary and unspecified malignant neoplasm of intra-abdominal lymph nodes: Secondary | ICD-10-CM | POA: Insufficient documentation

## 2020-06-17 DIAGNOSIS — C61 Malignant neoplasm of prostate: Secondary | ICD-10-CM | POA: Insufficient documentation

## 2020-06-20 ENCOUNTER — Other Ambulatory Visit: Payer: Self-pay | Admitting: Hematology and Oncology

## 2020-06-20 MED ORDER — ZOLPIDEM TARTRATE 5 MG PO TABS: 5.0000 mg | ORAL_TABLET | Freq: Every evening | ORAL | 1 refills | Status: DC | PRN

## 2020-06-23 ENCOUNTER — Other Ambulatory Visit: Payer: Self-pay | Admitting: Oncology

## 2020-06-23 DIAGNOSIS — C61 Malignant neoplasm of prostate: Secondary | ICD-10-CM

## 2020-06-23 NOTE — Progress Notes (Signed)
[facility]  [address] [place],  [place]  [address] [phone]  Clinic Day:  06/24/2020  Referring physician: [name], MD   HISTORY OF PRESENT ILLNESS:  The patient is a 69 y.o. male with metastatic prostate cancer, which includes spread of disease to his bones and retroperitoneal lymph nodes.  He is currently on Lupron/enzalutamide to keep his disease under control for as long as possible.  He also previously underwent palliative radiation to his L3 vertebral body to treat the pain associated with his metastatic bone disease.  He comes in today to reassess his metastatic prostate cancer.  Since his enzalutamide was decreased to 120 mg, he has done slightly better.  His major issue today remains bone pain, primarily in his neck.  He also has persistent lower back pain despite receiving palliative radiation to this area.  Although he had initially stopped it, he got back to taking extended release morphine 30 mg BID.  He also takes MSIR 15 mg every 4 hours.  Despite taking these medications, his pain can be as high as a 9 out of 10.    PHYSICAL EXAM:  Blood pressure (!) 207/98, pulse 72, temperature 98 F (36.7 C), temperature source Oral, resp. rate 18, height 5\' 11"  (1.803 m), weight 209 lb 9.6 oz (95.1 kg), SpO2 99 %. Wt Readings from Last 3 Encounters:  06/24/20 209 lb 9.6 oz (95.1 kg)  06/07/20 207 lb (93.9 kg)  05/20/20 207 lb (93.9 kg)   Body mass index is 29.23 kg/m. Performance status (ECOG): 1 Physical Exam Constitutional:      Appearance: Normal appearance. He is not ill-appearing.  HENT:     Mouth/Throat:     Mouth: Mucous membranes are moist.     Pharynx: Oropharynx is clear. No oropharyngeal exudate or posterior oropharyngeal erythema.  Cardiovascular:     Rate and Rhythm: Normal rate and regular rhythm.     Heart sounds: No murmur heard.  No friction rub. No gallop.   Pulmonary:     Effort: Pulmonary effort is normal.  No respiratory distress.     Breath sounds: Normal breath sounds. No wheezing, rhonchi or rales.  Abdominal:     General: Bowel sounds are normal. There is no distension.     Palpations: Abdomen is soft. There is no mass.     Tenderness: There is no abdominal tenderness.  Musculoskeletal:        General: No swelling.     Right lower leg: No edema.     Left lower leg: No edema.  Lymphadenopathy:     Cervical: No cervical adenopathy.     Upper Body:     Right upper body: No supraclavicular or axillary adenopathy.     Left upper body: No supraclavicular or axillary adenopathy.     Lower Body: No right inguinal adenopathy. No left inguinal adenopathy.  Skin:    General: Skin is warm.     Coloration: Skin is not jaundiced.     Findings: No lesion or rash.  Neurological:     General: No focal deficit present.     Mental Status: He is alert and oriented to person, place, and time. Mental status is at baseline.     Cranial Nerves: Cranial nerves are intact.  Psychiatric:        Mood and Affect: Mood normal.        Behavior: Behavior normal.        Thought Content: Thought  content normal.     LABS:      ASSESSMENT & PLAN:   Assessment/Plan:  A 69 y.o. male with metastatic prostate cancer.  I am pleased as his PSA level has now fallen to an undetectable level while on his complete androgen blockade therapy of Lupron/enzalutamide.  For now, he knows to continue taking enzalutamide at 120 mg daily.  He will continue to receive Lupron injections every 3 months.   With respect to his back pain, his extended-release morphine will be increased to 60 mg BID.  He will continue to take MSIR 15 mg every 4-6 hours as needed for breakthrough pain.  He also knows to take a laxative every time he takes a pain pill to prevent significant constipation from being an issue.  He will continue to receive Zometa on a monthly basis to protect his bones against worsening metastatic disease.  I will see this  patient back in 1 month for repeat clinical assessment.  The patient understands all the plans discussed today and is in agreement with them.   Elinor Kleine Macarthur Critchley, MD

## 2020-06-24 ENCOUNTER — Encounter: Payer: Self-pay | Admitting: Oncology

## 2020-06-24 ENCOUNTER — Other Ambulatory Visit: Payer: Self-pay | Admitting: Oncology

## 2020-06-24 ENCOUNTER — Inpatient Hospital Stay (INDEPENDENT_AMBULATORY_CARE_PROVIDER_SITE_OTHER): Payer: Medicare Other | Admitting: Oncology

## 2020-06-24 ENCOUNTER — Inpatient Hospital Stay: Payer: Medicare Other

## 2020-06-24 ENCOUNTER — Other Ambulatory Visit: Payer: Self-pay

## 2020-06-24 VITALS — BP 207/98 | HR 72 | Temp 98.0°F | Resp 18 | Ht 71.0 in | Wt 209.6 lb

## 2020-06-24 DIAGNOSIS — Z0001 Encounter for general adult medical examination with abnormal findings: Secondary | ICD-10-CM | POA: Diagnosis not present

## 2020-06-24 DIAGNOSIS — C61 Malignant neoplasm of prostate: Secondary | ICD-10-CM | POA: Diagnosis not present

## 2020-06-24 LAB — HEPATIC FUNCTION PANEL
ALT: 14 (ref 10–40)
AST: 24 (ref 14–40)
Alkaline Phosphatase: 97 (ref 25–125)
Bilirubin, Total: 0.5

## 2020-06-24 LAB — BASIC METABOLIC PANEL
BUN: 11 (ref 4–21)
CO2: 24 — AB (ref 13–22)
Chloride: 107 (ref 99–108)
Creatinine: 1.2 (ref 0.6–1.3)
Glucose: 96
Potassium: 4.6 (ref 3.4–5.3)
Sodium: 140 (ref 137–147)

## 2020-06-24 LAB — COMPREHENSIVE METABOLIC PANEL
Albumin: 4 (ref 3.5–5.0)
Calcium: 9.3 (ref 8.7–10.7)

## 2020-06-24 MED ORDER — MORPHINE SULFATE ER 60 MG PO TBCR
60.0000 mg | EXTENDED_RELEASE_TABLET | Freq: Two times a day (BID) | ORAL | 0 refills | Status: DC
Start: 2020-06-24 — End: 2020-07-22

## 2020-06-26 LAB — PROSTATE-SPECIFIC AG, SERUM (LABCORP)

## 2020-06-27 ENCOUNTER — Other Ambulatory Visit: Payer: Self-pay

## 2020-06-27 ENCOUNTER — Other Ambulatory Visit: Payer: Self-pay | Admitting: Oncology

## 2020-06-27 ENCOUNTER — Inpatient Hospital Stay: Payer: Medicare Other

## 2020-06-27 VITALS — BP 178/76 | HR 78 | Temp 97.8°F | Resp 18 | Ht 71.0 in | Wt 213.0 lb

## 2020-06-27 DIAGNOSIS — C61 Malignant neoplasm of prostate: Secondary | ICD-10-CM

## 2020-06-27 DIAGNOSIS — C7951 Secondary malignant neoplasm of bone: Secondary | ICD-10-CM

## 2020-06-27 DIAGNOSIS — C772 Secondary and unspecified malignant neoplasm of intra-abdominal lymph nodes: Secondary | ICD-10-CM | POA: Diagnosis not present

## 2020-06-27 MED ORDER — ZOLEDRONIC ACID 4 MG/100ML IV SOLN
4.0000 mg | Freq: Once | INTRAVENOUS | Status: AC
  Administered 2020-06-27: 4 mg via INTRAVENOUS

## 2020-06-27 MED ORDER — ZOLEDRONIC ACID 4 MG/100ML IV SOLN
INTRAVENOUS | Status: AC
  Filled 2020-06-27: qty 100

## 2020-06-27 MED ORDER — SODIUM CHLORIDE 0.9 % IV SOLN
Freq: Once | INTRAVENOUS | Status: AC
  Filled 2020-06-27: qty 250

## 2020-06-27 NOTE — Patient Instructions (Signed)
Zoledronic Acid injection (Hypercalcemia, Oncology) What is this medicine? ZOLEDRONIC ACID (ZOE le dron ik AS id) lowers the amount of calcium loss from bone. It is used to treat too much calcium in your blood from cancer. It is also used to prevent complications of cancer that has spread to the bone. This medicine may be used for other purposes; ask your health care provider or pharmacist if you have questions. COMMON BRAND NAME(S): Zometa What should I tell my health care provider before I take this medicine? They need to know if you have any of these conditions:  aspirin-sensitive asthma  cancer, especially if you are receiving medicines used to treat cancer  dental disease or wear dentures  infection  kidney disease  receiving corticosteroids like dexamethasone or prednisone  an unusual or allergic reaction to zoledronic acid, other medicines, foods, dyes, or preservatives  pregnant or trying to get pregnant  breast-feeding How should I use this medicine? This medicine is for infusion into a vein. It is given by a health care professional in a hospital or clinic setting. Talk to your pediatrician regarding the use of this medicine in children. Special care may be needed. Overdosage: If you think you have taken too much of this medicine contact a poison control center or emergency room at once. NOTE: This medicine is only for you. Do not share this medicine with others. What if I miss a dose? It is important not to miss your dose. Call your doctor or health care professional if you are unable to keep an appointment. What may interact with this medicine?  certain antibiotics given by injection  NSAIDs, medicines for pain and inflammation, like ibuprofen or naproxen  some diuretics like bumetanide, furosemide  teriparatide  thalidomide This list may not describe all possible interactions. Give your health care provider a list of all the medicines, herbs, non-prescription  drugs, or dietary supplements you use. Also tell them if you smoke, drink alcohol, or use illegal drugs. Some items may interact with your medicine. What should I watch for while using this medicine? Visit your doctor or health care professional for regular checkups. It may be some time before you see the benefit from this medicine. Do not stop taking your medicine unless your doctor tells you to. Your doctor may order blood tests or other tests to see how you are doing. Women should inform their doctor if they wish to become pregnant or think they might be pregnant. There is a potential for serious side effects to an unborn child. Talk to your health care professional or pharmacist for more information. You should make sure that you get enough calcium and vitamin D while you are taking this medicine. Discuss the foods you eat and the vitamins you take with your health care professional. Some people who take this medicine have severe bone, joint, and/or muscle pain. This medicine may also increase your risk for jaw problems or a broken thigh bone. Tell your doctor right away if you have severe pain in your jaw, bones, joints, or muscles. Tell your doctor if you have any pain that does not go away or that gets worse. Tell your dentist and dental surgeon that you are taking this medicine. You should not have major dental surgery while on this medicine. See your dentist to have a dental exam and fix any dental problems before starting this medicine. Take good care of your teeth while on this medicine. Make sure you see your dentist for regular follow-up   appointments. What side effects may I notice from receiving this medicine? Side effects that you should report to your doctor or health care professional as soon as possible:  allergic reactions like skin rash, itching or hives, swelling of the face, lips, or tongue  anxiety, confusion, or depression  breathing problems  changes in vision  eye  pain  feeling faint or lightheaded, falls  jaw pain, especially after dental work  mouth sores  muscle cramps, stiffness, or weakness  redness, blistering, peeling or loosening of the skin, including inside the mouth  trouble passing urine or change in the amount of urine Side effects that usually do not require medical attention (report to your doctor or health care professional if they continue or are bothersome):  bone, joint, or muscle pain  constipation  diarrhea  fever  hair loss  irritation at site where injected  loss of appetite  nausea, vomiting  stomach upset  trouble sleeping  trouble swallowing  weak or tired This list may not describe all possible side effects. Call your doctor for medical advice about side effects. You may report side effects to FDA at 1-800-FDA-1088. Where should I keep my medicine? This drug is given in a hospital or clinic and will not be stored at home. NOTE: This sheet is a summary. It may not cover all possible information. If you have questions about this medicine, talk to your doctor, pharmacist, or health care provider.  2020 Elsevier/Gold Standard (2013-12-19 14:19:39)  

## 2020-06-27 NOTE — Progress Notes (Signed)
Pt stable at time of discharge. 

## 2020-06-28 NOTE — Progress Notes (Signed)
Patient was approved for free Xtandi thru [employer] until 08/05/2021.

## 2020-07-20 ENCOUNTER — Ambulatory Visit: Payer: Medicare Other | Admitting: Oncology

## 2020-07-20 ENCOUNTER — Inpatient Hospital Stay: Payer: Medicare Other | Attending: Oncology

## 2020-07-20 ENCOUNTER — Other Ambulatory Visit: Payer: Self-pay

## 2020-07-20 ENCOUNTER — Other Ambulatory Visit: Payer: Self-pay | Admitting: Hematology and Oncology

## 2020-07-20 DIAGNOSIS — C7951 Secondary malignant neoplasm of bone: Secondary | ICD-10-CM | POA: Insufficient documentation

## 2020-07-20 DIAGNOSIS — C61 Malignant neoplasm of prostate: Secondary | ICD-10-CM | POA: Diagnosis present

## 2020-07-20 DIAGNOSIS — Z0001 Encounter for general adult medical examination with abnormal findings: Secondary | ICD-10-CM | POA: Diagnosis not present

## 2020-07-20 DIAGNOSIS — D649 Anemia, unspecified: Secondary | ICD-10-CM | POA: Diagnosis not present

## 2020-07-20 LAB — CBC AND DIFFERENTIAL
HCT: 41 (ref 41–53)
Hemoglobin: 13.8 (ref 13.5–17.5)
Neutrophils Absolute: 3.47
Platelets: 229 (ref 150–399)
WBC: 5.1

## 2020-07-20 LAB — COMPREHENSIVE METABOLIC PANEL
Albumin: 4.2 (ref 3.5–5.0)
Calcium: 8.8 (ref 8.7–10.7)

## 2020-07-20 LAB — BASIC METABOLIC PANEL
BUN: 10 (ref 4–21)
CO2: 22 (ref 13–22)
Chloride: 110 — AB (ref 99–108)
Creatinine: 1.1 (ref 0.6–1.3)
Glucose: 123
Potassium: 4 (ref 3.4–5.3)
Sodium: 141 (ref 137–147)

## 2020-07-20 LAB — HEPATIC FUNCTION PANEL
ALT: 13 (ref 10–40)
AST: 18 (ref 14–40)
Alkaline Phosphatase: 81 (ref 25–125)
Bilirubin, Total: 0.5

## 2020-07-20 LAB — CBC: RBC: 4.62 (ref 3.87–5.11)

## 2020-07-21 LAB — PROSTATE-SPECIFIC AG, SERUM (LABCORP): Prostate Specific Ag, Serum: <0.1 ng/mL (ref 0.0–4.0)

## 2020-07-21 NOTE — Progress Notes (Signed)
PT STABLE AT TIME OF DISCHARGE 

## 2020-07-21 NOTE — Progress Notes (Signed)
[facility]  [address] [place],  [place]  [address] [phone]  Clinic Day:  07/22/2020  Referring physician: [name], MD   HISTORY OF PRESENT ILLNESS:  The patient is a 69 y.o. male with metastatic prostate cancer, which includes spread of disease to his bones and retroperitoneal lymph nodes.  He is currently on Lupron/enzalutamide to keep his disease under control.  He also previously underwent palliative radiation to his L3 vertebral body to treat the pain associated with his metastatic bone disease.  He comes in today to reassess his metastatic prostate cancer.  Since his enzalutamide was decreased to 120 mg, he has done slightly better.  His major issue today remains persistent lower back pain despite receiving palliative radiation to this area.  Although he had initially stopped it, he got back to taking extended release morphine 30 mg BID.  He also takes MSIR 15 mg every 4 hours.  Despite taking these medications, his pain can be as high as a 9 out of 10.  He claims his long-acting pain medication does not work and makes him nauseated.  PHYSICAL EXAM:  Blood pressure (!) 193/92, pulse 82, temperature 97.6 F (36.4 C), resp. rate 16, height 5\' 11"  (1.803 m), weight 211 lb 11.2 oz (96 kg), SpO2 100 %. Wt Readings from Last 3 Encounters:  07/22/20 211 lb 11.2 oz (96 kg)  05/20/20 207 lb (93.9 kg)  06/27/20 213 lb (96.6 kg)   Body mass index is 29.53 kg/m. Performance status (ECOG): 1 Physical Exam Constitutional:      Appearance: Normal appearance. He is not ill-appearing.  HENT:     Mouth/Throat:     Mouth: Mucous membranes are moist.     Pharynx: Oropharynx is clear. No oropharyngeal exudate or posterior oropharyngeal erythema.  Cardiovascular:     Rate and Rhythm: Normal rate and regular rhythm.     Heart sounds: No murmur heard. No friction rub. No gallop.   Pulmonary:     Effort: Pulmonary effort is normal. No respiratory  distress.     Breath sounds: Normal breath sounds. No wheezing, rhonchi or rales.  Chest:  Breasts:     Right: No axillary adenopathy or supraclavicular adenopathy.     Left: No axillary adenopathy or supraclavicular adenopathy.    Abdominal:     General: Bowel sounds are normal. There is no distension.     Palpations: Abdomen is soft. There is no mass.     Tenderness: There is no abdominal tenderness.  Musculoskeletal:        General: No swelling.     Right lower leg: No edema.     Left lower leg: No edema.  Lymphadenopathy:     Cervical: No cervical adenopathy.     Upper Body:     Right upper body: No supraclavicular or axillary adenopathy.     Left upper body: No supraclavicular or axillary adenopathy.     Lower Body: No right inguinal adenopathy. No left inguinal adenopathy.  Skin:    General: Skin is warm.     Coloration: Skin is not jaundiced.     Findings: No lesion or rash.  Neurological:     General: No focal deficit present.     Mental Status: He is alert and oriented to person, place, and time. Mental status is at baseline.     Cranial Nerves: Cranial nerves are intact.  Psychiatric:        Mood and Affect: Mood  normal.        Behavior: Behavior normal.        Thought Content: Thought content normal.     LABS:    Ref. Range 07/20/2020 08:38  Prostate Specific Ag, Serum Latest Ref Range: 0.0 - 4.0 ng/mL <0.1      ASSESSMENT & PLAN:  Assessment/Plan:  A 69 y.o. male with metastatic prostate cancer.  I am pleased as his PSA level remains at an undetectable level while on his complete androgen blockade therapy of Lupron/enzalutamide.  For now, he knows to continue taking enzalutamide at 120 mg daily.  He will continue to receive Lupron injections every 3 months.   With respect to his back pain, his extended-release morphine will be switched to oxycodone extended release 40 mg BID.  He will continue to take MSIR 15 mg every 4-6 hours as needed for breakthrough  pain.  Although I have told him that extended pain relief medication's main goal is for prolonged pain control, I do not get the sense he truly understands this.  As his back pain remains prominent, I will have radiation oncology re-evaluate and consider additional palliative radiation to the area.  He will continue to receive Zometa on a monthly basis to protect his bones against worsening metastatic disease.  I will see this patient back in 2 months for repeat clinical assessment.  The patient understands all the plans discussed today and is in agreement with them.   Christelle Igoe Macarthur Critchley, MD

## 2020-07-22 ENCOUNTER — Encounter: Payer: Self-pay | Admitting: Oncology

## 2020-07-22 ENCOUNTER — Other Ambulatory Visit: Payer: Self-pay | Admitting: Oncology

## 2020-07-22 ENCOUNTER — Other Ambulatory Visit: Payer: Medicare Other

## 2020-07-22 ENCOUNTER — Inpatient Hospital Stay (INDEPENDENT_AMBULATORY_CARE_PROVIDER_SITE_OTHER): Payer: Medicare Other | Admitting: Oncology

## 2020-07-22 ENCOUNTER — Telehealth: Payer: Self-pay | Admitting: Oncology

## 2020-07-22 VITALS — BP 193/92 | HR 82 | Temp 97.6°F | Resp 16 | Ht 71.0 in | Wt 211.7 lb

## 2020-07-22 DIAGNOSIS — C772 Secondary and unspecified malignant neoplasm of intra-abdominal lymph nodes: Secondary | ICD-10-CM

## 2020-07-22 DIAGNOSIS — M5459 Other low back pain: Secondary | ICD-10-CM | POA: Diagnosis not present

## 2020-07-22 DIAGNOSIS — Z79899 Other long term (current) drug therapy: Secondary | ICD-10-CM | POA: Diagnosis not present

## 2020-07-22 DIAGNOSIS — C61 Malignant neoplasm of prostate: Secondary | ICD-10-CM

## 2020-07-22 DIAGNOSIS — C7951 Secondary malignant neoplasm of bone: Secondary | ICD-10-CM | POA: Diagnosis not present

## 2020-07-22 MED ORDER — OXYCODONE HCL ER 40 MG PO T12A: 40.0000 mg | EXTENDED_RELEASE_TABLET | Freq: Two times a day (BID) | ORAL | 0 refills | Status: DC

## 2020-07-22 NOTE — Telephone Encounter (Signed)
Per 12/17 los next appt given to patient

## 2020-07-25 ENCOUNTER — Other Ambulatory Visit: Payer: Self-pay

## 2020-07-25 ENCOUNTER — Other Ambulatory Visit: Payer: Self-pay | Admitting: Hematology and Oncology

## 2020-07-25 ENCOUNTER — Ambulatory Visit: Payer: Medicare Other | Admitting: Oncology

## 2020-07-25 ENCOUNTER — Inpatient Hospital Stay: Payer: Medicare Other

## 2020-07-25 VITALS — BP 177/71 | HR 82 | Temp 98.0°F | Resp 20 | Ht 71.0 in | Wt 204.5 lb

## 2020-07-25 DIAGNOSIS — C61 Malignant neoplasm of prostate: Secondary | ICD-10-CM | POA: Diagnosis not present

## 2020-07-25 DIAGNOSIS — C7952 Secondary malignant neoplasm of bone marrow: Secondary | ICD-10-CM

## 2020-07-25 DIAGNOSIS — C7951 Secondary malignant neoplasm of bone: Secondary | ICD-10-CM | POA: Diagnosis not present

## 2020-07-25 MED ORDER — HYDROMORPHONE HCL 1 MG/ML IJ SOLN
INTRAMUSCULAR | Status: AC
  Filled 2020-07-25: qty 1

## 2020-07-25 MED ORDER — ZOLEDRONIC ACID 4 MG/100ML IV SOLN
INTRAVENOUS | Status: AC
  Filled 2020-07-25: qty 100

## 2020-07-25 MED ORDER — SODIUM CHLORIDE 0.9 % IV SOLN
Freq: Once | INTRAVENOUS | Status: AC
  Filled 2020-07-25: qty 250

## 2020-07-25 MED ORDER — ZOLEDRONIC ACID 4 MG/100ML IV SOLN
4.0000 mg | Freq: Once | INTRAVENOUS | Status: AC
  Administered 2020-07-25: 4 mg via INTRAVENOUS

## 2020-07-25 MED ORDER — HYDROMORPHONE HCL 1 MG/ML IJ SOLN
1.0000 mg | Freq: Once | INTRAMUSCULAR | Status: AC
Start: 2020-07-25 — End: 2020-07-25
  Administered 2020-07-25: 1 mg via INTRAVENOUS

## 2020-07-25 NOTE — Progress Notes (Signed)
Pt stable at time of discharge-rates back pain  "2" on scale of 0-10 after receiving Dilaudid IV 1mg  dose.  Pt stable on feet, says that his wife is waiting in vehicle to drive pt home.

## 2020-07-26 ENCOUNTER — Telehealth: Payer: Self-pay

## 2020-07-26 ENCOUNTER — Other Ambulatory Visit: Payer: Self-pay | Admitting: Hematology and Oncology

## 2020-07-26 MED ORDER — OXYCODONE HCL ER 20 MG PO T12A
40.0000 mg | EXTENDED_RELEASE_TABLET | Freq: Two times a day (BID) | ORAL | 0 refills | Status: DC
Start: 2020-07-26 — End: 2020-09-01

## 2020-07-26 NOTE — Telephone Encounter (Signed)
@   1239- I spoke to pt's wife, & told her eRx had been sent to [facility].   I called [facility] [place]. They do not have oxycodone 40 mg, & do not know when they will get any of those again. They do have long acting 20 mg Oxycodone, 40 tabs. I gave that information to [facility],NP. She will eRx for pt to take 2 of the Oxycodone 20 mg po BID to get pt by @ this time.

## 2020-08-12 ENCOUNTER — Inpatient Hospital Stay: Payer: Medicare Other | Attending: Oncology

## 2020-08-12 ENCOUNTER — Other Ambulatory Visit: Payer: Self-pay

## 2020-08-12 VITALS — BP 170/80 | HR 78 | Temp 97.7°F | Resp 18 | Ht 71.0 in | Wt 211.8 lb

## 2020-08-12 DIAGNOSIS — Z5111 Encounter for antineoplastic chemotherapy: Secondary | ICD-10-CM | POA: Insufficient documentation

## 2020-08-12 DIAGNOSIS — C61 Malignant neoplasm of prostate: Secondary | ICD-10-CM | POA: Insufficient documentation

## 2020-08-12 DIAGNOSIS — C7951 Secondary malignant neoplasm of bone: Secondary | ICD-10-CM | POA: Insufficient documentation

## 2020-08-12 DIAGNOSIS — Z79899 Other long term (current) drug therapy: Secondary | ICD-10-CM | POA: Insufficient documentation

## 2020-08-12 MED ORDER — LEUPROLIDE ACETATE (3 MONTH) 22.5 MG IM KIT
PACK | INTRAMUSCULAR | Status: AC
  Filled 2020-08-12: qty 22.5

## 2020-08-12 MED ORDER — LEUPROLIDE ACETATE (3 MONTH) 22.5 MG IM KIT: 22.5000 mg | PACK | Freq: Once | INTRAMUSCULAR | Status: DC

## 2020-08-12 NOTE — Progress Notes (Signed)
Pt presents with c/o CP.  VS checked, BP 200/103, 203/96-EMS called-pt took home med Nitro tab, next BP 180/96- EMS arrives-pt refuses to go with EMS.  VS checked again, BP improved 170/80 (VS as per chart) pt denies CP at this time.

## 2020-08-12 NOTE — Patient Instructions (Signed)
F/U with cardiologist regarding CP.

## 2020-08-22 ENCOUNTER — Other Ambulatory Visit: Payer: Self-pay

## 2020-08-22 ENCOUNTER — Ambulatory Visit: Payer: Medicare Other

## 2020-08-25 ENCOUNTER — Other Ambulatory Visit: Payer: Self-pay

## 2020-08-25 ENCOUNTER — Encounter: Payer: Self-pay | Admitting: Emergency Medicine

## 2020-08-25 ENCOUNTER — Emergency Department: Payer: Medicare Other

## 2020-08-25 ENCOUNTER — Emergency Department
Admission: EM | Admit: 2020-08-25 | Discharge: 2020-08-25 | Disposition: A | Discharge status: Home / Self Care | Payer: Medicare Other | Attending: Emergency Medicine | Admitting: Emergency Medicine

## 2020-08-25 DIAGNOSIS — M25512 Pain in left shoulder: Secondary | ICD-10-CM | POA: Insufficient documentation

## 2020-08-25 DIAGNOSIS — R6884 Jaw pain: Secondary | ICD-10-CM | POA: Insufficient documentation

## 2020-08-25 DIAGNOSIS — R42 Dizziness and giddiness: Secondary | ICD-10-CM | POA: Diagnosis not present

## 2020-08-25 DIAGNOSIS — R110 Nausea: Secondary | ICD-10-CM | POA: Diagnosis not present

## 2020-08-25 DIAGNOSIS — R079 Chest pain, unspecified: Secondary | ICD-10-CM | POA: Diagnosis not present

## 2020-08-25 DIAGNOSIS — Z5321 Procedure and treatment not carried out due to patient leaving prior to being seen by health care provider: Secondary | ICD-10-CM | POA: Insufficient documentation

## 2020-08-25 DIAGNOSIS — R0602 Shortness of breath: Secondary | ICD-10-CM | POA: Diagnosis not present

## 2020-08-25 LAB — BASIC METABOLIC PANEL
Anion gap: 13 (ref 5–15)
BUN: 14 mg/dL (ref 8–23)
CO2: 24 mmol/L (ref 22–32)
Calcium: 9.4 mg/dL (ref 8.9–10.3)
Chloride: 104 mmol/L (ref 98–111)
Creatinine, Ser: 1.18 mg/dL (ref 0.61–1.24)
GFR, Estimated: >60 mL/min (ref >60)
Glucose, Bld: 94 mg/dL (ref 70–99)
Potassium: 3.9 mmol/L (ref 3.5–5.1)
Sodium: 141 mmol/L (ref 135–145)

## 2020-08-25 LAB — CBC
HCT: 41.8 % (ref 39.0–52.0)
Hemoglobin: 14 g/dL (ref 13.0–17.0)
MCH: 29.7 pg (ref 26.0–34.0)
MCHC: 33.5 g/dL (ref 30.0–36.0)
MCV: 88.6 fL (ref 80.0–100.0)
Platelets: 237 10*3/uL (ref 150–400)
RBC: 4.72 MIL/uL (ref 4.22–5.81)
RDW: 13.2 % (ref 11.5–15.5)
WBC: 8.6 10*3/uL (ref 4.0–10.5)
nRBC: 0 % (ref 0.0–0.2)

## 2020-08-25 LAB — TROPONIN I (HIGH SENSITIVITY)
Troponin I (High Sensitivity): 10 ng/L (ref <18)
Troponin I (High Sensitivity): 11 ng/L (ref <18)

## 2020-08-25 NOTE — ED Triage Notes (Signed)
Pt comes into the ED via POV c/o central chest pain that radiates to the left shoulder and jaw.  Pain started last night, patient took 1 nitro and the pain subsided.  Pt states the pain returned today.  H/o MI in the past.  Pt states he also had Nausea, SHOB, and dizziness associated with the chest pain.  Pt currently has even and unlabored respirations and is ambulatory to triage.

## 2020-08-31 NOTE — Addendum Note (Signed)
Addended by: Juanetta Beets on: 08/31/2020 12:24 PM   Modules accepted: Orders

## 2020-09-01 ENCOUNTER — Inpatient Hospital Stay: Payer: Medicare Other

## 2020-09-01 ENCOUNTER — Other Ambulatory Visit: Payer: Self-pay | Admitting: Hematology and Oncology

## 2020-09-01 ENCOUNTER — Other Ambulatory Visit: Payer: Self-pay

## 2020-09-01 VITALS — BP 167/84 | HR 66 | Resp 18 | Ht 71.0 in | Wt 213.0 lb

## 2020-09-01 DIAGNOSIS — C61 Malignant neoplasm of prostate: Secondary | ICD-10-CM

## 2020-09-01 DIAGNOSIS — Z5111 Encounter for antineoplastic chemotherapy: Secondary | ICD-10-CM | POA: Diagnosis not present

## 2020-09-01 DIAGNOSIS — C7952 Secondary malignant neoplasm of bone marrow: Secondary | ICD-10-CM

## 2020-09-01 DIAGNOSIS — Z79899 Other long term (current) drug therapy: Secondary | ICD-10-CM | POA: Diagnosis not present

## 2020-09-01 DIAGNOSIS — C7951 Secondary malignant neoplasm of bone: Secondary | ICD-10-CM

## 2020-09-01 MED ORDER — MORPHINE SULFATE ER 30 MG PO TBCR: 30.0000 mg | EXTENDED_RELEASE_TABLET | Freq: Two times a day (BID) | ORAL | 0 refills | Status: DC

## 2020-09-01 MED ORDER — LEUPROLIDE ACETATE (3 MONTH) 22.5 MG IM KIT
22.5000 mg | PACK | Freq: Once | INTRAMUSCULAR | Status: AC
  Administered 2020-09-01: 22.5 mg via INTRAMUSCULAR

## 2020-09-01 MED ORDER — ZOLEDRONIC ACID 4 MG/100ML IV SOLN
4.0000 mg | Freq: Once | INTRAVENOUS | Status: AC
  Administered 2020-09-01: 4 mg via INTRAVENOUS

## 2020-09-01 MED ORDER — ZOLEDRONIC ACID 4 MG/100ML IV SOLN
INTRAVENOUS | Status: AC
  Filled 2020-09-01: qty 100

## 2020-09-01 MED ORDER — LEUPROLIDE ACETATE (3 MONTH) 22.5 MG IM KIT
PACK | INTRAMUSCULAR | Status: AC
  Filled 2020-09-01: qty 22.5

## 2020-09-01 MED ORDER — MORPHINE SULFATE 15 MG PO TABS: 15.0000 mg | ORAL_TABLET | ORAL | 0 refills | Status: DC | PRN

## 2020-09-01 MED ORDER — SODIUM CHLORIDE 0.9 % IV SOLN
Freq: Once | INTRAVENOUS | Status: AC
  Filled 2020-09-01: qty 250

## 2020-09-01 NOTE — Patient Instructions (Signed)
Leuprolide injection What is this medicine? LEUPROLIDE (loo PROE lide) is a man-made hormone. It is used to treat the symptoms of prostate cancer. This medicine may also be used to treat children with early onset of puberty. It may be used for other hormonal conditions. This medicine may be used for other purposes; ask your health care provider or pharmacist if you have questions. COMMON BRAND NAME(S): Lupron What should I tell my health care provider before I take this medicine? They need to know if you have any of these conditions:  diabetes  heart disease or previous heart attack  high blood pressure  high cholesterol  pain or difficulty passing urine  spinal cord metastasis  stroke  tobacco smoker  an unusual or allergic reaction to leuprolide, benzyl alcohol, other medicines, foods, dyes, or preservatives  pregnant or trying to get pregnant  breast-feeding How should I use this medicine? This medicine is for injection under the skin or into a muscle. You will be taught how to prepare and give this medicine. Use exactly as directed. Take your medicine at regular intervals. Do not take your medicine more often than directed. It is important that you put your used needles and syringes in a special sharps container. Do not put them in a trash can. If you do not have a sharps container, call your pharmacist or healthcare provider to get one. A special MedGuide will be given to you by the pharmacist with each prescription and refill. Be sure to read this information carefully each time. Talk to your pediatrician regarding the use of this medicine in children. While this medicine may be prescribed for children as young as 8 years for selected conditions, precautions do apply. Overdosage: If you think you have taken too much of this medicine contact a poison control center or emergency room at once. NOTE: This medicine is only for you. Do not share this medicine with others. What if  I miss a dose? If you miss a dose, take it as soon as you can. If it is almost time for your next dose, take only that dose. Do not take double or extra doses. What may interact with this medicine? Do not take this medicine with any of the following medications:  chasteberry  cisapride  dronedarone  pimozide  thioridazine This medicine may also interact with the following medications:  herbal or dietary supplements, like black cohosh or DHEA  male hormones, like estrogens or progestins and birth control pills, patches, rings, or injections  male hormones, like testosterone  other medicines that prolong the QT interval (abnormal heart rhythm) This list may not describe all possible interactions. Give your health care provider a list of all the medicines, herbs, non-prescription drugs, or dietary supplements you use. Also tell them if you smoke, drink alcohol, or use illegal drugs. Some items may interact with your medicine. What should I watch for while using this medicine? Visit your doctor or health care professional for regular checks on your progress. During the first week, your symptoms may get worse, but then will improve as you continue your treatment. You may get hot flashes, increased bone pain, increased difficulty passing urine, or an aggravation of nerve symptoms. Discuss these effects with your doctor or health care professional, some of them may improve with continued use of this medicine. Male patients may experience a menstrual cycle or spotting during the first 2 months of therapy with this medicine. If this continues, contact your doctor or health care professional.   This medicine may increase blood sugar. Ask your healthcare provider if changes in diet or medicines are needed if you have diabetes. What side effects may I notice from receiving this medicine? Side effects that you should report to your doctor or health care professional as soon as possible:  allergic  reactions like skin rash, itching or hives, swelling of the face, lips, or tongue  breathing problems  chest pain  depression or memory disorders  pain in your legs or groin  pain at site where injected  severe headache  signs and symptoms of high blood sugar such as being more thirsty or hungry or having to urinate more than normal. You may also feel very tired or have blurry vision  swelling of the feet and legs  visual changes  vomiting Side effects that usually do not require medical attention (report to your doctor or health care professional if they continue or are bothersome):  breast swelling or tenderness  decrease in sex drive or performance  diarrhea  hot flashes  loss of appetite  muscle, joint, or bone pains  nausea  redness or irritation at site where injected  skin problems or acne This list may not describe all possible side effects. Call your doctor for medical advice about side effects. You may report side effects to FDA at 1-800-FDA-1088. Where should I keep my medicine? Keep out of the reach of children. Store below 25 degrees C (77 degrees F). Do not freeze. Protect from light. Do not use if it is not clear or if there are particles present. Throw away any unused medicine after the expiration date. NOTE: This sheet is a summary. It may not cover all possible information. If you have questions about this medicine, talk to your doctor, pharmacist, or health care provider.  2021 Elsevier/Gold Standard (2019-06-24 10:57:41)   Zoledronic Acid Injection (Hypercalcemia, Oncology) What is this medicine? ZOLEDRONIC ACID (ZOE le dron ik AS id) slows calcium loss from bones. It high calcium levels in the blood from some kinds of cancer. It may be used in other people at risk for bone loss. This medicine may be used for other purposes; ask your health care provider or pharmacist if you have questions. COMMON BRAND NAME(S): Zometa What should I tell my  health care provider before I take this medicine? They need to know if you have any of these conditions:  cancer  dehydration  dental disease  kidney disease  liver disease  low levels of calcium in the blood  lung or breathing disease (asthma)  receiving steroids like dexamethasone or prednisone  an unusual or allergic reaction to zoledronic acid, other medicines, foods, dyes, or preservatives  pregnant or trying to get pregnant  breast-feeding How should I use this medicine? This drug is injected into a vein. It is given by a health care provider in a hospital or clinic setting. Talk to your health care provider about the use of this drug in children. Special care may be needed. Overdosage: If you think you have taken too much of this medicine contact a poison control center or emergency room at once. NOTE: This medicine is only for you. Do not share this medicine with others. What if I miss a dose? Keep appointments for follow-up doses. It is important not to miss your dose. Call your health care provider if you are unable to keep an appointment. What may interact with this medicine?  certain antibiotics given by injection  NSAIDs, medicines for pain and   inflammation, like ibuprofen or naproxen  some diuretics like bumetanide, furosemide  teriparatide  thalidomide This list may not describe all possible interactions. Give your health care provider a list of all the medicines, herbs, non-prescription drugs, or dietary supplements you use. Also tell them if you smoke, drink alcohol, or use illegal drugs. Some items may interact with your medicine. What should I watch for while using this medicine? Visit your health care provider for regular checks on your progress. It may be some time before you see the benefit from this drug. Some people who take this drug have severe bone, joint, or muscle pain. This drug may also increase your risk for jaw problems or a broken thigh  bone. Tell your health care provider right away if you have severe pain in your jaw, bones, joints, or muscles. Tell you health care provider if you have any pain that does not go away or that gets worse. Tell your dentist and dental surgeon that you are taking this drug. You should not have major dental surgery while on this drug. See your dentist to have a dental exam and fix any dental problems before starting this drug. Take good care of your teeth while on this drug. Make sure you see your dentist for regular follow-up appointments. You should make sure you get enough calcium and vitamin D while you are taking this drug. Discuss the foods you eat and the vitamins you take with your health care provider. Check with your health care provider if you have severe diarrhea, nausea, and vomiting, or if you sweat a lot. The loss of too much body fluid may make it dangerous for you to take this drug. You may need blood work done while you are taking this drug. Do not become pregnant while taking this drug. Women should inform their health care provider if they wish to become pregnant or think they might be pregnant. There is potential for serious harm to an unborn child. Talk to your health care provider for more information. What side effects may I notice from receiving this medicine? Side effects that you should report to your doctor or health care provider as soon as possible:  allergic reactions (skin rash, itching or hives; swelling of the face, lips, or tongue)  bone pain  infection (fever, chills, cough, sore throat, pain or trouble passing urine)  jaw pain, especially after dental work  joint pain  kidney injury (trouble passing urine or change in the amount of urine)  low blood pressure (dizziness; feeling faint or lightheaded, falls; unusually weak or tired)  low calcium levels (fast heartbeat; muscle cramps or pain; pain, tingling, or numbness in the hands or feet; seizures)  low  magnesium levels (fast, irregular heartbeat; muscle cramp or pain; muscle weakness; tremors; seizures)  low red blood cell counts (trouble breathing; feeling faint; lightheaded, falls; unusually weak or tired)  muscle pain  redness, blistering, peeling, or loosening of the skin, including inside the mouth  severe diarrhea  swelling of the ankles, feet, hands  trouble breathing Side effects that usually do not require medical attention (report to your doctor or health care provider if they continue or are bothersome):  anxious  constipation  coughing  depressed mood  eye irritation, itching, or pain  fever  general ill feeling or flu-like symptoms  nausea  pain, redness, or irritation at site where injected  trouble sleeping This list may not describe all possible side effects. Call your doctor for medical advice about   side effects. You may report side effects to FDA at 1-800-FDA-1088. Where should I keep my medicine? This drug is given in a hospital or clinic. It will not be stored at home. NOTE: This sheet is a summary. It may not cover all possible information. If you have questions about this medicine, talk to your doctor, pharmacist, or health care provider.  2021 Elsevier/Gold Standard (2019-05-07 09:13:00)  

## 2020-09-21 ENCOUNTER — Other Ambulatory Visit: Payer: Self-pay | Admitting: Hematology and Oncology

## 2020-09-21 ENCOUNTER — Other Ambulatory Visit: Payer: Self-pay

## 2020-09-21 ENCOUNTER — Inpatient Hospital Stay: Payer: Medicare Other | Attending: Oncology

## 2020-09-21 DIAGNOSIS — C61 Malignant neoplasm of prostate: Secondary | ICD-10-CM | POA: Insufficient documentation

## 2020-09-21 DIAGNOSIS — D649 Anemia, unspecified: Secondary | ICD-10-CM | POA: Diagnosis not present

## 2020-09-21 LAB — BASIC METABOLIC PANEL
BUN: 13 (ref 4–21)
CO2: 25 — AB (ref 13–22)
Chloride: 107 (ref 99–108)
Creatinine: 1.1 (ref 0.6–1.3)
Glucose: 116
Potassium: 4.2 (ref 3.4–5.3)
Sodium: 139 (ref 137–147)

## 2020-09-21 LAB — COMPREHENSIVE METABOLIC PANEL
Albumin: 4.4 (ref 3.5–5.0)
Calcium: 8.5 — AB (ref 8.7–10.7)

## 2020-09-21 LAB — HEPATIC FUNCTION PANEL
ALT: 16 (ref 10–40)
AST: 24 (ref 14–40)
Alkaline Phosphatase: 80 (ref 25–125)
Bilirubin, Total: 0.5

## 2020-09-21 LAB — CBC AND DIFFERENTIAL
HCT: 43 (ref 41–53)
Hemoglobin: 14.5 (ref 13.5–17.5)
Neutrophils Absolute: 3.75
Platelets: 238 (ref 150–399)
WBC: 5.6

## 2020-09-21 LAB — CBC: RBC: 4.81 (ref 3.87–5.11)

## 2020-09-21 NOTE — Progress Notes (Signed)
[facility]  [address] [place],  [place]  [address] [phone]  Clinic Day:  09/22/2020  Referring physician: [name], MD   HISTORY OF PRESENT ILLNESS:  The patient is a 70 y.o. male with metastatic prostate cancer, which includes spread of disease to his bones and retroperitoneal lymph nodes.  He is currently on Lupron/enzalutamide to keep his disease under control.  He also previously underwent palliative radiation to his L3 vertebral body to treat the pain associated with his metastatic bone disease.  He comes in today to reassess his metastatic prostate cancer.  His major issue today remains persistent lower back pain despite receiving palliative radiation to this area.  He also takes long-acting oxycodone and MSIR for his pain regimen.    PHYSICAL EXAM:  Blood pressure (!) 215/96, pulse 64, temperature 97.8 F (36.6 C), resp. rate 18, height 5\' 11"  (1.803 m), weight 210 lb 1.6 oz (95.3 kg), SpO2 99 %. Wt Readings from Last 3 Encounters:  09/30/20 211 lb 9.6 oz (96 kg)  09/22/20 210 lb 1.6 oz (95.3 kg)  09/01/20 213 lb 0.1 oz (96.6 kg)   Body mass index is 29.3 kg/m. Performance status (ECOG): 1 Physical Exam Constitutional:      Appearance: Normal appearance. He is not ill-appearing.  HENT:     Mouth/Throat:     Mouth: Mucous membranes are moist.     Pharynx: Oropharynx is clear. No oropharyngeal exudate or posterior oropharyngeal erythema.  Cardiovascular:     Rate and Rhythm: Normal rate and regular rhythm.     Heart sounds: No murmur heard. No friction rub. No gallop.   Pulmonary:     Effort: Pulmonary effort is normal. No respiratory distress.     Breath sounds: Normal breath sounds. No wheezing, rhonchi or rales.  Chest:  Breasts:     Right: No axillary adenopathy or supraclavicular adenopathy.     Left: No axillary adenopathy or supraclavicular adenopathy.    Abdominal:     General: Bowel sounds are normal. There  is no distension.     Palpations: Abdomen is soft. There is no mass.     Tenderness: There is no abdominal tenderness.  Musculoskeletal:        General: No swelling.     Right lower leg: No edema.     Left lower leg: No edema.  Lymphadenopathy:     Cervical: No cervical adenopathy.     Upper Body:     Right upper body: No supraclavicular or axillary adenopathy.     Left upper body: No supraclavicular or axillary adenopathy.     Lower Body: No right inguinal adenopathy. No left inguinal adenopathy.  Skin:    General: Skin is warm.     Coloration: Skin is not jaundiced.     Findings: No lesion or rash.  Neurological:     General: No focal deficit present.     Mental Status: He is alert and oriented to person, place, and time. Mental status is at baseline.     Cranial Nerves: Cranial nerves are intact.  Psychiatric:        Mood and Affect: Mood normal.        Behavior: Behavior normal.        Thought Content: Thought content normal.     LABS:    Ref. Range 09/21/2020 09:50  Prostate Specific Ag, Serum Latest Ref Range: 0.0 - 4.0 ng/mL 0.1  ASSESSMENT & PLAN:  Assessment/Plan:  A 70 y.o. male with metastatic prostate cancer.  Although his PSA is back to being detectable, it remains very low at only 0.1. He knows to continue taking enzalutamide at 120 mg daily.  He will continue to receive Lupron injections every 3 months.   With respect to his back pain, as it persists despite his pain regimen, I will have him undergo a bone scan next week to see if there is any active disease in this and other regions to where additional radiation may need to be considered.  He will continue to receive Zometa on a monthly basis to protect his bones against worsening metastatic disease.  I will see this patient back next week to go over his bone scan results and their implications.  The patient understands all the plans discussed today and is in agreement with them.   Jonathan Correa Macarthur Critchley, MD

## 2020-09-22 ENCOUNTER — Inpatient Hospital Stay: Payer: Medicare Other | Admitting: Oncology

## 2020-09-22 ENCOUNTER — Telehealth: Payer: Self-pay | Admitting: Oncology

## 2020-09-22 VITALS — BP 215/96 | HR 64 | Temp 97.8°F | Resp 18 | Ht 71.0 in | Wt 210.1 lb

## 2020-09-22 DIAGNOSIS — C61 Malignant neoplasm of prostate: Secondary | ICD-10-CM

## 2020-09-22 LAB — PROSTATE-SPECIFIC AG, SERUM (LABCORP): Prostate Specific Ag, Serum: 0.1 ng/mL (ref 0.0–4.0)

## 2020-09-22 NOTE — Telephone Encounter (Signed)
Per 2/17 los next appt sched and given to patient 

## 2020-09-28 DIAGNOSIS — C7951 Secondary malignant neoplasm of bone: Secondary | ICD-10-CM | POA: Diagnosis not present

## 2020-09-29 NOTE — Progress Notes (Signed)
[facility]  [address] [place],  [place]  [address] [phone]  Clinic Day:  09/30/2020  Referring physician: [name], MD   HISTORY OF PRESENT ILLNESS:  The patient is a 70 y.o. male with metastatic prostate cancer, which includes spread of disease to his bones and retroperitoneal lymph nodes.  He is currently on Lupron/enzalutamide to keep his disease under control.  He also previously underwent palliative radiation to his L3 vertebral body to treat the pain associated with his metastatic bone disease.  He comes in today to go over his bone scan, which was done as he continues to complain of severe back pain.  This is despite his most recent PSA being undetectable.  Overall, he is doing okay, but he continues to complain of his same back pain.  PHYSICAL EXAM:  Blood pressure (!) 208/99, pulse 76, temperature 98 F (36.7 C), resp. rate 18, height 5\' 11"  (1.803 m), weight 211 lb 9.6 oz (96 kg), SpO2 99 %. Wt Readings from Last 3 Encounters:  09/30/20 211 lb 9.6 oz (96 kg)  09/22/20 210 lb 1.6 oz (95.3 kg)  09/01/20 213 lb 0.1 oz (96.6 kg)   Body mass index is 29.51 kg/m. Performance status (ECOG): 1 Physical Exam Constitutional:      Appearance: Normal appearance. He is not ill-appearing.  HENT:     Mouth/Throat:     Mouth: Mucous membranes are moist.     Pharynx: Oropharynx is clear. No oropharyngeal exudate or posterior oropharyngeal erythema.  Cardiovascular:     Rate and Rhythm: Normal rate and regular rhythm.     Heart sounds: No murmur heard. No friction rub. No gallop.   Pulmonary:     Effort: Pulmonary effort is normal. No respiratory distress.     Breath sounds: Normal breath sounds. No wheezing, rhonchi or rales.  Chest:  Breasts:     Right: No axillary adenopathy or supraclavicular adenopathy.     Left: No axillary adenopathy or supraclavicular adenopathy.    Abdominal:     General: Bowel sounds are normal. There is  no distension.     Palpations: Abdomen is soft. There is no mass.     Tenderness: There is no abdominal tenderness.  Musculoskeletal:        General: No swelling.     Right lower leg: No edema.     Left lower leg: No edema.  Lymphadenopathy:     Cervical: No cervical adenopathy.     Upper Body:     Right upper body: No supraclavicular or axillary adenopathy.     Left upper body: No supraclavicular or axillary adenopathy.     Lower Body: No right inguinal adenopathy. No left inguinal adenopathy.  Skin:    General: Skin is warm.     Coloration: Skin is not jaundiced.     Findings: No lesion or rash.  Neurological:     General: No focal deficit present.     Mental Status: He is alert and oriented to person, place, and time. Mental status is at baseline.     Cranial Nerves: Cranial nerves are intact.  Psychiatric:        Mood and Affect: Mood normal.        Behavior: Behavior normal.        Thought Content: Thought content normal.   SCAN:  His bone scan done yesterday revealed the following: FINDINGS: Interval improvement in the degree of tracer uptake associated with the posterolateral  aspect of the right eighth rib.  Stable to mild improvement in degree of tracer uptake associated with the sclerotic lesion involving the posterior of the left seventh rib.  Stable to mild improvement in degree of tracer uptake associated with the sclerotic lesion involving the left L3 pedicle.  Interval resolution in increased tracer uptake localizing to anterior right fourth and fifth rib interspace.  There are no new sites of disease.  Physiologic tracer uptake is noted within the kidneys an urinary bladder.  IMPRESSION: 1. Mild improvement in degree of FDG uptake associated with multifocal rib and lumbar spine metastasis.   LABS:     Ref. Range 09/21/2020 09:50  Prostate Specific Ag, Serum Latest Ref Range: 0.0 - 4.0 ng/mL 0.1   ASSESSMENT & PLAN:  Assessment/Plan:  A 70 y.o.  male with metastatic prostate cancer.  In clinic today, I went over his bone scan images with them, which clearly shows an improvement in his metastatic bone disease.  However, it does appear persistent disease is in his lumbar spine, which is the area where his pain is. I did speak with radiation oncology, who feels additional palliative radiation can be given to the lumbar region to help with his pain control.  He will be sent to them to have this done.  For now, he knows to continue his complete androgen blockade therapy of Lupron/enzalutamide.  With respect to his back pain, his oxycodone extended release will be maintained at 40 mg BID.  He will continue to take MSIR 15 mg every 4-6 hours as needed for breakthrough pain.  He will continue to receive Zometa on a monthly basis to protect his bones against worsening metastatic disease.  I will see this patient back in 2 months for repeat clinical assessment.  The patient understands all the plans discussed today and is in agreement with them.   Auset Fritzler Macarthur Critchley, MD

## 2020-09-30 ENCOUNTER — Inpatient Hospital Stay: Payer: Medicare Other | Admitting: Oncology

## 2020-09-30 ENCOUNTER — Telehealth: Payer: Self-pay | Admitting: Oncology

## 2020-09-30 ENCOUNTER — Other Ambulatory Visit: Payer: Self-pay

## 2020-09-30 ENCOUNTER — Other Ambulatory Visit: Payer: Self-pay | Admitting: Oncology

## 2020-09-30 VITALS — BP 208/99 | HR 76 | Temp 98.0°F | Resp 18 | Ht 71.0 in | Wt 211.6 lb

## 2020-09-30 DIAGNOSIS — C61 Malignant neoplasm of prostate: Secondary | ICD-10-CM

## 2020-09-30 NOTE — Telephone Encounter (Signed)
Per 2/25 LOS, patient scheduled for April Appt's Labs, Follow up.  Also scheduled for Labs, Inj March, April - Gave patient Appt Summary

## 2020-09-30 NOTE — Progress Notes (Incomplete)
[facility]  [address] [place],  [place]  [address] [phone]  Clinic Day:  09/22/2020  Referring physician: [name], MD   HISTORY OF PRESENT ILLNESS:  The patient is a 70 y.o. male with metastatic prostate cancer, which includes spread of disease to his bones and retroperitoneal lymph nodes.  He is currently on Lupron/enzalutamide to keep his disease under control.  He also previously underwent palliative radiation to his L3 vertebral body to treat the pain associated with his metastatic bone disease.  He comes in today to reassess his metastatic prostate cancer.  His major issue today remains persistent lower back pain despite receiving palliative radiation to this area.  He also takes long-acting oxycodone and MSIR for his pain regimen.    PHYSICAL EXAM:  Blood pressure (!) 215/96, pulse 64, temperature 97.8 F (36.6 C), resp. rate 18, height 5\' 11"  (1.803 m), weight 210 lb 1.6 oz (95.3 kg), SpO2 99 %. Wt Readings from Last 3 Encounters:  09/30/20 211 lb 9.6 oz (96 kg)  09/22/20 210 lb 1.6 oz (95.3 kg)  09/01/20 213 lb 0.1 oz (96.6 kg)   Body mass index is 29.3 kg/m. Performance status (ECOG): 1 Physical Exam Constitutional:      Appearance: Normal appearance. He is not ill-appearing.  HENT:     Mouth/Throat:     Mouth: Mucous membranes are moist.     Pharynx: Oropharynx is clear. No oropharyngeal exudate or posterior oropharyngeal erythema.  Cardiovascular:     Rate and Rhythm: Normal rate and regular rhythm.     Heart sounds: No murmur heard. No friction rub. No gallop.   Pulmonary:     Effort: Pulmonary effort is normal. No respiratory distress.     Breath sounds: Normal breath sounds. No wheezing, rhonchi or rales.  Chest:  Breasts:     Right: No axillary adenopathy or supraclavicular adenopathy.     Left: No axillary adenopathy or supraclavicular adenopathy.    Abdominal:     General: Bowel sounds are normal. There  is no distension.     Palpations: Abdomen is soft. There is no mass.     Tenderness: There is no abdominal tenderness.  Musculoskeletal:        General: No swelling.     Right lower leg: No edema.     Left lower leg: No edema.  Lymphadenopathy:     Cervical: No cervical adenopathy.     Upper Body:     Right upper body: No supraclavicular or axillary adenopathy.     Left upper body: No supraclavicular or axillary adenopathy.     Lower Body: No right inguinal adenopathy. No left inguinal adenopathy.  Skin:    General: Skin is warm.     Coloration: Skin is not jaundiced.     Findings: No lesion or rash.  Neurological:     General: No focal deficit present.     Mental Status: He is alert and oriented to person, place, and time. Mental status is at baseline.     Cranial Nerves: Cranial nerves are intact.  Psychiatric:        Mood and Affect: Mood normal.        Behavior: Behavior normal.        Thought Content: Thought content normal.     LABS:    Ref. Range 09/21/2020 09:50  Prostate Specific Ag, Serum Latest Ref Range: 0.0 - 4.0 ng/mL 0.1  ASSESSMENT & PLAN:  Assessment/Plan:  A 70 y.o. male with metastatic prostate cancer.  Although his PSA is back to being detectable, it remains very low at only 0.1. He knows to continue taking enzalutamide at 120 mg daily.  He will continue to receive Lupron injections every 3 months.   With respect to his back pain, as it persists despite his pain regimen, I will have him undergo a bone scan next week to see if there is any active disease in this and other regions to where either more palliative radiation or alpharadin can be considered.  He will continue to receive Zometa on a monthly basis to protect his bones against worsening metastatic disease.  I will see this patient back in 2 months for repeat clinical assessment.  The patient understands all the plans discussed today and is in agreement with them.   Dequincy Macarthur Critchley, MD

## 2020-10-05 ENCOUNTER — Other Ambulatory Visit: Payer: Self-pay | Admitting: Hematology and Oncology

## 2020-10-05 ENCOUNTER — Inpatient Hospital Stay: Payer: Medicare Other | Attending: Oncology

## 2020-10-05 ENCOUNTER — Other Ambulatory Visit: Payer: Self-pay

## 2020-10-05 VITALS — BP 172/71 | HR 66 | Temp 98.7°F | Resp 20 | Ht 71.0 in | Wt 211.0 lb

## 2020-10-05 DIAGNOSIS — C61 Malignant neoplasm of prostate: Secondary | ICD-10-CM | POA: Insufficient documentation

## 2020-10-05 DIAGNOSIS — C772 Secondary and unspecified malignant neoplasm of intra-abdominal lymph nodes: Secondary | ICD-10-CM | POA: Diagnosis not present

## 2020-10-05 DIAGNOSIS — C7951 Secondary malignant neoplasm of bone: Secondary | ICD-10-CM | POA: Insufficient documentation

## 2020-10-05 DIAGNOSIS — M549 Dorsalgia, unspecified: Secondary | ICD-10-CM | POA: Diagnosis not present

## 2020-10-05 LAB — CBC AND DIFFERENTIAL
HCT: 43 (ref 41–53)
Hemoglobin: 14.4 (ref 13.5–17.5)
Neutrophils Absolute: 4.49
Platelets: 222 (ref 150–399)
WBC: 6.5

## 2020-10-05 LAB — BASIC METABOLIC PANEL
BUN: 9 (ref 4–21)
CO2: 26 — AB (ref 13–22)
Chloride: 106 (ref 99–108)
Creatinine: 1 (ref 0.6–1.3)
Glucose: 117
Potassium: 4.4 (ref 3.4–5.3)
Sodium: 137 (ref 137–147)

## 2020-10-05 LAB — COMPREHENSIVE METABOLIC PANEL: Calcium: 8.8 (ref 8.7–10.7)

## 2020-10-05 LAB — CBC: RBC: 4.76 (ref 3.87–5.11)

## 2020-10-05 MED ORDER — SODIUM CHLORIDE 0.9 % IV SOLN
Freq: Once | INTRAVENOUS | Status: AC
  Filled 2020-10-05: qty 250

## 2020-10-05 MED ORDER — ZOLEDRONIC ACID 4 MG/100ML IV SOLN
INTRAVENOUS | Status: AC
  Filled 2020-10-05: qty 100

## 2020-10-05 MED ORDER — ZOLEDRONIC ACID 4 MG/100ML IV SOLN
4.0000 mg | Freq: Once | INTRAVENOUS | Status: AC
  Administered 2020-10-05: 4 mg via INTRAVENOUS

## 2020-10-05 NOTE — Patient Instructions (Signed)
Zoledronic Acid Injection (Hypercalcemia, Oncology) What is this medicine? ZOLEDRONIC ACID (ZOE le dron ik AS id) slows calcium loss from bones. It high calcium levels in the blood from some kinds of cancer. It may be used in other people at risk for bone loss. This medicine may be used for other purposes; ask your health care provider or pharmacist if you have questions. COMMON BRAND NAME(S): Zometa What should I tell my health care provider before I take this medicine? They need to know if you have any of these conditions:  cancer  dehydration  dental disease  kidney disease  liver disease  low levels of calcium in the blood  lung or breathing disease (asthma)  receiving steroids like dexamethasone or prednisone  an unusual or allergic reaction to zoledronic acid, other medicines, foods, dyes, or preservatives  pregnant or trying to get pregnant  breast-feeding How should I use this medicine? This drug is injected into a vein. It is given by a health care provider in a hospital or clinic setting. Talk to your health care provider about the use of this drug in children. Special care may be needed. Overdosage: If you think you have taken too much of this medicine contact a poison control center or emergency room at once. NOTE: This medicine is only for you. Do not share this medicine with others. What if I miss a dose? Keep appointments for follow-up doses. It is important not to miss your dose. Call your health care provider if you are unable to keep an appointment. What may interact with this medicine?  certain antibiotics given by injection  NSAIDs, medicines for pain and inflammation, like ibuprofen or naproxen  some diuretics like bumetanide, furosemide  teriparatide  thalidomide This list may not describe all possible interactions. Give your health care provider a list of all the medicines, herbs, non-prescription drugs, or dietary supplements you use. Also tell  them if you smoke, drink alcohol, or use illegal drugs. Some items may interact with your medicine. What should I watch for while using this medicine? Visit your health care provider for regular checks on your progress. It may be some time before you see the benefit from this drug. Some people who take this drug have severe bone, joint, or muscle pain. This drug may also increase your risk for jaw problems or a broken thigh bone. Tell your health care provider right away if you have severe pain in your jaw, bones, joints, or muscles. Tell you health care provider if you have any pain that does not go away or that gets worse. Tell your dentist and dental surgeon that you are taking this drug. You should not have major dental surgery while on this drug. See your dentist to have a dental exam and fix any dental problems before starting this drug. Take good care of your teeth while on this drug. Make sure you see your dentist for regular follow-up appointments. You should make sure you get enough calcium and vitamin D while you are taking this drug. Discuss the foods you eat and the vitamins you take with your health care provider. Check with your health care provider if you have severe diarrhea, nausea, and vomiting, or if you sweat a lot. The loss of too much body fluid may make it dangerous for you to take this drug. You may need blood work done while you are taking this drug. Do not become pregnant while taking this drug. Women should inform their health care provider   if they wish to become pregnant or think they might be pregnant. There is potential for serious harm to an unborn child. Talk to your health care provider for more information. What side effects may I notice from receiving this medicine? Side effects that you should report to your doctor or health care provider as soon as possible:  allergic reactions (skin rash, itching or hives; swelling of the face, lips, or tongue)  bone  pain  infection (fever, chills, cough, sore throat, pain or trouble passing urine)  jaw pain, especially after dental work  joint pain  kidney injury (trouble passing urine or change in the amount of urine)  low blood pressure (dizziness; feeling faint or lightheaded, falls; unusually weak or tired)  low calcium levels (fast heartbeat; muscle cramps or pain; pain, tingling, or numbness in the hands or feet; seizures)  low magnesium levels (fast, irregular heartbeat; muscle cramp or pain; muscle weakness; tremors; seizures)  low red blood cell counts (trouble breathing; feeling faint; lightheaded, falls; unusually weak or tired)  muscle pain  redness, blistering, peeling, or loosening of the skin, including inside the mouth  severe diarrhea  swelling of the ankles, feet, hands  trouble breathing Side effects that usually do not require medical attention (report to your doctor or health care provider if they continue or are bothersome):  anxious  constipation  coughing  depressed mood  eye irritation, itching, or pain  fever  general ill feeling or flu-like symptoms  nausea  pain, redness, or irritation at site where injected  trouble sleeping This list may not describe all possible side effects. Call your doctor for medical advice about side effects. You may report side effects to FDA at 1-800-FDA-1088. Where should I keep my medicine? This drug is given in a hospital or clinic. It will not be stored at home. NOTE: This sheet is a summary. It may not cover all possible information. If you have questions about this medicine, talk to your doctor, pharmacist, or health care provider.  2021 Elsevier/Gold Standard (2019-05-07 09:13:00)  

## 2020-10-05 NOTE — Addendum Note (Signed)
Addended by: Juanetta Beets on: 10/05/2020 10:10 AM   Modules accepted: Orders

## 2020-10-06 ENCOUNTER — Other Ambulatory Visit: Payer: Self-pay | Admitting: Hematology and Oncology

## 2020-10-06 DIAGNOSIS — Z510 Encounter for antineoplastic radiation therapy: Secondary | ICD-10-CM | POA: Diagnosis not present

## 2020-10-06 DIAGNOSIS — C7951 Secondary malignant neoplasm of bone: Secondary | ICD-10-CM | POA: Diagnosis not present

## 2020-10-06 LAB — PROSTATE-SPECIFIC AG, SERUM (LABCORP): Prostate Specific Ag, Serum: 0.2 ng/mL (ref 0.0–4.0)

## 2020-10-06 MED ORDER — MORPHINE SULFATE 15 MG PO TABS: 15.0000 mg | ORAL_TABLET | ORAL | 0 refills | Status: DC | PRN

## 2020-10-06 MED ORDER — MORPHINE SULFATE ER 30 MG PO TBCR: 30.0000 mg | EXTENDED_RELEASE_TABLET | Freq: Two times a day (BID) | ORAL | 0 refills | Status: DC

## 2020-10-07 ENCOUNTER — Encounter: Payer: Self-pay | Admitting: Oncology

## 2020-10-07 DIAGNOSIS — Z0001 Encounter for general adult medical examination with abnormal findings: Secondary | ICD-10-CM | POA: Diagnosis not present

## 2020-10-07 DIAGNOSIS — C7951 Secondary malignant neoplasm of bone: Secondary | ICD-10-CM | POA: Diagnosis not present

## 2020-10-07 DIAGNOSIS — Z510 Encounter for antineoplastic radiation therapy: Secondary | ICD-10-CM | POA: Diagnosis not present

## 2020-10-11 ENCOUNTER — Other Ambulatory Visit: Payer: Self-pay | Admitting: Hematology and Oncology

## 2020-10-11 DIAGNOSIS — C7951 Secondary malignant neoplasm of bone: Secondary | ICD-10-CM | POA: Diagnosis not present

## 2020-10-11 DIAGNOSIS — Z510 Encounter for antineoplastic radiation therapy: Secondary | ICD-10-CM | POA: Diagnosis not present

## 2020-10-11 MED ORDER — ONDANSETRON 4 MG PO TBDP: 4.0000 mg | ORAL_TABLET | Freq: Three times a day (TID) | ORAL | 0 refills | Status: DC | PRN

## 2020-10-11 MED ORDER — FENTANYL 50 MCG/HR TD PT72: 1.0000 | MEDICATED_PATCH | TRANSDERMAL | 0 refills | Status: DC

## 2020-10-12 DIAGNOSIS — C7951 Secondary malignant neoplasm of bone: Secondary | ICD-10-CM | POA: Diagnosis not present

## 2020-10-12 DIAGNOSIS — Z79899 Other long term (current) drug therapy: Secondary | ICD-10-CM | POA: Diagnosis not present

## 2020-10-12 DIAGNOSIS — I10 Essential (primary) hypertension: Secondary | ICD-10-CM | POA: Diagnosis not present

## 2020-10-12 DIAGNOSIS — Z510 Encounter for antineoplastic radiation therapy: Secondary | ICD-10-CM | POA: Diagnosis not present

## 2020-10-12 DIAGNOSIS — Z0000 Encounter for general adult medical examination without abnormal findings: Secondary | ICD-10-CM | POA: Diagnosis not present

## 2020-10-13 ENCOUNTER — Other Ambulatory Visit: Payer: Self-pay | Admitting: Hematology and Oncology

## 2020-10-13 ENCOUNTER — Telehealth: Payer: Self-pay

## 2020-10-13 DIAGNOSIS — C7951 Secondary malignant neoplasm of bone: Secondary | ICD-10-CM | POA: Diagnosis not present

## 2020-10-13 DIAGNOSIS — Z510 Encounter for antineoplastic radiation therapy: Secondary | ICD-10-CM | POA: Diagnosis not present

## 2020-10-13 NOTE — Telephone Encounter (Signed)
I spoke with pt. He confirms he is only using fentanyl 23mcg patch. He has MSIR @ home, but isn't using them @ this time. I will update Melissa P,NP, & med list.

## 2020-10-14 DIAGNOSIS — Z510 Encounter for antineoplastic radiation therapy: Secondary | ICD-10-CM | POA: Diagnosis not present

## 2020-10-14 DIAGNOSIS — C7951 Secondary malignant neoplasm of bone: Secondary | ICD-10-CM | POA: Diagnosis not present

## 2020-10-17 DIAGNOSIS — Z510 Encounter for antineoplastic radiation therapy: Secondary | ICD-10-CM | POA: Diagnosis not present

## 2020-10-17 DIAGNOSIS — C7951 Secondary malignant neoplasm of bone: Secondary | ICD-10-CM | POA: Diagnosis not present

## 2020-10-18 DIAGNOSIS — C7951 Secondary malignant neoplasm of bone: Secondary | ICD-10-CM | POA: Diagnosis not present

## 2020-10-19 ENCOUNTER — Other Ambulatory Visit: Payer: Self-pay | Admitting: Hematology and Oncology

## 2020-10-19 DIAGNOSIS — C7951 Secondary malignant neoplasm of bone: Secondary | ICD-10-CM | POA: Diagnosis not present

## 2020-10-19 MED ORDER — LORAZEPAM 0.5 MG PO TABS: 0.5000 mg | ORAL_TABLET | Freq: Three times a day (TID) | ORAL | 0 refills | Status: DC

## 2020-10-20 DIAGNOSIS — C7951 Secondary malignant neoplasm of bone: Secondary | ICD-10-CM | POA: Diagnosis not present

## 2020-10-21 DIAGNOSIS — C7951 Secondary malignant neoplasm of bone: Secondary | ICD-10-CM | POA: Diagnosis not present

## 2020-10-24 DIAGNOSIS — C7951 Secondary malignant neoplasm of bone: Secondary | ICD-10-CM | POA: Diagnosis not present

## 2020-10-25 DIAGNOSIS — C7951 Secondary malignant neoplasm of bone: Secondary | ICD-10-CM | POA: Diagnosis not present

## 2020-10-25 DIAGNOSIS — Z510 Encounter for antineoplastic radiation therapy: Secondary | ICD-10-CM | POA: Diagnosis not present

## 2020-10-25 NOTE — Addendum Note (Signed)
Addended by: Juanetta Beets on: 10/25/2020 04:34 PM   Modules accepted: Orders

## 2020-10-26 DIAGNOSIS — Z510 Encounter for antineoplastic radiation therapy: Secondary | ICD-10-CM | POA: Diagnosis not present

## 2020-10-26 DIAGNOSIS — C7951 Secondary malignant neoplasm of bone: Secondary | ICD-10-CM | POA: Diagnosis not present

## 2020-10-27 DIAGNOSIS — C7951 Secondary malignant neoplasm of bone: Secondary | ICD-10-CM | POA: Diagnosis not present

## 2020-10-27 DIAGNOSIS — Z510 Encounter for antineoplastic radiation therapy: Secondary | ICD-10-CM | POA: Diagnosis not present

## 2020-10-28 DIAGNOSIS — Z510 Encounter for antineoplastic radiation therapy: Secondary | ICD-10-CM | POA: Diagnosis not present

## 2020-10-28 DIAGNOSIS — C7951 Secondary malignant neoplasm of bone: Secondary | ICD-10-CM | POA: Diagnosis not present

## 2020-11-02 ENCOUNTER — Inpatient Hospital Stay: Payer: Medicare Other

## 2020-11-02 ENCOUNTER — Other Ambulatory Visit: Payer: Self-pay

## 2020-11-02 ENCOUNTER — Other Ambulatory Visit: Payer: Self-pay | Admitting: Hematology and Oncology

## 2020-11-02 VITALS — BP 182/88 | HR 80 | Temp 98.2°F | Resp 18 | Wt 209.4 lb

## 2020-11-02 DIAGNOSIS — C7951 Secondary malignant neoplasm of bone: Secondary | ICD-10-CM

## 2020-11-02 DIAGNOSIS — M549 Dorsalgia, unspecified: Secondary | ICD-10-CM | POA: Diagnosis not present

## 2020-11-02 LAB — BASIC METABOLIC PANEL
BUN: 11 (ref 4–21)
CO2: 26 — AB (ref 13–22)
Chloride: 105 (ref 99–108)
Creatinine: 1.2 (ref 0.6–1.3)
Glucose: 115
Potassium: 4.1 (ref 3.4–5.3)
Sodium: 138 (ref 137–147)

## 2020-11-02 LAB — COMPREHENSIVE METABOLIC PANEL: Calcium: 9.1 (ref 8.7–10.7)

## 2020-11-02 MED ORDER — ZOLEDRONIC ACID 4 MG/100ML IV SOLN
4.0000 mg | Freq: Once | INTRAVENOUS | Status: AC
  Administered 2020-11-02: 4 mg via INTRAVENOUS

## 2020-11-02 MED ORDER — HYDROMORPHONE HCL 1 MG/ML IJ SOLN
INTRAMUSCULAR | Status: AC
  Filled 2020-11-02: qty 1

## 2020-11-02 MED ORDER — ZOLEDRONIC ACID 4 MG/100ML IV SOLN
INTRAVENOUS | Status: AC
  Filled 2020-11-02: qty 100

## 2020-11-02 MED ORDER — HYDROMORPHONE HCL 1 MG/ML IJ SOLN
1.0000 mg | Freq: Once | INTRAMUSCULAR | Status: AC
  Administered 2020-11-02: 1 mg via INTRAVENOUS

## 2020-11-02 MED ORDER — SODIUM CHLORIDE 0.9 % IV SOLN
Freq: Once | INTRAVENOUS | Status: AC
  Filled 2020-11-02: qty 250

## 2020-11-02 NOTE — Progress Notes (Signed)
PT CAME TO CLINIC TODAY FOR ZOMETA INFUSION, AND STATES PAIN 9 IN HIS BACK FOR SEVERAL DAYS.  HE HAS STOPPED USING HIS FENTANYL PATCH 2-3 DAYS AGO.  STATES HE HAS DAILY NAUSEA AND HE THOUGHT STOPPING THE PAIN MED WOULD HELP, IT HAS NOT.  MELISSA, NP NOTIFIED AND GAVE DILAUDID 1 MG IV.  PT AGREES THAT HE WILLL GO HOME AND PUT A FENTANYL PATCH ON IMMEDIATELY.  USE HOME NAUSEA MEDS ALSO.  MELISSA  AWARE.

## 2020-11-02 NOTE — Patient Instructions (Signed)
Zoledronic Acid Injection (Hypercalcemia, Oncology) What is this medicine? ZOLEDRONIC ACID (ZOE le dron ik AS id) slows calcium loss from bones. It high calcium levels in the blood from some kinds of cancer. It may be used in other people at risk for bone loss. This medicine may be used for other purposes; ask your health care provider or pharmacist if you have questions. COMMON BRAND NAME(S): Zometa What should I tell my health care provider before I take this medicine? They need to know if you have any of these conditions:  cancer  dehydration  dental disease  kidney disease  liver disease  low levels of calcium in the blood  lung or breathing disease (asthma)  receiving steroids like dexamethasone or prednisone  an unusual or allergic reaction to zoledronic acid, other medicines, foods, dyes, or preservatives  pregnant or trying to get pregnant  breast-feeding How should I use this medicine? This drug is injected into a vein. It is given by a health care provider in a hospital or clinic setting. Talk to your health care provider about the use of this drug in children. Special care may be needed. Overdosage: If you think you have taken too much of this medicine contact a poison control center or emergency room at once. NOTE: This medicine is only for you. Do not share this medicine with others. What if I miss a dose? Keep appointments for follow-up doses. It is important not to miss your dose. Call your health care provider if you are unable to keep an appointment. What may interact with this medicine?  certain antibiotics given by injection  NSAIDs, medicines for pain and inflammation, like ibuprofen or naproxen  some diuretics like bumetanide, furosemide  teriparatide  thalidomide This list may not describe all possible interactions. Give your health care provider a list of all the medicines, herbs, non-prescription drugs, or dietary supplements you use. Also tell  them if you smoke, drink alcohol, or use illegal drugs. Some items may interact with your medicine. What should I watch for while using this medicine? Visit your health care provider for regular checks on your progress. It may be some time before you see the benefit from this drug. Some people who take this drug have severe bone, joint, or muscle pain. This drug may also increase your risk for jaw problems or a broken thigh bone. Tell your health care provider right away if you have severe pain in your jaw, bones, joints, or muscles. Tell you health care provider if you have any pain that does not go away or that gets worse. Tell your dentist and dental surgeon that you are taking this drug. You should not have major dental surgery while on this drug. See your dentist to have a dental exam and fix any dental problems before starting this drug. Take good care of your teeth while on this drug. Make sure you see your dentist for regular follow-up appointments. You should make sure you get enough calcium and vitamin D while you are taking this drug. Discuss the foods you eat and the vitamins you take with your health care provider. Check with your health care provider if you have severe diarrhea, nausea, and vomiting, or if you sweat a lot. The loss of too much body fluid may make it dangerous for you to take this drug. You may need blood work done while you are taking this drug. Do not become pregnant while taking this drug. Women should inform their health care provider   if they wish to become pregnant or think they might be pregnant. There is potential for serious harm to an unborn child. Talk to your health care provider for more information. What side effects may I notice from receiving this medicine? Side effects that you should report to your doctor or health care provider as soon as possible:  allergic reactions (skin rash, itching or hives; swelling of the face, lips, or tongue)  bone  pain  infection (fever, chills, cough, sore throat, pain or trouble passing urine)  jaw pain, especially after dental work  joint pain  kidney injury (trouble passing urine or change in the amount of urine)  low blood pressure (dizziness; feeling faint or lightheaded, falls; unusually weak or tired)  low calcium levels (fast heartbeat; muscle cramps or pain; pain, tingling, or numbness in the hands or feet; seizures)  low magnesium levels (fast, irregular heartbeat; muscle cramp or pain; muscle weakness; tremors; seizures)  low red blood cell counts (trouble breathing; feeling faint; lightheaded, falls; unusually weak or tired)  muscle pain  redness, blistering, peeling, or loosening of the skin, including inside the mouth  severe diarrhea  swelling of the ankles, feet, hands  trouble breathing Side effects that usually do not require medical attention (report to your doctor or health care provider if they continue or are bothersome):  anxious  constipation  coughing  depressed mood  eye irritation, itching, or pain  fever  general ill feeling or flu-like symptoms  nausea  pain, redness, or irritation at site where injected  trouble sleeping This list may not describe all possible side effects. Call your doctor for medical advice about side effects. You may report side effects to FDA at 1-800-FDA-1088. Where should I keep my medicine? This drug is given in a hospital or clinic. It will not be stored at home. NOTE: This sheet is a summary. It may not cover all possible information. If you have questions about this medicine, talk to your doctor, pharmacist, or health care provider.  2021 Elsevier/Gold Standard (2019-05-07 09:13:00)  

## 2020-11-14 ENCOUNTER — Other Ambulatory Visit: Payer: Self-pay | Admitting: Hematology and Oncology

## 2020-11-14 DIAGNOSIS — C7951 Secondary malignant neoplasm of bone: Secondary | ICD-10-CM

## 2020-11-14 DIAGNOSIS — M549 Dorsalgia, unspecified: Secondary | ICD-10-CM

## 2020-11-29 ENCOUNTER — Other Ambulatory Visit: Payer: Self-pay

## 2020-11-29 ENCOUNTER — Inpatient Hospital Stay: Payer: Medicare Other | Attending: Oncology

## 2020-11-29 ENCOUNTER — Other Ambulatory Visit: Payer: Self-pay | Admitting: Hematology and Oncology

## 2020-11-29 DIAGNOSIS — C61 Malignant neoplasm of prostate: Secondary | ICD-10-CM | POA: Insufficient documentation

## 2020-11-29 DIAGNOSIS — C7951 Secondary malignant neoplasm of bone: Secondary | ICD-10-CM | POA: Insufficient documentation

## 2020-11-29 DIAGNOSIS — Z5111 Encounter for antineoplastic chemotherapy: Secondary | ICD-10-CM | POA: Insufficient documentation

## 2020-11-29 DIAGNOSIS — D649 Anemia, unspecified: Secondary | ICD-10-CM | POA: Diagnosis not present

## 2020-11-29 DIAGNOSIS — Z79899 Other long term (current) drug therapy: Secondary | ICD-10-CM | POA: Insufficient documentation

## 2020-11-29 LAB — HEPATIC FUNCTION PANEL
ALT: 21 (ref 10–40)
AST: 24 (ref 14–40)
Alkaline Phosphatase: 82 (ref 25–125)
Bilirubin, Total: 0.6

## 2020-11-29 LAB — CBC AND DIFFERENTIAL
HCT: 43 (ref 41–53)
Hemoglobin: 14.4 (ref 13.5–17.5)
Neutrophils Absolute: 5.25
Platelets: 214 (ref 150–399)
WBC: 7.4

## 2020-11-29 LAB — BASIC METABOLIC PANEL
BUN: 13 (ref 4–21)
CO2: 22 (ref 13–22)
Chloride: 106 (ref 99–108)
Creatinine: 1.1 (ref 0.6–1.3)
Glucose: 104
Potassium: 3.9 (ref 3.4–5.3)
Sodium: 137 (ref 137–147)

## 2020-11-29 LAB — COMPREHENSIVE METABOLIC PANEL
Albumin: 4.3 (ref 3.5–5.0)
Calcium: 8.8 (ref 8.7–10.7)

## 2020-11-29 LAB — CBC
MCV: 90 (ref 81–94)
RBC: 4.76 (ref 3.87–5.11)

## 2020-11-30 ENCOUNTER — Inpatient Hospital Stay (INDEPENDENT_AMBULATORY_CARE_PROVIDER_SITE_OTHER): Payer: Medicare Other | Admitting: Oncology

## 2020-11-30 ENCOUNTER — Inpatient Hospital Stay: Payer: Medicare Other

## 2020-11-30 ENCOUNTER — Other Ambulatory Visit: Payer: Self-pay | Admitting: Oncology

## 2020-11-30 ENCOUNTER — Telehealth: Payer: Self-pay | Admitting: Oncology

## 2020-11-30 VITALS — BP 162/80 | HR 79 | Temp 97.9°F | Resp 20 | Ht 71.0 in | Wt 208.6 lb

## 2020-11-30 DIAGNOSIS — C61 Malignant neoplasm of prostate: Secondary | ICD-10-CM

## 2020-11-30 DIAGNOSIS — C7951 Secondary malignant neoplasm of bone: Secondary | ICD-10-CM | POA: Diagnosis not present

## 2020-11-30 DIAGNOSIS — Z5111 Encounter for antineoplastic chemotherapy: Secondary | ICD-10-CM | POA: Diagnosis not present

## 2020-11-30 DIAGNOSIS — Z79899 Other long term (current) drug therapy: Secondary | ICD-10-CM | POA: Diagnosis not present

## 2020-11-30 MED ORDER — SODIUM CHLORIDE 0.9 % IV SOLN
Freq: Once | INTRAVENOUS | Status: AC
  Filled 2020-11-30: qty 250

## 2020-11-30 MED ORDER — ZOLEDRONIC ACID 4 MG/100ML IV SOLN
INTRAVENOUS | Status: AC
  Filled 2020-11-30: qty 100

## 2020-11-30 MED ORDER — LEUPROLIDE ACETATE (3 MONTH) 22.5 MG IM KIT
PACK | INTRAMUSCULAR | Status: AC
  Filled 2020-11-30: qty 22.5

## 2020-11-30 MED ORDER — ZOLEDRONIC ACID 4 MG/100ML IV SOLN
4.0000 mg | Freq: Once | INTRAVENOUS | Status: AC
  Administered 2020-11-30: 4 mg via INTRAVENOUS

## 2020-11-30 MED ORDER — FENTANYL 75 MCG/HR TD PT72: 1.0000 | MEDICATED_PATCH | TRANSDERMAL | 0 refills | Status: AC

## 2020-11-30 MED ORDER — LEUPROLIDE ACETATE (3 MONTH) 22.5 MG IM KIT
22.5000 mg | PACK | Freq: Once | INTRAMUSCULAR | Status: AC
  Administered 2020-11-30: 22.5 mg via INTRAMUSCULAR

## 2020-11-30 NOTE — Patient Instructions (Signed)
Zoledronic Acid Injection (Hypercalcemia, Oncology) What is this medicine? ZOLEDRONIC ACID (ZOE le dron ik AS id) slows calcium loss from bones. It high calcium levels in the blood from some kinds of cancer. It may be used in other people at risk for bone loss. This medicine may be used for other purposes; ask your health care provider or pharmacist if you have questions. COMMON BRAND NAME(S): Zometa What should I tell my health care provider before I take this medicine? They need to know if you have any of these conditions:  cancer  dehydration  dental disease  kidney disease  liver disease  low levels of calcium in the blood  lung or breathing disease (asthma)  receiving steroids like dexamethasone or prednisone  an unusual or allergic reaction to zoledronic acid, other medicines, foods, dyes, or preservatives  pregnant or trying to get pregnant  breast-feeding How should I use this medicine? This drug is injected into a vein. It is given by a health care provider in a hospital or clinic setting. Talk to your health care provider about the use of this drug in children. Special care may be needed. Overdosage: If you think you have taken too much of this medicine contact a poison control center or emergency room at once. NOTE: This medicine is only for you. Do not share this medicine with others. What if I miss a dose? Keep appointments for follow-up doses. It is important not to miss your dose. Call your health care provider if you are unable to keep an appointment. What may interact with this medicine?  certain antibiotics given by injection  NSAIDs, medicines for pain and inflammation, like ibuprofen or naproxen  some diuretics like bumetanide, furosemide  teriparatide  thalidomide This list may not describe all possible interactions. Give your health care provider a list of all the medicines, herbs, non-prescription drugs, or dietary supplements you use. Also tell  them if you smoke, drink alcohol, or use illegal drugs. Some items may interact with your medicine. What should I watch for while using this medicine? Visit your health care provider for regular checks on your progress. It may be some time before you see the benefit from this drug. Some people who take this drug have severe bone, joint, or muscle pain. This drug may also increase your risk for jaw problems or a broken thigh bone. Tell your health care provider right away if you have severe pain in your jaw, bones, joints, or muscles. Tell you health care provider if you have any pain that does not go away or that gets worse. Tell your dentist and dental surgeon that you are taking this drug. You should not have major dental surgery while on this drug. See your dentist to have a dental exam and fix any dental problems before starting this drug. Take good care of your teeth while on this drug. Make sure you see your dentist for regular follow-up appointments. You should make sure you get enough calcium and vitamin D while you are taking this drug. Discuss the foods you eat and the vitamins you take with your health care provider. Check with your health care provider if you have severe diarrhea, nausea, and vomiting, or if you sweat a lot. The loss of too much body fluid may make it dangerous for you to take this drug. You may need blood work done while you are taking this drug. Do not become pregnant while taking this drug. Women should inform their health care provider   if they wish to become pregnant or think they might be pregnant. There is potential for serious harm to an unborn child. Talk to your health care provider for more information. What side effects may I notice from receiving this medicine? Side effects that you should report to your doctor or health care provider as soon as possible:  allergic reactions (skin rash, itching or hives; swelling of the face, lips, or tongue)  bone  pain  infection (fever, chills, cough, sore throat, pain or trouble passing urine)  jaw pain, especially after dental work  joint pain  kidney injury (trouble passing urine or change in the amount of urine)  low blood pressure (dizziness; feeling faint or lightheaded, falls; unusually weak or tired)  low calcium levels (fast heartbeat; muscle cramps or pain; pain, tingling, or numbness in the hands or feet; seizures)  low magnesium levels (fast, irregular heartbeat; muscle cramp or pain; muscle weakness; tremors; seizures)  low red blood cell counts (trouble breathing; feeling faint; lightheaded, falls; unusually weak or tired)  muscle pain  redness, blistering, peeling, or loosening of the skin, including inside the mouth  severe diarrhea  swelling of the ankles, feet, hands  trouble breathing Side effects that usually do not require medical attention (report to your doctor or health care provider if they continue or are bothersome):  anxious  constipation  coughing  depressed mood  eye irritation, itching, or pain  fever  general ill feeling or flu-like symptoms  nausea  pain, redness, or irritation at site where injected  trouble sleeping This list may not describe all possible side effects. Call your doctor for medical advice about side effects. You may report side effects to FDA at 1-800-FDA-1088. Where should I keep my medicine? This drug is given in a hospital or clinic. It will not be stored at home. NOTE: This sheet is a summary. It may not cover all possible information. If you have questions about this medicine, talk to your doctor, pharmacist, or health care provider.  2021 Elsevier/Gold Standard (2019-05-07 09:13:00) Leuprolide injection What is this medicine? LEUPROLIDE (loo PROE lide) is a man-made hormone. It is used to treat the symptoms of prostate cancer. This medicine may also be used to treat children with early onset of puberty. It may be  used for other hormonal conditions. This medicine may be used for other purposes; ask your health care provider or pharmacist if you have questions. COMMON BRAND NAME(S): Lupron What should I tell my health care provider before I take this medicine? They need to know if you have any of these conditions:  diabetes  heart disease or previous heart attack  high blood pressure  high cholesterol  pain or difficulty passing urine  spinal cord metastasis  stroke  tobacco smoker  an unusual or allergic reaction to leuprolide, benzyl alcohol, other medicines, foods, dyes, or preservatives  pregnant or trying to get pregnant  breast-feeding How should I use this medicine? This medicine is for injection under the skin or into a muscle. You will be taught how to prepare and give this medicine. Use exactly as directed. Take your medicine at regular intervals. Do not take your medicine more often than directed. It is important that you put your used needles and syringes in a special sharps container. Do not put them in a trash can. If you do not have a sharps container, call your pharmacist or healthcare provider to get one. A special MedGuide will be given to you by the   pharmacist with each prescription and refill. Be sure to read this information carefully each time. Talk to your pediatrician regarding the use of this medicine in children. While this medicine may be prescribed for children as young as 8 years for selected conditions, precautions do apply. Overdosage: If you think you have taken too much of this medicine contact a poison control center or emergency room at once. NOTE: This medicine is only for you. Do not share this medicine with others. What if I miss a dose? If you miss a dose, take it as soon as you can. If it is almost time for your next dose, take only that dose. Do not take double or extra doses. What may interact with this medicine? Do not take this medicine with any  of the following medications:  chasteberry  cisapride  dronedarone  pimozide  thioridazine This medicine may also interact with the following medications:  herbal or dietary supplements, like black cohosh or DHEA  male hormones, like estrogens or progestins and birth control pills, patches, rings, or injections  male hormones, like testosterone  other medicines that prolong the QT interval (abnormal heart rhythm) This list may not describe all possible interactions. Give your health care provider a list of all the medicines, herbs, non-prescription drugs, or dietary supplements you use. Also tell them if you smoke, drink alcohol, or use illegal drugs. Some items may interact with your medicine. What should I watch for while using this medicine? Visit your doctor or health care professional for regular checks on your progress. During the first week, your symptoms may get worse, but then will improve as you continue your treatment. You may get hot flashes, increased bone pain, increased difficulty passing urine, or an aggravation of nerve symptoms. Discuss these effects with your doctor or health care professional, some of them may improve with continued use of this medicine. Male patients may experience a menstrual cycle or spotting during the first 2 months of therapy with this medicine. If this continues, contact your doctor or health care professional. This medicine may increase blood sugar. Ask your healthcare provider if changes in diet or medicines are needed if you have diabetes. What side effects may I notice from receiving this medicine? Side effects that you should report to your doctor or health care professional as soon as possible:  allergic reactions like skin rash, itching or hives, swelling of the face, lips, or tongue  breathing problems  chest pain  depression or memory disorders  pain in your legs or groin  pain at site where injected  severe  headache  signs and symptoms of high blood sugar such as being more thirsty or hungry or having to urinate more than normal. You may also feel very tired or have blurry vision  swelling of the feet and legs  visual changes  vomiting Side effects that usually do not require medical attention (report to your doctor or health care professional if they continue or are bothersome):  breast swelling or tenderness  decrease in sex drive or performance  diarrhea  hot flashes  loss of appetite  muscle, joint, or bone pains  nausea  redness or irritation at site where injected  skin problems or acne This list may not describe all possible side effects. Call your doctor for medical advice about side effects. You may report side effects to FDA at 1-800-FDA-1088. Where should I keep my medicine? Keep out of the reach of children. Store below 25 degrees C (77   degrees F). Do not freeze. Protect from light. Do not use if it is not clear or if there are particles present. Throw away any unused medicine after the expiration date. NOTE: This sheet is a summary. It may not cover all possible information. If you have questions about this medicine, talk to your doctor, pharmacist, or health care provider.  2021 Elsevier/Gold Standard (2019-06-24 10:57:41)  

## 2020-11-30 NOTE — Telephone Encounter (Signed)
Per 4/27 los next appt scheduled and confirmed with patient

## 2020-11-30 NOTE — Progress Notes (Signed)
[facility]  [address] [place],  [place]  [address] [phone]  Clinic Day:  11/30/2020  Referring physician: [name], MD   HISTORY OF PRESENT ILLNESS:  The patient is a 70 y.o. male with metastatic prostate cancer, which includes spread of disease to his bones and retroperitoneal lymph nodes.  He is currently on Lupron/enzalutamide to keep his disease under control.  He also previously underwent palliative radiation to his L3 vertebral body to treat the pain associated with his metastatic bone disease.  As there was persistent lower back pain, he underwent another course of palliative radiation.  Despite this and switching him to Fentanyl, he continues to complain of lower back pain.  Of note, over the past few months, his PSA has been rising, but at very minor levels.  PHYSICAL EXAM:  Blood pressure (!) 162/80, pulse 79, temperature 97.9 F (36.6 C), resp. rate 20, height 5\' 11"  (1.803 m), weight 208 lb 9.6 oz (94.6 kg), SpO2 97 %. Wt Readings from Last 3 Encounters:  11/30/20 208 lb 9.6 oz (94.6 kg)  11/02/20 209 lb 6 oz (95 kg)  10/11/20 213 lb 1 oz (96.6 kg)   Body mass index is 29.09 kg/m. Performance status (ECOG): 1 Physical Exam Constitutional:      Appearance: Normal appearance. He is not ill-appearing.  HENT:     Mouth/Throat:     Mouth: Mucous membranes are moist.     Pharynx: Oropharynx is clear. No oropharyngeal exudate or posterior oropharyngeal erythema.  Cardiovascular:     Rate and Rhythm: Normal rate and regular rhythm.     Heart sounds: No murmur heard. No friction rub. No gallop.   Pulmonary:     Effort: Pulmonary effort is normal. No respiratory distress.     Breath sounds: Normal breath sounds. No wheezing, rhonchi or rales.  Chest:  Breasts:     Right: No axillary adenopathy or supraclavicular adenopathy.     Left: No axillary adenopathy or supraclavicular adenopathy.    Abdominal:     General: Bowel  sounds are normal. There is no distension.     Palpations: Abdomen is soft. There is no mass.     Tenderness: There is no abdominal tenderness.  Musculoskeletal:        General: No swelling.     Right lower leg: No edema.     Left lower leg: No edema.  Lymphadenopathy:     Cervical: No cervical adenopathy.     Upper Body:     Right upper body: No supraclavicular or axillary adenopathy.     Left upper body: No supraclavicular or axillary adenopathy.     Lower Body: No right inguinal adenopathy. No left inguinal adenopathy.  Skin:    General: Skin is warm.     Coloration: Skin is not jaundiced.     Findings: No lesion or rash.  Neurological:     General: No focal deficit present.     Mental Status: He is alert and oriented to person, place, and time. Mental status is at baseline.     Cranial Nerves: Cranial nerves are intact.  Psychiatric:        Mood and Affect: Mood normal.        Behavior: Behavior normal.        Thought Content: Thought content normal.    LABS:    Ref. Range 11/30/2020 09:42  Prostate Specific Ag, Serum Latest Ref Range: 0.0 - 4.0 ng/mL 0.2  ASSESSMENT & PLAN:  Assessment/Plan:  A 70 y.o. male with metastatic prostate cancer.  Although detectable, his PSA remains very low and stable.  I do not believe a change in therapy is warranted at this time.  He will continue to take Lupron/enzalutamide for now.  He will also remain on Zometa.  With respect to his back pain, his Fentanyl patches will be increased to 75 mcg, which he will change every 3 days.  He knows he can take MSIR 15-30 mg every 4-6 hours for breakthrough pain.  I also told him to take ibuprofen 600 mg every time he uses MSIR to help with any inflammatory aspect of pain in her lower back.  I will see him back in 1 month for repeat clinical assessment.  The patient understands all the plans discussed today and is in agreement with them.    Windel Keziah Macarthur Critchley, MD

## 2020-12-01 LAB — PROSTATE-SPECIFIC AG, SERUM (LABCORP): Prostate Specific Ag, Serum: 0.2 ng/mL (ref 0.0–4.0)

## 2020-12-14 ENCOUNTER — Telehealth: Payer: Self-pay

## 2020-12-14 ENCOUNTER — Other Ambulatory Visit: Payer: Self-pay | Admitting: Hematology and Oncology

## 2020-12-14 MED ORDER — FENTANYL 50 MCG/HR TD PT72: 1.0000 | MEDICATED_PATCH | TRANSDERMAL | 0 refills | Status: DC

## 2020-12-14 MED ORDER — ZOLPIDEM TARTRATE 10 MG PO TABS: 10.0000 mg | ORAL_TABLET | Freq: Every evening | ORAL | 0 refills | Status: AC | PRN

## 2020-12-14 NOTE — Telephone Encounter (Addendum)
I spoke with Katharine Look, pt's wife. I notified her of below. She verbalized understanding.  ----- Message from Melodye Ped, NP sent at 12/14/2020  1:20 PM EDT ----- Regarding: RE: Insomnia, poor appetite, fentanyl 52mcg patches Contact: [phone] I sent the 50's in. Make sure he doesn't wear both patches. I also sent in ambien 10 mg  ----- Message ----- From: Dairl Ponder, RN Sent: 12/14/2020  12:04 PM EDT To: Melodye Ped, NP Subject: Insomnia, poor appetite, fentanyl 25mcg patc#  I spoke with pt, he reports he is staying up all night. He just cant sleep. Ambien 5mg  po Q HS- isn't working. Denies drinking caffienated drinks. He only has intermittent napping in the evenings. His appetite remains poor. He is usually drinking a cup of coffee in the morning and eating 1 meal.  I encouraged him to start using some type of nutritional supplements to ensure he is getting enough calories & nutrient needs. Pt and wife verbalized understanding.  Pt would also like a lower dose of the fentanyl 53mcg patch , as it makes him sick. Dr Bobby Rumpf started him out on Fentanyly 71mcg q 72hrs..    He uses [facility]

## 2020-12-14 NOTE — Telephone Encounter (Signed)
I spoke with pt, he reports he is staying up all night. He just cant sleep. Ambien 5mg  po Q HS- isn't working. Denies drinking caffienated drinks. He only has intermittent napping in the evenings. His appetite remains poor. He is usually drinking a cup of coffee in the morning and eating 1 meal.  I encouraged him to start using some type of nutritional supplements to ensure he is getting enough calories & nutrient needs. Pt and wife verbalized understanding.  Pt would also like a lower dose of the fentanyl 57mcg patch , as it makes him sick. Dr Bobby Rumpf started him out on Fentanyl 20mcg q 72hrs.

## 2020-12-27 NOTE — Progress Notes (Signed)
[facility]  [address] [place],  [place]  [address] [phone]  Clinic Day:  12/30/2020  Referring physician: [name], MD  This document serves as a record of services personally performed by Dequincy Macarthur Critchley, MD. It was created on their behalf by [facility] E, a trained medical scribe. The creation of this record is based on the scribe's personal observations and the provider's statements to them.  HISTORY OF PRESENT ILLNESS:  The patient is a 70 y.o. male with metastatic prostate cancer, which includes spread of disease to his bones and retroperitoneal lymph nodes.  He is currently on Lupron/enzalutamide to keep his disease under control.  He also previously underwent palliative radiation to his L3 vertebral body to treat the pain associated with his metastatic bone disease.  As there was persistent lower back pain, he underwent another course of palliative radiation.  Despite this and switching him to Fentanyl, he continues to complain of lower back pain.  Of note, over the past few months, his PSA has been rising, but at very minor levels.  PHYSICAL EXAM:  Blood pressure (!) 181/79, pulse 87, temperature 98 F (36.7 C), resp. rate 18, height 5\' 11"  (1.803 m), weight 207 lb 1.6 oz (93.9 kg), SpO2 97 %. Wt Readings from Last 3 Encounters:  12/30/20 205 lb 4 oz (93.1 kg)  12/30/20 207 lb 1.6 oz (93.9 kg)  11/30/20 208 lb 9.6 oz (94.6 kg)   Body mass index is 28.88 kg/m. Performance status (ECOG): 1 Physical Exam Constitutional:      Appearance: Normal appearance. He is not ill-appearing.  HENT:     Mouth/Throat:     Mouth: Mucous membranes are moist.     Pharynx: Oropharynx is clear. No oropharyngeal exudate or posterior oropharyngeal erythema.  Cardiovascular:     Rate and Rhythm: Normal rate and regular rhythm.     Heart sounds: No murmur heard. No friction rub. No gallop.   Pulmonary:     Effort: Pulmonary effort is normal. No  respiratory distress.     Breath sounds: Normal breath sounds. No wheezing, rhonchi or rales.  Chest:  Breasts:     Right: No axillary adenopathy or supraclavicular adenopathy.     Left: No axillary adenopathy or supraclavicular adenopathy.    Abdominal:     General: Bowel sounds are normal. There is no distension.     Palpations: Abdomen is soft. There is no mass.     Tenderness: There is no abdominal tenderness.  Musculoskeletal:        General: No swelling.     Right lower leg: No edema.     Left lower leg: No edema.  Lymphadenopathy:     Cervical: No cervical adenopathy.     Upper Body:     Right upper body: No supraclavicular or axillary adenopathy.     Left upper body: No supraclavicular or axillary adenopathy.     Lower Body: No right inguinal adenopathy. No left inguinal adenopathy.  Skin:    General: Skin is warm.     Coloration: Skin is not jaundiced.     Findings: No lesion or rash.  Neurological:     General: No focal deficit present.     Mental Status: He is alert and oriented to person, place, and time. Mental status is at baseline.     Cranial Nerves: Cranial nerves are intact.  Psychiatric:        Mood and Affect: Mood normal.  Behavior: Behavior normal.        Thought Content: Thought content normal.    LABS:    Ref. Range 11/30/2020 09:42 12/29/2020  Prostate Specific Ag, Serum Latest Ref Range: 0.0 - 4.0 ng/mL 0.2 0.2    ASSESSMENT & PLAN:  Assessment/Plan:  A 70 y.o. male with metastatic prostate cancer.  Although detectable, his PSA remains very low and stable.  I do not believe a change in therapy is warranted at this time.  He will continue to take Lupron/enzalutamide for now.  He will also remain on Zometa.  He will continue taking Fentanyl for his long-acting pain medication.  He also knows to take MSIR and ibuprofen for breakthrough pain.  I will see him back in  3 months for repeat clinical assessment.  The patient understands all the plans  discussed today and is in agreement with them.    I, Rita Ohara, am acting as scribe for Marice Potter, MD    I have reviewed this report as typed by the medical scribe, and it is complete and accurate.  Dequincy Macarthur Critchley, MD

## 2020-12-29 ENCOUNTER — Inpatient Hospital Stay: Payer: Medicare Other | Attending: Oncology

## 2020-12-29 ENCOUNTER — Encounter: Payer: Self-pay | Admitting: Hematology and Oncology

## 2020-12-29 ENCOUNTER — Other Ambulatory Visit: Payer: Self-pay

## 2020-12-29 ENCOUNTER — Encounter: Payer: Self-pay | Admitting: Oncology

## 2020-12-29 DIAGNOSIS — D649 Anemia, unspecified: Secondary | ICD-10-CM | POA: Diagnosis not present

## 2020-12-29 DIAGNOSIS — C7951 Secondary malignant neoplasm of bone: Secondary | ICD-10-CM | POA: Insufficient documentation

## 2020-12-29 DIAGNOSIS — Z79899 Other long term (current) drug therapy: Secondary | ICD-10-CM | POA: Diagnosis not present

## 2020-12-29 DIAGNOSIS — C61 Malignant neoplasm of prostate: Secondary | ICD-10-CM | POA: Diagnosis present

## 2020-12-29 LAB — HEPATIC FUNCTION PANEL
ALT: 19 (ref 10–40)
AST: 21 (ref 14–40)
Alkaline Phosphatase: 86 (ref 25–125)
Bilirubin, Total: 0.6

## 2020-12-29 LAB — BASIC METABOLIC PANEL
BUN: 11 (ref 4–21)
CO2: 24 — AB (ref 13–22)
Chloride: 107 (ref 99–108)
Creatinine: 1.3 (ref 0.6–1.3)
Glucose: 121
Potassium: 4 (ref 3.4–5.3)
Sodium: 139 (ref 137–147)

## 2020-12-29 LAB — COMPREHENSIVE METABOLIC PANEL
Albumin: 4.3 (ref 3.5–5.0)
Calcium: 8.9 (ref 8.7–10.7)

## 2020-12-29 LAB — CBC AND DIFFERENTIAL
HCT: 44 (ref 41–53)
Hemoglobin: 15.2 (ref 13.5–17.5)
Neutrophils Absolute: 5.32
Platelets: 214 (ref 150–399)
WBC: 7

## 2020-12-29 LAB — CBC: RBC: 4.86 (ref 3.87–5.11)

## 2020-12-29 NOTE — Addendum Note (Signed)
Addended by: Juanetta Beets on: 12/29/2020 02:21 PM   Modules accepted: Orders

## 2020-12-30 ENCOUNTER — Other Ambulatory Visit: Payer: Self-pay

## 2020-12-30 ENCOUNTER — Other Ambulatory Visit: Payer: Self-pay | Admitting: Oncology

## 2020-12-30 ENCOUNTER — Inpatient Hospital Stay (INDEPENDENT_AMBULATORY_CARE_PROVIDER_SITE_OTHER): Payer: Medicare Other | Admitting: Oncology

## 2020-12-30 ENCOUNTER — Telehealth: Payer: Self-pay | Admitting: Oncology

## 2020-12-30 ENCOUNTER — Inpatient Hospital Stay: Payer: Medicare Other

## 2020-12-30 VITALS — BP 181/79 | HR 87 | Temp 98.0°F | Resp 18 | Ht 71.0 in | Wt 207.1 lb

## 2020-12-30 VITALS — BP 151/84 | HR 79 | Temp 98.0°F | Resp 18 | Ht 71.0 in | Wt 205.2 lb

## 2020-12-30 DIAGNOSIS — C61 Malignant neoplasm of prostate: Secondary | ICD-10-CM

## 2020-12-30 DIAGNOSIS — C7952 Secondary malignant neoplasm of bone marrow: Secondary | ICD-10-CM

## 2020-12-30 DIAGNOSIS — Z79899 Other long term (current) drug therapy: Secondary | ICD-10-CM | POA: Diagnosis not present

## 2020-12-30 DIAGNOSIS — C7951 Secondary malignant neoplasm of bone: Secondary | ICD-10-CM

## 2020-12-30 LAB — PROSTATE-SPECIFIC AG, SERUM (LABCORP): Prostate Specific Ag, Serum: 0.2 ng/mL (ref 0.0–4.0)

## 2020-12-30 MED ORDER — ZOLEDRONIC ACID 4 MG/100ML IV SOLN
INTRAVENOUS | Status: AC
  Filled 2020-12-30: qty 100

## 2020-12-30 MED ORDER — SODIUM CHLORIDE 0.9 % IV SOLN
Freq: Once | INTRAVENOUS | Status: AC
  Filled 2020-12-30: qty 250

## 2020-12-30 MED ORDER — FENTANYL 50 MCG/HR TD PT72: 1.0000 | MEDICATED_PATCH | TRANSDERMAL | 0 refills | Status: DC

## 2020-12-30 MED ORDER — ZOLEDRONIC ACID 4 MG/100ML IV SOLN
4.0000 mg | Freq: Once | INTRAVENOUS | Status: AC
  Administered 2020-12-30: 4 mg via INTRAVENOUS

## 2020-12-30 NOTE — Patient Instructions (Signed)
Zoledronic Acid Injection (Hypercalcemia, Oncology) What is this medicine? ZOLEDRONIC ACID (ZOE le dron ik AS id) slows calcium loss from bones. It high calcium levels in the blood from some kinds of cancer. It may be used in other people at risk for bone loss. This medicine may be used for other purposes; ask your health care provider or pharmacist if you have questions. COMMON BRAND NAME(S): Zometa What should I tell my health care provider before I take this medicine? They need to know if you have any of these conditions:  cancer  dehydration  dental disease  kidney disease  liver disease  low levels of calcium in the blood  lung or breathing disease (asthma)  receiving steroids like dexamethasone or prednisone  an unusual or allergic reaction to zoledronic acid, other medicines, foods, dyes, or preservatives  pregnant or trying to get pregnant  breast-feeding How should I use this medicine? This drug is injected into a vein. It is given by a health care provider in a hospital or clinic setting. Talk to your health care provider about the use of this drug in children. Special care may be needed. Overdosage: If you think you have taken too much of this medicine contact a poison control center or emergency room at once. NOTE: This medicine is only for you. Do not share this medicine with others. What if I miss a dose? Keep appointments for follow-up doses. It is important not to miss your dose. Call your health care provider if you are unable to keep an appointment. What may interact with this medicine?  certain antibiotics given by injection  NSAIDs, medicines for pain and inflammation, like ibuprofen or naproxen  some diuretics like bumetanide, furosemide  teriparatide  thalidomide This list may not describe all possible interactions. Give your health care provider a list of all the medicines, herbs, non-prescription drugs, or dietary supplements you use. Also tell  them if you smoke, drink alcohol, or use illegal drugs. Some items may interact with your medicine. What should I watch for while using this medicine? Visit your health care provider for regular checks on your progress. It may be some time before you see the benefit from this drug. Some people who take this drug have severe bone, joint, or muscle pain. This drug may also increase your risk for jaw problems or a broken thigh bone. Tell your health care provider right away if you have severe pain in your jaw, bones, joints, or muscles. Tell you health care provider if you have any pain that does not go away or that gets worse. Tell your dentist and dental surgeon that you are taking this drug. You should not have major dental surgery while on this drug. See your dentist to have a dental exam and fix any dental problems before starting this drug. Take good care of your teeth while on this drug. Make sure you see your dentist for regular follow-up appointments. You should make sure you get enough calcium and vitamin D while you are taking this drug. Discuss the foods you eat and the vitamins you take with your health care provider. Check with your health care provider if you have severe diarrhea, nausea, and vomiting, or if you sweat a lot. The loss of too much body fluid may make it dangerous for you to take this drug. You may need blood work done while you are taking this drug. Do not become pregnant while taking this drug. Women should inform their health care provider   if they wish to become pregnant or think they might be pregnant. There is potential for serious harm to an unborn child. Talk to your health care provider for more information. What side effects may I notice from receiving this medicine? Side effects that you should report to your doctor or health care provider as soon as possible:  allergic reactions (skin rash, itching or hives; swelling of the face, lips, or tongue)  bone  pain  infection (fever, chills, cough, sore throat, pain or trouble passing urine)  jaw pain, especially after dental work  joint pain  kidney injury (trouble passing urine or change in the amount of urine)  low blood pressure (dizziness; feeling faint or lightheaded, falls; unusually weak or tired)  low calcium levels (fast heartbeat; muscle cramps or pain; pain, tingling, or numbness in the hands or feet; seizures)  low magnesium levels (fast, irregular heartbeat; muscle cramp or pain; muscle weakness; tremors; seizures)  low red blood cell counts (trouble breathing; feeling faint; lightheaded, falls; unusually weak or tired)  muscle pain  redness, blistering, peeling, or loosening of the skin, including inside the mouth  severe diarrhea  swelling of the ankles, feet, hands  trouble breathing Side effects that usually do not require medical attention (report to your doctor or health care provider if they continue or are bothersome):  anxious  constipation  coughing  depressed mood  eye irritation, itching, or pain  fever  general ill feeling or flu-like symptoms  nausea  pain, redness, or irritation at site where injected  trouble sleeping This list may not describe all possible side effects. Call your doctor for medical advice about side effects. You may report side effects to FDA at 1-800-FDA-1088. Where should I keep my medicine? This drug is given in a hospital or clinic. It will not be stored at home. NOTE: This sheet is a summary. It may not cover all possible information. If you have questions about this medicine, talk to your doctor, pharmacist, or health care provider.  2021 Elsevier/Gold Standard (2019-05-07 09:13:00)  

## 2020-12-30 NOTE — Telephone Encounter (Signed)
Per 5/27 LOS, patient scheduled for Aug Appt's.  Requested patient be give Appt Summary at [facility] today

## 2021-01-06 ENCOUNTER — Other Ambulatory Visit: Payer: Self-pay | Admitting: Hematology and Oncology

## 2021-01-06 DIAGNOSIS — R110 Nausea: Secondary | ICD-10-CM

## 2021-01-23 ENCOUNTER — Encounter: Payer: Self-pay | Admitting: Oncology

## 2021-01-26 ENCOUNTER — Inpatient Hospital Stay: Payer: Medicare Other | Attending: Oncology

## 2021-01-26 ENCOUNTER — Other Ambulatory Visit: Payer: Self-pay

## 2021-01-26 ENCOUNTER — Encounter: Payer: Self-pay | Admitting: Hematology and Oncology

## 2021-01-26 DIAGNOSIS — C7951 Secondary malignant neoplasm of bone: Secondary | ICD-10-CM | POA: Insufficient documentation

## 2021-01-26 DIAGNOSIS — C61 Malignant neoplasm of prostate: Secondary | ICD-10-CM | POA: Insufficient documentation

## 2021-01-26 LAB — BASIC METABOLIC PANEL
BUN: 14 (ref 4–21)
CO2: 22 (ref 13–22)
Chloride: 107 (ref 99–108)
Creatinine: 1.1 (ref 0.6–1.3)
Glucose: 97
Potassium: 3.7 (ref 3.4–5.3)
Sodium: 140 (ref 137–147)

## 2021-01-26 LAB — COMPREHENSIVE METABOLIC PANEL: Calcium: 8.9 (ref 8.7–10.7)

## 2021-01-27 ENCOUNTER — Inpatient Hospital Stay: Payer: Medicare Other

## 2021-01-27 DIAGNOSIS — C61 Malignant neoplasm of prostate: Secondary | ICD-10-CM | POA: Diagnosis present

## 2021-01-27 DIAGNOSIS — C7952 Secondary malignant neoplasm of bone marrow: Secondary | ICD-10-CM

## 2021-01-27 DIAGNOSIS — C7951 Secondary malignant neoplasm of bone: Secondary | ICD-10-CM | POA: Diagnosis not present

## 2021-01-27 MED ORDER — ZOLEDRONIC ACID 4 MG/100ML IV SOLN
4.0000 mg | Freq: Once | INTRAVENOUS | Status: AC
Start: 2021-01-27 — End: 2021-01-27
  Administered 2021-01-27: 4 mg via INTRAVENOUS

## 2021-01-27 MED ORDER — SODIUM CHLORIDE 0.9 % IV SOLN
Freq: Once | INTRAVENOUS | Status: AC
  Filled 2021-01-27: qty 250

## 2021-01-27 MED ORDER — ZOLEDRONIC ACID 4 MG/100ML IV SOLN
INTRAVENOUS | Status: AC
  Filled 2021-01-27: qty 100

## 2021-01-27 NOTE — Progress Notes (Signed)
Pt d/c stable at 1435

## 2021-01-27 NOTE — Patient Instructions (Signed)

## 2021-01-30 ENCOUNTER — Other Ambulatory Visit: Payer: Self-pay | Admitting: Oncology

## 2021-01-30 DIAGNOSIS — M545 Low back pain, unspecified: Secondary | ICD-10-CM

## 2021-01-30 DIAGNOSIS — C61 Malignant neoplasm of prostate: Secondary | ICD-10-CM

## 2021-01-30 DIAGNOSIS — C7951 Secondary malignant neoplasm of bone: Secondary | ICD-10-CM

## 2021-02-02 ENCOUNTER — Other Ambulatory Visit: Payer: Self-pay | Admitting: Pharmacist

## 2021-02-02 ENCOUNTER — Other Ambulatory Visit: Payer: Self-pay | Admitting: Hematology and Oncology

## 2021-02-02 ENCOUNTER — Encounter: Payer: Self-pay | Admitting: Oncology

## 2021-02-02 ENCOUNTER — Other Ambulatory Visit: Payer: Self-pay

## 2021-02-02 DIAGNOSIS — C7951 Secondary malignant neoplasm of bone: Secondary | ICD-10-CM

## 2021-02-02 DIAGNOSIS — G8929 Other chronic pain: Secondary | ICD-10-CM

## 2021-02-02 MED ORDER — FENTANYL 50 MCG/HR TD PT72: MEDICATED_PATCH | TRANSDERMAL | 0 refills | Status: DC

## 2021-02-22 ENCOUNTER — Encounter: Payer: Self-pay | Admitting: Hematology and Oncology

## 2021-02-22 ENCOUNTER — Other Ambulatory Visit: Payer: Self-pay

## 2021-02-22 ENCOUNTER — Inpatient Hospital Stay: Payer: Medicare Other | Attending: Oncology

## 2021-02-22 DIAGNOSIS — Z79899 Other long term (current) drug therapy: Secondary | ICD-10-CM | POA: Insufficient documentation

## 2021-02-22 DIAGNOSIS — Z5111 Encounter for antineoplastic chemotherapy: Secondary | ICD-10-CM | POA: Insufficient documentation

## 2021-02-22 DIAGNOSIS — C61 Malignant neoplasm of prostate: Secondary | ICD-10-CM | POA: Insufficient documentation

## 2021-02-22 DIAGNOSIS — C7951 Secondary malignant neoplasm of bone: Secondary | ICD-10-CM | POA: Insufficient documentation

## 2021-02-22 LAB — BASIC METABOLIC PANEL
BUN: 10 (ref 4–21)
CO2: 27 — AB (ref 13–22)
Chloride: 107 (ref 99–108)
Creatinine: 1.2 (ref 0.6–1.3)
Glucose: 115
Potassium: 3.6 (ref 3.4–5.3)
Sodium: 141 (ref 137–147)

## 2021-02-22 LAB — COMPREHENSIVE METABOLIC PANEL: Calcium: 8.7 (ref 8.7–10.7)

## 2021-02-24 ENCOUNTER — Other Ambulatory Visit: Payer: Self-pay

## 2021-02-24 ENCOUNTER — Inpatient Hospital Stay: Payer: Medicare Other

## 2021-02-24 ENCOUNTER — Encounter: Payer: Self-pay | Admitting: Oncology

## 2021-02-24 VITALS — BP 151/94 | HR 78 | Temp 97.8°F | Resp 12 | Wt 208.0 lb

## 2021-02-24 DIAGNOSIS — C7951 Secondary malignant neoplasm of bone: Secondary | ICD-10-CM | POA: Diagnosis not present

## 2021-02-24 DIAGNOSIS — Z79899 Other long term (current) drug therapy: Secondary | ICD-10-CM | POA: Diagnosis not present

## 2021-02-24 DIAGNOSIS — Z5111 Encounter for antineoplastic chemotherapy: Secondary | ICD-10-CM | POA: Diagnosis not present

## 2021-02-24 DIAGNOSIS — C7952 Secondary malignant neoplasm of bone marrow: Secondary | ICD-10-CM

## 2021-02-24 DIAGNOSIS — C61 Malignant neoplasm of prostate: Secondary | ICD-10-CM | POA: Diagnosis present

## 2021-02-24 MED ORDER — ZOLEDRONIC ACID 4 MG/100ML IV SOLN
4.0000 mg | Freq: Once | INTRAVENOUS | Status: AC
  Administered 2021-02-24: 4 mg via INTRAVENOUS

## 2021-02-24 MED ORDER — ZOLEDRONIC ACID 4 MG/100ML IV SOLN
INTRAVENOUS | Status: AC
  Filled 2021-02-24: qty 100

## 2021-02-24 MED ORDER — SODIUM CHLORIDE 0.9 % IV SOLN
Freq: Once | INTRAVENOUS | Status: AC
  Filled 2021-02-24: qty 250

## 2021-02-24 MED ORDER — LEUPROLIDE ACETATE (3 MONTH) 22.5 MG IM KIT
PACK | INTRAMUSCULAR | Status: AC
  Filled 2021-02-24: qty 22.5

## 2021-02-24 MED ORDER — LEUPROLIDE ACETATE (3 MONTH) 22.5 MG IM KIT
22.5000 mg | PACK | Freq: Once | INTRAMUSCULAR | Status: AC
  Administered 2021-02-24: 22.5 mg via INTRAMUSCULAR

## 2021-02-24 NOTE — Patient Instructions (Signed)
Leuprolide injection What is this medication? LEUPROLIDE (loo PROE lide) is a man-made hormone. It is used to treat the symptoms of prostate cancer. This medicine may also be used to treat childrenwith early onset of puberty. It may be used for other hormonal conditions. This medicine may be used for other purposes; ask your health care provider orpharmacist if you have questions. COMMON BRAND NAME(S): Lupron What should I tell my care team before I take this medication? They need to know if you have any of these conditions: diabetes heart disease or previous heart attack high blood pressure high cholesterol pain or difficulty passing urine spinal cord metastasis stroke tobacco smoker an unusual or allergic reaction to leuprolide, benzyl alcohol, other medicines, foods, dyes, or preservatives pregnant or trying to get pregnant breast-feeding How should I use this medication? This medicine is for injection under the skin or into a muscle. You will be taught how to prepare and give this medicine. Use exactly as directed. Take your medicine at regular intervals. Do not take your medicine more often thandirected. It is important that you put your used needles and syringes in a special sharps container. Do not put them in a trash can. If you do not have a sharpscontainer, call your pharmacist or healthcare provider to get one. A special MedGuide will be given to you by the pharmacist with eachprescription and refill. Be sure to read this information carefully each time. Talk to your pediatrician regarding the use of this medicine in children. While this medicine may be prescribed for children as young as 8 years for selectedconditions, precautions do apply. Overdosage: If you think you have taken too much of this medicine contact apoison control center or emergency room at once. NOTE: This medicine is only for you. Do not share this medicine with others. What if I miss a dose? If you miss a  dose, take it as soon as you can. If it is almost time for yournext dose, take only that dose. Do not take double or extra doses. What may interact with this medication? Do not take this medicine with any of the following medications: chasteberry cisapride dronedarone pimozide thioridazine This medicine may also interact with the following medications: herbal or dietary supplements, like black cohosh or DHEA male hormones, like estrogens or progestins and birth control pills, patches, rings, or injections male hormones, like testosterone other medicines that prolong the QT interval (abnormal heart rhythm) This list may not describe all possible interactions. Give your health care provider a list of all the medicines, herbs, non-prescription drugs, or dietary supplements you use. Also tell them if you smoke, drink alcohol, or use illegaldrugs. Some items may interact with your medicine. What should I watch for while using this medication? Visit your doctor or health care professional for regular checks on your progress. During the first week, your symptoms may get worse, but then will improve as you continue your treatment. You may get hot flashes, increased bone pain, increased difficulty passing urine, or an aggravation of nerve symptoms. Discuss these effects with your doctor or health care professional, some ofthem may improve with continued use of this medicine. Male patients may experience a menstrual cycle or spotting during the first 2 months of therapy with this medicine. If this continues, contact your doctor orhealth care professional. This medicine may increase blood sugar. Ask your healthcare provider if changesin diet or medicines are needed if you have diabetes. What side effects may I notice from receiving this medication? Side   effects that you should report to your doctor or health care professionalas soon as possible: allergic reactions like skin rash, itching or hives,  swelling of the face, lips, or tongue breathing problems chest pain depression or memory disorders pain in your legs or groin pain at site where injected severe headache signs and symptoms of high blood sugar such as being more thirsty or hungry or having to urinate more than normal. You may also feel very tired or have blurry vision swelling of the feet and legs visual changes vomiting Side effects that usually do not require medical attention (report to yourdoctor or health care professional if they continue or are bothersome): breast swelling or tenderness decrease in sex drive or performance diarrhea hot flashes loss of appetite muscle, joint, or bone pains nausea redness or irritation at site where injected skin problems or acne This list may not describe all possible side effects. Call your doctor for medical advice about side effects. You may report side effects to FDA at1-800-FDA-1088. Where should I keep my medication? Keep out of the reach of children. Store below 25 degrees C (77 degrees F). Do not freeze. Protect from light. Do not use if it is not clear or if there are particles present. Throw away anyunused medicine after the expiration date. NOTE: This sheet is a summary. It may not cover all possible information. If you have questions about this medicine, talk to your doctor, pharmacist, orhealth care provider.  2022 Elsevier/Gold Standard (2019-06-24 10:57:41) Zoledronic Acid Injection (Hypercalcemia, Oncology) What is this medication? ZOLEDRONIC ACID (ZOE le dron ik AS id) slows calcium loss from bones. It high calcium levels in the blood from some kinds of cancer. It may be used in otherpeople at risk for bone loss. This medicine may be used for other purposes; ask your health care provider orpharmacist if you have questions. COMMON BRAND NAME(S): Zometa What should I tell my care team before I take this medication? They need to know if you have any of these  conditions: cancer dehydration dental disease kidney disease liver disease low levels of calcium in the blood lung or breathing disease (asthma) receiving steroids like dexamethasone or prednisone an unusual or allergic reaction to zoledronic acid, other medicines, foods, dyes, or preservatives pregnant or trying to get pregnant breast-feeding How should I use this medication? This drug is injected into a vein. It is given by a health care provider in [place] or clinic setting. Talk to your health care provider about the use of this drug in children.Special care may be needed. Overdosage: If you think you have taken too much of this medicine contact apoison control center or emergency room at once. NOTE: This medicine is only for you. Do not share this medicine with others. What if I miss a dose? Keep appointments for follow-up doses. It is important not to miss your dose.Call your health care provider if you are unable to keep an appointment. What may interact with this medication? certain antibiotics given by injection NSAIDs, medicines for pain and inflammation, like ibuprofen or naproxen some diuretics like bumetanide, furosemide teriparatide thalidomide This list may not describe all possible interactions. Give your health care provider a list of all the medicines, herbs, non-prescription drugs, or dietary supplements you use. Also tell them if you smoke, drink alcohol, or use illegaldrugs. Some items may interact with your medicine. What should I watch for while using this medication? Visit your health care provider for regular checks on your progress. It may besome  time before you see the benefit from this drug. Some people who take this drug have severe bone, joint, or muscle pain. This drug may also increase your risk for jaw problems or a broken thigh bone. Tell your health care provider right away if you have severe pain in your jaw, bones, joints, or muscles. Tell you health  care provider if you have any painthat does not go away or that gets worse. Tell your dentist and dental surgeon that you are taking this drug. You should not have major dental surgery while on this drug. See your dentist to have a dental exam and fix any dental problems before starting this drug. Take good care of your teeth while on this drug. Make sure you see your dentist forregular follow-up appointments. You should make sure you get enough calcium and vitamin D while you are taking this drug. Discuss the foods you eat and the vitamins you take with your healthcare provider. Check with your health care provider if you have severe diarrhea, nausea, and vomiting, or if you sweat a lot. The loss of too much body fluid may make itdangerous for you to take this drug. You may need blood work done while you are taking this drug. Do not become pregnant while taking this drug. Women should inform their health care provider if they wish to become pregnant or think they might be pregnant. There is potential for serious harm to an unborn child. Talk to your healthcare provider for more information. What side effects may I notice from receiving this medication? Side effects that you should report to your doctor or health care provider assoon as possible: allergic reactions (skin rash, itching or hives; swelling of the face, lips, or tongue) bone pain infection (fever, chills, cough, sore throat, pain or trouble passing urine) jaw pain, especially after dental work joint pain kidney injury (trouble passing urine or change in the amount of urine) low blood pressure (dizziness; feeling faint or lightheaded, falls; unusually weak or tired) low calcium levels (fast heartbeat; muscle cramps or pain; pain, tingling, or numbness in the hands or feet; seizures) low magnesium levels (fast, irregular heartbeat; muscle cramp or pain; muscle weakness; tremors; seizures) low red blood cell counts (trouble breathing;  feeling faint; lightheaded, falls; unusually weak or tired) muscle pain redness, blistering, peeling, or loosening of the skin, including inside the mouth severe diarrhea swelling of the ankles, feet, hands trouble breathing Side effects that usually do not require medical attention (report to yourdoctor or health care provider if they continue or are bothersome): anxious constipation coughing depressed mood eye irritation, itching, or pain fever general ill feeling or flu-like symptoms nausea pain, redness, or irritation at site where injected trouble sleeping This list may not describe all possible side effects. Call your doctor for medical advice about side effects. You may report side effects to FDA at1-800-FDA-1088. Where should I keep my medication? This drug is given in a hospital or clinic. It will not be stored at home. NOTE: This sheet is a summary. It may not cover all possible information. If you have questions about this medicine, talk to your doctor, pharmacist, orhealth care provider.  2022 Elsevier/Gold Standard (2019-05-07 09:13:00)

## 2021-02-24 NOTE — Addendum Note (Signed)
Addended by: Juanetta Beets on: 02/24/2021 02:18 PM   Modules accepted: Orders

## 2021-02-24 NOTE — Progress Notes (Signed)
Discharged home, stable  

## 2021-03-20 ENCOUNTER — Other Ambulatory Visit: Payer: Self-pay | Admitting: Oncology

## 2021-03-23 ENCOUNTER — Other Ambulatory Visit: Payer: Self-pay | Admitting: Hematology and Oncology

## 2021-03-23 DIAGNOSIS — C7951 Secondary malignant neoplasm of bone: Secondary | ICD-10-CM

## 2021-03-23 DIAGNOSIS — M545 Low back pain, unspecified: Secondary | ICD-10-CM

## 2021-03-23 DIAGNOSIS — G8929 Other chronic pain: Secondary | ICD-10-CM

## 2021-03-24 NOTE — Progress Notes (Signed)
[facility]  [address] [place],  [place]  [address] [phone]  Clinic Day:  04/03/2021  Referring physician: [name], MD  This document serves as a record of services personally performed by Dequincy Macarthur Critchley, MD. It was created on their behalf by [facility] E, a trained medical scribe. The creation of this record is based on the scribe's personal observations and the provider's statements to them.  HISTORY OF PRESENT ILLNESS:  The patient is a 70 y.o. male with metastatic prostate cancer, which includes spread of disease to his bones and retroperitoneal lymph nodes.  He is currently on Lupron/enzalutamide to keep his disease under control.  He also previously underwent palliative radiation to his L3 vertebral body to treat the pain associated with his metastatic bone disease.  As there was persistent lower back pain, he underwent another course of palliative radiation.  Despite this and switching him to Fentanyl, he continues to complain of lower back pain.  Of note, over the past few months, his PSA, although detectable, has been low and stable.    PHYSICAL EXAM:  Blood pressure (!) 217/91, pulse 73, temperature 97.9 F (36.6 C), resp. rate 20, height '5\' 11"'$  (1.803 m), weight 203 lb 1.6 oz (92.1 kg), SpO2 98 %. Wt Readings from Last 3 Encounters:  04/03/21 203 lb 1.6 oz (92.1 kg)  02/24/21 208 lb (94.3 kg)  12/30/20 205 lb 4 oz (93.1 kg)   Body mass index is 28.33 kg/m. Performance status (ECOG): 1 Physical Exam Constitutional:      General: He is not in acute distress.    Appearance: Normal appearance. He is normal weight.  HENT:     Head: Normocephalic and atraumatic.  Eyes:     General: No scleral icterus.    Extraocular Movements: Extraocular movements intact.     Conjunctiva/sclera: Conjunctivae normal.     Pupils: Pupils are equal, round, and reactive to light.  Cardiovascular:     Rate and Rhythm: Normal rate and regular  rhythm.     Pulses: Normal pulses.     Heart sounds: Normal heart sounds. No murmur heard.   No friction rub. No gallop.  Pulmonary:     Effort: Pulmonary effort is normal. No respiratory distress.     Breath sounds: Normal breath sounds.  Abdominal:     General: Bowel sounds are normal. There is no distension.     Palpations: Abdomen is soft. There is no hepatomegaly, splenomegaly or mass.     Tenderness: no abdominal tenderness  Musculoskeletal:        General: Normal range of motion.     Cervical back: Normal range of motion and neck supple.     Right lower leg: No edema.     Left lower leg: No edema.  Lymphadenopathy:     Cervical: No cervical adenopathy.  Skin:    General: Skin is warm and dry.  Neurological:     General: No focal deficit present.     Mental Status: He is alert and oriented to person, place, and time. Mental status is at baseline.  Psychiatric:        Mood and Affect: Mood normal.        Behavior: Behavior normal.        Thought Content: Thought content normal.        Judgment: Judgment normal.   LABS:   Component Ref Range & Units 3 d ago  (03/31/21) 3 mo ago  (  12/29/20) 4 mo ago  (11/30/20) 6 mo ago  (10/05/20) 6 mo ago  (09/21/20) 8 mo ago  (07/20/20) 9 mo ago  (06/24/20)  Prostate Specific Ag, Serum 0.0 - 4.0 ng/mL 0.2  0.2 CM  0.2 CM  0.2 CM  0.1 CM  <0.1 CM  NSGEL     ASSESSMENT & PLAN:  Assessment/Plan:  A 70 y.o. male with metastatic prostate cancer.  Although detectable, his PSA remains very low and stable.  I do not believe a change in therapy is warranted at this time.  He will continue to take Lupron/enzalutamide for now.  He will also remain on Zometa to protect against worsening bone metastases.  As his pain remains suboptimally controlled, his Fentanyl patches will be increased from 50 to 75 mcg, which he knows to change every 3 days.  For breakthrough pain relief, he will be placed on MSIR 15 mg Q 4-6 hours.  He stated that he did not have  any, which was confirmed by his pharmacy.  On another note, this gentleman's blood pressure is poorly controlled.  He readily admits he has not been compliant with his antihypertensive medications.  I made it very clear to him that his poorly controlled blood pressure (not his prostate cancer) is the major health issue which could lead to significant morbidity/mortality over these next few years if he does not take ownership in getting it controlled. He knows to work diligently with his primary care provider in getting it under better control.  Otherwise, I will see him back in 3 months for repeat clinical assessment.  The patient understands all the plans discussed today and is in agreement with them.    I, Rita Ohara, am acting as scribe for Marice Potter, MD    I have reviewed this report as typed by the medical scribe, and it is complete and accurate.  Dequincy Macarthur Critchley, MD

## 2021-03-28 ENCOUNTER — Encounter: Payer: Self-pay | Admitting: Oncology

## 2021-03-28 NOTE — Addendum Note (Signed)
Addended by: Juanetta Beets on: 03/28/2021 03:34 PM   Modules accepted: Orders

## 2021-03-31 ENCOUNTER — Inpatient Hospital Stay: Payer: Medicare Other | Attending: Oncology

## 2021-03-31 ENCOUNTER — Other Ambulatory Visit: Payer: Self-pay | Admitting: Pharmacist

## 2021-03-31 ENCOUNTER — Encounter: Payer: Self-pay | Admitting: Hematology and Oncology

## 2021-03-31 DIAGNOSIS — C7951 Secondary malignant neoplasm of bone: Secondary | ICD-10-CM | POA: Insufficient documentation

## 2021-03-31 DIAGNOSIS — D649 Anemia, unspecified: Secondary | ICD-10-CM | POA: Diagnosis not present

## 2021-03-31 DIAGNOSIS — C61 Malignant neoplasm of prostate: Secondary | ICD-10-CM | POA: Insufficient documentation

## 2021-03-31 LAB — BASIC METABOLIC PANEL
BUN: 12 (ref 4–21)
CO2: 26 — AB (ref 13–22)
Chloride: 105 (ref 99–108)
Creatinine: 1.2 (ref 0.6–1.3)
Glucose: 101
Potassium: 4.2 (ref 3.4–5.3)
Sodium: 141 (ref 137–147)

## 2021-03-31 LAB — HEPATIC FUNCTION PANEL
ALT: 17 (ref 10–40)
AST: 25 (ref 14–40)
Alkaline Phosphatase: 82 (ref 25–125)
Bilirubin, Total: 0.4

## 2021-03-31 LAB — CBC AND DIFFERENTIAL
HCT: 41 (ref 41–53)
Hemoglobin: 14.3 (ref 13.5–17.5)
Neutrophils Absolute: 3.9
Platelets: 234 (ref 150–399)
WBC: 6.5

## 2021-03-31 LAB — CBC: RBC: 4.49 (ref 3.87–5.11)

## 2021-03-31 LAB — COMPREHENSIVE METABOLIC PANEL
Albumin: 4.2 (ref 3.5–5.0)
Calcium: 9.3 (ref 8.7–10.7)

## 2021-04-01 LAB — PROSTATE-SPECIFIC AG, SERUM (LABCORP): Prostate Specific Ag, Serum: 0.2 ng/mL (ref 0.0–4.0)

## 2021-04-03 ENCOUNTER — Telehealth: Payer: Self-pay | Admitting: Oncology

## 2021-04-03 ENCOUNTER — Other Ambulatory Visit: Payer: Self-pay | Admitting: Oncology

## 2021-04-03 ENCOUNTER — Inpatient Hospital Stay (INDEPENDENT_AMBULATORY_CARE_PROVIDER_SITE_OTHER): Payer: Medicare Other | Admitting: Oncology

## 2021-04-03 ENCOUNTER — Other Ambulatory Visit: Payer: Self-pay

## 2021-04-03 ENCOUNTER — Inpatient Hospital Stay: Payer: Medicare Other

## 2021-04-03 VITALS — BP 217/91 | HR 73 | Temp 97.9°F | Resp 20 | Ht 71.0 in | Wt 203.1 lb

## 2021-04-03 VITALS — BP 170/82 | HR 70 | Temp 97.7°F | Resp 18

## 2021-04-03 DIAGNOSIS — C7951 Secondary malignant neoplasm of bone: Secondary | ICD-10-CM | POA: Diagnosis not present

## 2021-04-03 DIAGNOSIS — C61 Malignant neoplasm of prostate: Secondary | ICD-10-CM

## 2021-04-03 DIAGNOSIS — C7952 Secondary malignant neoplasm of bone marrow: Secondary | ICD-10-CM

## 2021-04-03 MED ORDER — FENTANYL 75 MCG/HR TD PT72: 1.0000 | MEDICATED_PATCH | TRANSDERMAL | 0 refills | Status: DC

## 2021-04-03 MED ORDER — MORPHINE SULFATE 15 MG PO TABS: 15.0000 mg | ORAL_TABLET | ORAL | 0 refills | Status: DC | PRN

## 2021-04-03 MED ORDER — SODIUM CHLORIDE 0.9 % IV SOLN: Freq: Once | INTRAVENOUS | Status: AC

## 2021-04-03 MED ORDER — ZOLEDRONIC ACID 4 MG/100ML IV SOLN
4.0000 mg | Freq: Once | INTRAVENOUS | Status: AC
  Administered 2021-04-03: 4 mg via INTRAVENOUS
  Filled 2021-04-03: qty 100

## 2021-04-03 MED ORDER — ONDANSETRON 8 MG PO TBDP: 8.0000 mg | ORAL_TABLET | Freq: Three times a day (TID) | ORAL | 0 refills | Status: DC | PRN

## 2021-04-03 NOTE — Patient Instructions (Signed)

## 2021-04-03 NOTE — Telephone Encounter (Signed)
Per 8/29 los next appt scheduled and given to patient

## 2021-04-17 DIAGNOSIS — C7951 Secondary malignant neoplasm of bone: Secondary | ICD-10-CM | POA: Diagnosis not present

## 2021-04-17 DIAGNOSIS — H532 Diplopia: Secondary | ICD-10-CM | POA: Diagnosis not present

## 2021-04-17 DIAGNOSIS — Z79891 Long term (current) use of opiate analgesic: Secondary | ICD-10-CM | POA: Diagnosis not present

## 2021-04-17 DIAGNOSIS — Z8673 Personal history of transient ischemic attack (TIA), and cerebral infarction without residual deficits: Secondary | ICD-10-CM | POA: Diagnosis not present

## 2021-04-17 DIAGNOSIS — K5730 Diverticulosis of large intestine without perforation or abscess without bleeding: Secondary | ICD-10-CM | POA: Diagnosis not present

## 2021-04-17 DIAGNOSIS — I509 Heart failure, unspecified: Secondary | ICD-10-CM | POA: Diagnosis not present

## 2021-04-17 DIAGNOSIS — Z79899 Other long term (current) drug therapy: Secondary | ICD-10-CM | POA: Diagnosis not present

## 2021-04-17 DIAGNOSIS — R112 Nausea with vomiting, unspecified: Secondary | ICD-10-CM | POA: Diagnosis not present

## 2021-04-17 DIAGNOSIS — J449 Chronic obstructive pulmonary disease, unspecified: Secondary | ICD-10-CM | POA: Diagnosis not present

## 2021-04-17 DIAGNOSIS — Z87891 Personal history of nicotine dependence: Secondary | ICD-10-CM | POA: Diagnosis not present

## 2021-04-17 DIAGNOSIS — J439 Emphysema, unspecified: Secondary | ICD-10-CM | POA: Diagnosis not present

## 2021-04-17 DIAGNOSIS — I639 Cerebral infarction, unspecified: Secondary | ICD-10-CM | POA: Diagnosis not present

## 2021-04-17 DIAGNOSIS — M4328 Fusion of spine, sacral and sacrococcygeal region: Secondary | ICD-10-CM | POA: Diagnosis not present

## 2021-04-17 DIAGNOSIS — I110 Hypertensive heart disease with heart failure: Secondary | ICD-10-CM | POA: Diagnosis not present

## 2021-04-17 DIAGNOSIS — G893 Neoplasm related pain (acute) (chronic): Secondary | ICD-10-CM | POA: Diagnosis not present

## 2021-04-17 DIAGNOSIS — R10813 Right lower quadrant abdominal tenderness: Secondary | ICD-10-CM | POA: Diagnosis not present

## 2021-04-18 ENCOUNTER — Other Ambulatory Visit: Payer: Self-pay | Admitting: Hematology and Oncology

## 2021-04-18 ENCOUNTER — Telehealth: Payer: Self-pay

## 2021-04-18 DIAGNOSIS — C61 Malignant neoplasm of prostate: Secondary | ICD-10-CM

## 2021-04-18 NOTE — Telephone Encounter (Addendum)
I spoke with pt's wife. They will be here for labs and f/u with Melissa in the morning.  ----- Message from Melodye Ped, NP sent at 04/18/2021 12:29 PM EDT ----- Regarding: RE: Uncontrolled nausea, dry heaves - went to ER 04/17/21' Req an appt Yeah, we go through this cycle pretty often. He goes back and forth with using his patch and taking it off because he thinks it makes him sick. I can see him if he wants to come in. Maybe we can get some fluids and IV meds in him. ----- Message ----- From: Dairl Ponder, RN Sent: 04/18/2021  12:05 PM EDT To: Melodye Ped, NP Subject: Uncontrolled nausea, dry heaves - went to ER#  Pt's wife called to req an appt. She stated that she took Ansh to ER yesterday. When I called back, I spoke with Cannon. He states he went to the ER because of vomiting, dry heaves and increased back pain. They took xray's and did blood work and sent him home. He is alternating the Compazine and Zofran, but he states it isn't helping. I asked him if he still had any lorazepam. He replied, "No". He also stated his blood pressure was still high in the ER. Dr Helene Kelp had increased his medication to 50 a couple weeks back. I asked if he could tell me which medication was increased. He replied, "No,my wife takes care of that". She is not @ home @ present.   He is on 67mg fentanyl patch q 3days, & takes 2-3 tabs of MSIR '15mg'$  per day. This makes his back pain tolerable.

## 2021-04-19 ENCOUNTER — Encounter: Payer: Self-pay | Admitting: Hematology and Oncology

## 2021-04-19 ENCOUNTER — Other Ambulatory Visit: Payer: Self-pay | Admitting: Hematology and Oncology

## 2021-04-19 ENCOUNTER — Inpatient Hospital Stay: Payer: Medicare Other | Attending: Oncology | Admitting: Hematology and Oncology

## 2021-04-19 ENCOUNTER — Inpatient Hospital Stay: Payer: Medicare Other

## 2021-04-19 ENCOUNTER — Other Ambulatory Visit: Payer: Self-pay

## 2021-04-19 VITALS — BP 147/50 | HR 68 | Temp 97.7°F | Resp 18 | Ht 71.0 in | Wt 198.2 lb

## 2021-04-19 DIAGNOSIS — F17210 Nicotine dependence, cigarettes, uncomplicated: Secondary | ICD-10-CM | POA: Diagnosis not present

## 2021-04-19 DIAGNOSIS — C61 Malignant neoplasm of prostate: Secondary | ICD-10-CM

## 2021-04-19 DIAGNOSIS — C7951 Secondary malignant neoplasm of bone: Secondary | ICD-10-CM

## 2021-04-19 DIAGNOSIS — Z79899 Other long term (current) drug therapy: Secondary | ICD-10-CM | POA: Diagnosis not present

## 2021-04-19 DIAGNOSIS — C7952 Secondary malignant neoplasm of bone marrow: Secondary | ICD-10-CM

## 2021-04-19 DIAGNOSIS — D649 Anemia, unspecified: Secondary | ICD-10-CM | POA: Diagnosis not present

## 2021-04-19 LAB — HEPATIC FUNCTION PANEL
ALT: 13 (ref 10–40)
AST: 22 (ref 14–40)
Alkaline Phosphatase: 74 (ref 25–125)
Bilirubin, Total: 0.5

## 2021-04-19 LAB — CBC AND DIFFERENTIAL
HCT: 41 (ref 41–53)
Hemoglobin: 13.8 (ref 13.5–17.5)
Neutrophils Absolute: 4.83
Platelets: 200 (ref 150–399)
WBC: 6.8

## 2021-04-19 LAB — BASIC METABOLIC PANEL
BUN: 11 (ref 4–21)
CO2: 27 — AB (ref 13–22)
Chloride: 107 (ref 99–108)
Creatinine: 1.2 (ref 0.6–1.3)
Glucose: 104
Potassium: 3.7 (ref 3.4–5.3)
Sodium: 143 (ref 137–147)

## 2021-04-19 LAB — CBC: RBC: 4.46 (ref 3.87–5.11)

## 2021-04-19 LAB — COMPREHENSIVE METABOLIC PANEL
Albumin: 4.3 (ref 3.5–5.0)
Calcium: 8.9 (ref 8.7–10.7)

## 2021-04-19 LAB — PSA: Prostatic Specific Antigen: 0.1 ng/mL (ref 0.00–4.00)

## 2021-04-19 MED ORDER — MORPHINE SULFATE 30 MG PO TABS: 30.0000 mg | ORAL_TABLET | ORAL | 0 refills | Status: DC | PRN

## 2021-04-19 MED ORDER — ONDANSETRON HCL 4 MG/2ML IJ SOLN
8.0000 mg | Freq: Once | INTRAMUSCULAR | Status: AC
  Administered 2021-04-19: 8 mg via INTRAVENOUS
  Filled 2021-04-19: qty 4

## 2021-04-19 MED ORDER — ALTEPLASE 2 MG IJ SOLR: 2.0000 mg | Freq: Once | INTRAMUSCULAR | Status: DC | PRN

## 2021-04-19 MED ORDER — HYDROMORPHONE HCL 1 MG/ML IJ SOLN
1.0000 mg | INTRAMUSCULAR | Status: DC | PRN
Start: 2021-04-19 — End: 2021-04-19
  Administered 2021-04-19: 1 mg via INTRAVENOUS
  Filled 2021-04-19: qty 1

## 2021-04-19 MED ORDER — HEPARIN SOD (PORK) LOCK FLUSH 100 UNIT/ML IV SOLN: 250.0000 [IU] | Freq: Once | INTRAVENOUS | Status: DC | PRN

## 2021-04-19 MED ORDER — HEPARIN SOD (PORK) LOCK FLUSH 100 UNIT/ML IV SOLN: 500.0000 [IU] | Freq: Once | INTRAVENOUS | Status: DC | PRN

## 2021-04-19 MED ORDER — SODIUM CHLORIDE 0.9% FLUSH: 3.0000 mL | Freq: Once | INTRAVENOUS | Status: DC | PRN

## 2021-04-19 MED ORDER — SODIUM CHLORIDE 0.9 % IV SOLN
Freq: Once | INTRAVENOUS | Status: AC
Start: 2021-04-19 — End: 2021-04-19

## 2021-04-19 MED ORDER — SODIUM CHLORIDE 0.9 % IV SOLN: 8.0000 mg | Freq: Once | INTRAVENOUS | Status: DC

## 2021-04-19 MED ORDER — HYDROMORPHONE HCL 1 MG/ML IJ SOLN
1.0000 mg | Freq: Once | INTRAMUSCULAR | Status: AC
Start: 2021-04-19 — End: 2021-04-19
  Administered 2021-04-19: 1 mg via INTRAVENOUS
  Filled 2021-04-19: qty 1

## 2021-04-19 MED ORDER — SODIUM CHLORIDE 0.9% FLUSH: 10.0000 mL | Freq: Once | INTRAVENOUS | Status: DC | PRN

## 2021-04-19 NOTE — Progress Notes (Signed)
[place]  [address] [place],  [place]  [address] [phone]  Clinic Day:  04/19/2021  Referring physician: [name], MD  ASSESSMENT & PLAN:   Assessment & Plan: Secondary malignant neoplasm of bone and bone marrow [facility]) CT imaging from ED reveals increasing bony metastasis. We will refer to [facility] for possible Xofigo treatment. He will continue with zometa in a couple of weeks as scheduled. We will give IVF today with IV nausea and pain meds to hopefully make him more comfortable. He will increase his morphine to 30 mg every 4 -6 hours as needed.   Malignant neoplasm of prostate [facility]) He will continue treatment with Xtandi and Lupron as his PSA remains detectable, but low. He will continue follow up with Dr. Bobby Rumpf as scheduled.    The patient understands the plans discussed today and is in agreement with them.  He knows to contact our office if he develops concerns prior to his next appointment.   Melodye Ped, NP  [facility] [address] [place] [address] Dept: [phone] Dept Fax: [phone]   Orders Placed This Encounter  Procedures   CBC and differential    This external order was created through the Results Console.   CBC    This external order was created through the Results Console.   Basic metabolic panel    This external order was created through the Results Console.   Comprehensive metabolic panel    This external order was created through the Results Console.   Hepatic function panel    This external order was created through the Results Console.       CHIEF COMPLAINT:  CC: A 70 year old male with history of prostate spread of disease to bones.   Current Treatment:  Xtandi/ Lupron; Zometa   HISTORY OF PRESENT ILLNESS:   Oncology History   No history exists.  The patient is a 70 y.o. male with metastatic prostate cancer,  which includes spread of disease to his bones and retroperitoneal lymph nodes.  He is currently on Lupron/enzalutamide to keep his disease under control.  He also previously underwent palliative radiation to his L3 vertebral body to treat the pain associated with his metastatic bone disease.  As there was persistent lower back pain, he underwent another course of palliative radiation.  Despite this and switching him to Fentanyl, he continues to complain of lower back pain.  Of note, over the past few months, his PSA has been rising, but at very minor levels.  INTERVAL HISTORY:  Ashanti is here today for symptom management visit. He was evaluated in the ED over the weekend for increasing pain to lower back uncontrolled with fentanyl and morphine. CT imaging revealed new bony mets to the lower back. His nausea has increased due to the pain without much relief from zofran, compazine or ativan. He denies shortness of breath, chest pain or cough. He denies issue with bowel or bladder. He is unable to eat or drink very much. CBC and CMP are unremarkable. We discussed possible Xofigo treatments with Nuclear Medicine in [place]. Both he and his wife are agreeable.   REVIEW OF SYSTEMS:  Review of Systems  Constitutional:  Positive for appetite change and fatigue. Negative for chills, diaphoresis, fever and unexpected weight change.  HENT:   Negative for hearing loss, lump/mass, mouth sores, nosebleeds, sore throat, tinnitus, trouble swallowing and voice change.   Eyes:  Negative for eye problems and icterus.  Respiratory:  Negative for chest tightness, cough, hemoptysis, shortness of breath and wheezing.   Cardiovascular:  Negative for chest pain, leg swelling and palpitations.  Gastrointestinal:  Positive for nausea and vomiting. Negative for abdominal distention, abdominal pain, blood in stool, constipation, diarrhea and rectal pain.  Endocrine: Negative for hot flashes.  Genitourinary:  Negative for bladder  incontinence, difficulty urinating, dyspareunia, dysuria, frequency, hematuria and nocturia.   Musculoskeletal:  Positive for back pain. Negative for arthralgias, flank pain, gait problem, myalgias, neck pain and neck stiffness.  Skin:  Negative for itching, rash and wound.  Neurological:  Negative for dizziness, extremity weakness, gait problem, headaches, light-headedness, numbness, seizures and speech difficulty.  Hematological:  Negative for adenopathy. Does not bruise/bleed easily.  Psychiatric/Behavioral:  Negative for confusion, decreased concentration, depression, sleep disturbance and suicidal ideas. The patient is not nervous/anxious.     VITALS:  Blood pressure (!) 191/88, pulse 73, temperature 97.6 F (36.4 C), temperature source Oral, resp. rate 18, height '5\' 11"'$  (1.803 m), weight 198 lb 3.2 oz (89.9 kg), SpO2 98 %.  Wt Readings from Last 3 Encounters:  04/19/21 198 lb 3.2 oz (89.9 kg)  04/03/21 203 lb 1.6 oz (92.1 kg)  02/24/21 208 lb (94.3 kg)    Body mass index is 27.64 kg/m.  Performance status (ECOG): 1 - Symptomatic but completely ambulatory  PHYSICAL EXAM:  Physical Exam Constitutional:      General: He is not in acute distress.    Appearance: Normal appearance. He is normal weight. He is not ill-appearing, toxic-appearing or diaphoretic.  HENT:     Head: Normocephalic and atraumatic.     Nose: Nose normal. No congestion or rhinorrhea.     Mouth/Throat:     Mouth: Mucous membranes are moist.     Pharynx: Oropharynx is clear. No oropharyngeal exudate or posterior oropharyngeal erythema.  Eyes:     General: No scleral icterus.       Right eye: No discharge.        Left eye: No discharge.     Extraocular Movements: Extraocular movements intact.     Conjunctiva/sclera: Conjunctivae normal.     Pupils: Pupils are equal, round, and reactive to light.  Neck:     Vascular: No carotid bruit.  Cardiovascular:     Rate and Rhythm: Normal rate and regular rhythm.      Heart sounds: No murmur heard.   No friction rub. No gallop.  Pulmonary:     Effort: Pulmonary effort is normal. No respiratory distress.     Breath sounds: Normal breath sounds. No stridor. No wheezing, rhonchi or rales.  Chest:     Chest wall: No tenderness.  Abdominal:     General: Abdomen is flat. Bowel sounds are normal. There is no distension.     Palpations: There is no mass.     Tenderness: There is no abdominal tenderness. There is no right CVA tenderness, left CVA tenderness, guarding or rebound.     Hernia: No hernia is present.  Musculoskeletal:        General: No swelling, tenderness, deformity or signs of injury. Normal range of motion.     Cervical back: Normal range of motion and neck supple. No rigidity or tenderness.     Right lower leg: No edema.     Left lower leg: No edema.  Lymphadenopathy:     Cervical: No cervical adenopathy.  Skin:    General: Skin is warm and  dry.     Capillary Refill: Capillary refill takes less than 2 seconds.     Coloration: Skin is not jaundiced or pale.     Findings: No bruising, erythema, lesion or rash.  Neurological:     General: No focal deficit present.     Mental Status: He is alert and oriented to person, place, and time. Mental status is at baseline.     Cranial Nerves: No cranial nerve deficit.     Sensory: No sensory deficit.     Motor: No weakness.     Coordination: Coordination normal.     Gait: Gait normal.     Deep Tendon Reflexes: Reflexes normal.  Psychiatric:        Mood and Affect: Mood normal.        Behavior: Behavior normal.        Thought Content: Thought content normal.        Judgment: Judgment normal.    LABS:   CBC Latest Ref Rng & Units 04/19/2021 03/31/2021 12/29/2020  WBC - 6.8 6.5 7.0  Hemoglobin 13.5 - 17.5 13.8 14.3 15.2  Hematocrit 41 - 53 41 41 44  Platelets 150 - 399 200 234 214   CMP Latest Ref Rng & Units 04/19/2021 03/31/2021 02/22/2021  Glucose 70 - 99 mg/dL - - -  BUN 4 - '21 11 12 10   '$ Creatinine 0.6 - 1.3 1.2 1.2 1.2  Sodium 137 - 147 143 141 141  Potassium 3.4 - 5.3 3.7 4.2 3.6  Chloride 99 - 108 107 105 107  CO2 13 - 22 27(A) 26(A) 27(A)  Calcium 8.7 - 10.7 8.9 9.3 8.7  Total Protein 6.0 - 8.5 g/dL - - -  Total Bilirubin 0.0 - 1.2 mg/dL - - -  Alkaline Phos 25 - 125 74 82 -  AST 14 - 40 22 25 -  ALT 10 - 40 13 17 -     No results found for: CEA1 / No results found for: CEA1 Lab Results  Component Value Date   PSA1 0.2 03/31/2021   No results found for: [id number] No results found for: [id number]  No results found for: TOTALPROTELP, ALBUMINELP, A1GS, A2GS, BETS, BETA2SER, GAMS, MSPIKE, SPEI Lab Results  Component Value Date   TIBC 337 07/06/2018   FERRITIN 22 (L) 07/06/2018   IRONPCTSAT 18 07/06/2018   No results found for: LDH  STUDIES:  No results found.  Exam(s): [id number] CT/CT CHEST-ABD-PELV W/O IV CM CLINICAL DATA:  Known metastatic prostate cancer, nausea and vomiting, low back pain for several days  EXAM: CT CHEST, ABDOMEN AND PELVIS WITHOUT CONTRAST  TECHNIQUE: Multidetector CT imaging of the chest, abdomen and pelvis was performed following the standard protocol without IV contrast. Unenhanced CT was performed per clinician order. Lack of IV contrast limits sensitivity and specificity, especially for evaluation of abdominal/pelvic solid viscera.  COMPARISON:  09/28/2020, 01/13/2020  FINDINGS: CT CHEST FINDINGS  Cardiovascular: Limited unenhanced imaging of the heart and great vessels demonstrates no pericardial effusion. Normal caliber of the thoracic aorta. There is diffuse atherosclerosis of the aorta and coronary vessels, with evidence of prior CABG.  Mediastinum/Nodes: No enlarged mediastinal, hilar, or axillary lymph nodes. Thyroid gland, trachea, and esophagus demonstrate no significant findings.  Lungs/Pleura: Upper lobe predominant emphysema. No airspace disease, effusion, or pneumothorax. Central airways are  patent.  Musculoskeletal: Sclerotic lesion within the right posterolateral seventh rib is unchanged. Stable sclerosis within the left posterior sixth rib at the costovertebral junction. Punctate sclerotic foci  within the lower thoracic spine are stable. No new bony lesions. No acute fractures. Reconstructed images demonstrate no additional findings.  CT ABDOMEN PELVIS FINDINGS  Hepatobiliary: No focal liver abnormality is seen. No gallstones, gallbladder wall thickening, or biliary dilatation.  Pancreas: Unremarkable. No pancreatic ductal dilatation or surrounding inflammatory changes.  Spleen: Normal in size without focal abnormality.  Adrenals/Urinary Tract: No urinary tract calculi or obstructive uropathy. The adrenals and bladder are unremarkable.  Stomach/Bowel: No bowel obstruction or ileus. Prominent sigmoid diverticulosis without diverticulitis. The appendix is likely surgically absent. No bowel wall thickening or inflammatory change.  Vascular/Lymphatic: Aortic atherosclerosis. No enlarged abdominal or pelvic lymph nodes.  Reproductive: Prostate is not enlarged. Central calcifications unchanged.  Other: No free fluid or free gas.  No abdominal wall hernia.  Musculoskeletal: Sclerotic focus within the left L3 pedicle consistent with known bony metastasis. This was seen on previous MRI and bone scan. Punctate sclerotic focus within the L2 vertebral body again noted, corresponding to previous MRI findings. Numerous other punctate sclerotic lesions have developed within the L1 through L4 vertebral bodies, as well as within the left acetabulum and right superior ramus, consistent with progressive bony metastases.  Chronic ankylosis of the right SI joint. Stable right hip arthroplasty. No acute displaced fractures. Stable postsurgical changes at the lumbosacral junction. Reconstructed images demonstrate no additional findings.  IMPRESSION: 1. Bony metastatic lesions  within the thoracic cage and thoracolumbar spine as above. Lesions within the right seventh rib, left sixth rib, and L3 pedicle are unchanged. Numerous other punctate sclerotic foci have developed throughout the thoracolumbar spine and pelvis as above, new since 2021, consistent with bony metastases. These are likely too small for detection by bone scan. 2. No acute displaced fracture. Stable lower lumbar discectomy and posterior fusion. 3. Sigmoid diverticulosis without diverticulitis. 4. Otherwise no acute intrathoracic, intra-abdominal, or intrapelvic process to explain the patient's complaint of nausea and vomiting. 5. Aortic Atherosclerosis (ICD10-I70.0) and Emphysema (ICD10-J43.9).   Electronically Signed   By: Randa Ngo M.D.   On: 04/17/2021 18:28  Electronically Signed By: Soledad Gerlach MD  Electronically Signed Date/Time: 09/12/221831 Dictate Date/Time: 04/17/21 1814  Exam(s): [id number] CT/CT HEAD W/O CM CLINICAL DATA:  Intermittent double vision  EXAM: CT HEAD WITHOUT CONTRAST  TECHNIQUE: Contiguous axial images were obtained from the base of the skull through the vertex without intravenous contrast.  COMPARISON:  02/09/2018 CT head, correlation is also made with 02/18/2020 MRI brain  FINDINGS: Brain: No evidence of acute infarction, hemorrhage, hydrocephalus, extra-axial collection or mass lesion/mass effect. Remote cerebellar infarcts.  Vascular: No hyperdense vessel or unexpected calcification.  Skull: Normal. Negative for fracture or focal lesion.  Sinuses/Orbits: No acute finding.  Other: Redemonstrated calcified lesions in the scalp, most likely Pilar cysts.  IMPRESSION: No acute intracranial process.   Electronically Signed   By: Merilyn Baba M.D.   On: 04/17/2021 18:26  Electronically Signed By: Lillia Carmel MD  Electronically Signed Date/Time: 09/12/221829 Dictate Date/Time: 04/17/21 1822 HISTORY:   Past Medical History:   Diagnosis Date   Arthritis    Asthma    Cancer [facility])    history of prostate cancer- cryoablation   Cerebrovascular disease    COPD (chronic obstructive pulmonary disease) ([place])    Coronary artery disease    a. 2008 s/p CABG x 3 [facility]): LIMA->OM, RIMA->Diag, VG->RPDA;  b. 05/2016 NSTEMI/PCI: DES ->prox LAD and ost diag;  c. 12/2016 Cath/PCI: LM 95d, LAD 95ost (2.75x8 Promus Premier DES), patent prox stent, D2 30,  LCX nl, OM2 95, RCA min irregs, LIMA->OM2 ok, RIMA->D2 85 "kink"-improved w/ IC NTG, VG->RPDA 100.   Dyspnea    GERD (gastroesophageal reflux disease)    Hyperlipidemia    Hypertension    Malignant neoplasm of prostate ([place])    Mitral regurgitation    a. mod by cath 12/2016.   Statin intolerance    Stroke [facility])    Syncope 02/2017   Tobacco abuse     Past Surgical History:  Procedure Laterality Date   ABDOMINAL AORTOGRAM N/A 12/26/2016   Procedure: Abdominal Aortogram;  Surgeon: Troy Sine, MD;  Location: [place] CV LAB;  Service: Cardiovascular;  Laterality: N/A;   BACK SURGERY  2009   CORONARY ANGIOGRAPHY N/A 12/28/2016   Procedure: Coronary Angiography;  Surgeon: Burnell Blanks, MD;  Location: [place] CV LAB;  Service: Cardiovascular;  Laterality: N/A;   CORONARY ARTERY BYPASS GRAFT  2008   CORONARY ATHERECTOMY N/A 12/28/2016   Procedure: Coronary Atherectomy;  Surgeon: Burnell Blanks, MD;  Location: [place] CV LAB;  Service: Cardiovascular;  Laterality: N/A;   CORONARY STENT INTERVENTION N/A 12/28/2016   Procedure: Coronary Stent Intervention;  Surgeon: Burnell Blanks, MD;  Location: [place] CV LAB;  Service: Cardiovascular;  Laterality: N/A;   LEFT HEART CATH Left 05/07/2016   PR CATH PLACE/CORON ANGIO, IMG SUPER/TERP, W  [facility] CATH; SEVICE: CARDIOLOGY   LEFT HEART CATH AND CORS/GRAFTS ANGIOGRAPHY N/A 12/26/2016   Procedure: Left Heart Cath and Cors/Grafts Angiography;  Surgeon: Troy Sine, MD;  Location: [place] CV LAB;  Service: Cardiovascular;  Laterality: N/A;   LEFT HEART CATH AND CORS/GRAFTS ANGIOGRAPHY N/A 11/26/2017   Procedure: LEFT HEART CATH AND CORS/GRAFTS ANGIOGRAPHY;  Surgeon: Martinique, Peter M, MD;  Location: [place] CV LAB;  Service: Cardiovascular;  Laterality: N/A;   LEFT HEART CATH AND CORS/GRAFTS ANGIOGRAPHY N/A 07/07/2018   Procedure: LEFT HEART CATH AND CORS/GRAFTS ANGIOGRAPHY;  Surgeon: Nelva Bush, MD;  Location: [place] CV LAB;  Service: Cardiovascular;  Laterality: N/A;   LEFT HEART VENTRICULOGRAPHY Left 05/14/2016   PR CATH PLACE/ CORON ANGIO, IMG SUPER/ INTERP, W    TOTAL HIP ARTHROPLASTY Right 2013    Family History  Problem Relation Age of Onset   Kidney failure Mother    Hypertension Sister    Hypertension Brother    Hypertension Brother     Social History:  reports that he has quit smoking. His smoking use included cigarettes. He has a 14.00 pack-year smoking history. He has never used smokeless tobacco. He reports that he does not currently use drugs. He reports that he does not drink alcohol.The patient is accompanied by wife today.  Allergies:  Allergies  Allergen Reactions   Ciprofloxacin Hcl Nausea Only    Wife reported   Codeine Nausea And Vomiting and Other (See Comments)    Other reaction(s): GI Upset (intolerance) Pt not sure   Hydrocodone-Acetaminophen Other (See Comments)    Other reaction(s): GI Upset (intolerance)   Hydrocortisone Nausea And Vomiting   Lisinopril     Wife reported   Meclizine     Wife reported   Metronidazole Nausea Only    Wife reported   Pantoprazole Nausea Only    GI upset   Ranitidine     Wife reported   Simvastatin Nausea And Vomiting and Other (See Comments)    Other reaction(s): GI Upset (intolerance)   Influenza Vaccines Nausea And Vomiting    Current Medications: Current Outpatient Medications  Medication Sig Dispense Refill   morphine (MSIR) 30 MG tablet Take 1 tablet (30 mg total) by  mouth every 4 (four) hours as needed for severe pain. 90 tablet 0   albuterol (PROVENTIL HFA;VENTOLIN HFA) 108 (90 Base) MCG/ACT inhaler Inhale 1 puff into the lungs every 6 (six) hours as needed.     aspirin 81 MG tablet Take 1 tablet (81 mg total) by mouth daily. 30 tablet 0   atorvastatin (LIPITOR) 40 MG tablet Take 0.5 tablets (20 mg total) by mouth daily at 6 PM. 30 tablet 0   budesonide-formoterol (SYMBICORT) 80-4.5 MCG/ACT inhaler Inhale 2 puffs into the lungs daily. 1 Inhaler 12   clopidogrel (PLAVIX) 75 MG tablet Take 1 tablet (75 mg total) by mouth daily. 30 tablet 0   enzalutamide (XTANDI) 40 MG tablet Take 120 mg by mouth daily.     ezetimibe (ZETIA) 10 MG tablet TAKE 1 TABLET BY MOUTH ONCE (1) DAILY 30 tablet 6   fentaNYL (DURAGESIC) 75 MCG/HR Place 1 patch onto the skin every 3 (three) days. 10 patch 0   ferrous sulfate 325 (65 FE) MG tablet Take 1 tablet (325 mg total) by mouth daily with breakfast. 30 tablet 0   isosorbide mononitrate (IMDUR) 30 MG 24 hr tablet Take 1 tablet (30 mg total) by mouth daily. KEEP OV. 90 tablet 0   leuprolide (LUPRON) 22.5 MG injection Inject 22.5 mg into the muscle every 3 (three) months.     LORazepam (ATIVAN) 0.5 MG tablet Take 1 tablet (0.5 mg total) by mouth every 8 (eight) hours. 30 tablet 0   losartan (COZAAR) 25 MG tablet Take 1 tablet (25 mg total) by mouth daily. 30 tablet 0   metoprolol tartrate (LOPRESSOR) 25 MG tablet Take 1 tablet (25 mg total) by mouth 2 (two) times daily. 60 tablet 0   nitroGLYCERIN (NITROSTAT) 0.4 MG SL tablet Place 1 tablet (0.4 mg total) under the tongue as needed for chest pain. X 3 doses 25 tablet 3   ondansetron (ZOFRAN-ODT) 8 MG disintegrating tablet Take 1 tablet (8 mg total) by mouth every 8 (eight) hours as needed for nausea or vomiting. 20 tablet 0   prochlorperazine (COMPAZINE) 10 MG tablet Take 10 mg by mouth every 6 (six) hours as needed for nausea or vomiting.     zolendronic acid 4 mg in sodium chloride  0.9 % 100 mL Inject 4 mg into the vein once.     zolpidem (AMBIEN) 10 MG tablet Take 1 tablet (10 mg total) by mouth at bedtime as needed for sleep. 30 tablet 0   No current facility-administered medications for this visit.

## 2021-04-19 NOTE — Assessment & Plan Note (Signed)
He will continue treatment with Xtandi and Lupron as his PSA remains detectable, but low. He will continue follow up with Dr. Bobby Rumpf as scheduled.

## 2021-04-19 NOTE — Patient Instructions (Signed)

## 2021-04-19 NOTE — Assessment & Plan Note (Addendum)
CT imaging from ED reveals increasing bony metastasis. We will refer to [facility] for possible Xofigo treatment. He will continue with zometa in a couple of weeks as scheduled. We will give IVF today with IV nausea and pain meds to hopefully make him more comfortable. He will increase his morphine to 30 mg every 4 -6 hours as needed.

## 2021-04-19 NOTE — Addendum Note (Signed)
Addended by: Juanetta Beets on: 04/19/2021 01:13 PM   Modules accepted: Orders

## 2021-04-19 NOTE — Progress Notes (Signed)
1431: Patient continues in pain states no relief at all. Melissa NP orders to repeat dose and send him home to take his next dose of morphine. Discussed with patient and wife they agree with plan. Will go to ER if it worsens tonight. 1454: PT STABLE AT TIME OF DISCHARGE

## 2021-04-20 ENCOUNTER — Other Ambulatory Visit: Payer: Self-pay | Admitting: Hematology and Oncology

## 2021-04-20 ENCOUNTER — Telehealth: Payer: Self-pay

## 2021-04-20 MED ORDER — LORAZEPAM 0.5 MG PO TABS: 1.0000 mg | ORAL_TABLET | Freq: Three times a day (TID) | ORAL | 0 refills | Status: DC | PRN

## 2021-04-20 NOTE — Telephone Encounter (Signed)
Per Almira Bar, NP.  Pt needs to try the ativan again for nausea.  He had told Melissa that it didn't work, however he had only tried one time.  Spouse said that they didn't have any at the home so [facility] sent in a prescription.Marland Kitchen

## 2021-04-21 ENCOUNTER — Other Ambulatory Visit: Payer: Self-pay | Admitting: Oncology

## 2021-04-21 ENCOUNTER — Other Ambulatory Visit: Payer: Self-pay | Admitting: Hematology and Oncology

## 2021-04-28 ENCOUNTER — Other Ambulatory Visit: Payer: Medicare Other

## 2021-04-28 ENCOUNTER — Inpatient Hospital Stay: Payer: Medicare Other

## 2021-05-01 ENCOUNTER — Inpatient Hospital Stay: Payer: Medicare Other

## 2021-05-01 NOTE — Telephone Encounter (Unsigned)
Calling to remind patient of appt. Today at infusion center

## 2021-05-11 NOTE — Progress Notes (Signed)
[place]  [address] [place],  [place]  [address] [phone]  Clinic Day:  05/12/2021  Referring physician: [name], MD  This document serves as a record of services personally performed by Aleyah Balik Macarthur Critchley, MD. It was created on their behalf by [facility] E, a trained medical scribe. The creation of this record is based on the scribe's personal observations and the provider's statements to them.  HISTORY OF PRESENT ILLNESS:  The patient is a 70 y.o. male with metastatic prostate cancer, which includes spread of disease to his bones and retroperitoneal lymph nodes.  He is currently supposed to be on Lupron/enzalutamide for his disease management.  However, his wife brings to our attention that he has not taken his enzalutamide in months.  The patient claims he did not like the way it made him feel.  He also complains of persistent bone pain.  However, this gentleman took himself off of his Duragesic patch because he could not feel it working.  He had been told on multiple occasions this patch is used for long-acting pain control.  Of note, he continues to take his morphine immediate release tablets for pain relief.   to keep his disease under control.  He also previously underwent palliative radiation to his L3 vertebral body to treat the pain associated with his metastatic bone disease.  As there was persistent lower back pain, he underwent another course of palliative radiation. He comes in today still complaining of lower back pain, wanting to know what else can be done to address it.      PHYSICAL EXAM:  Blood pressure (!) 212/93, pulse 81, temperature 98.3 F (36.8 C), resp. rate 18, height 5\' 11"  (1.803 m), weight 197 lb 3.2 oz (89.4 kg), SpO2 99 %. Wt Readings from Last 3 Encounters:  05/12/21 197 lb 3.2 oz (89.4 kg)  04/19/21 198 lb 4 oz (89.9 kg)  04/19/21 198 lb 3.2 oz (89.9 kg)   Body mass index is 27.5 kg/m. Performance status  (ECOG): 1 Physical Exam Constitutional:      General: He is not in acute distress.    Appearance: Normal appearance. He is normal weight.  HENT:     Head: Normocephalic and atraumatic.  Eyes:     General: No scleral icterus.    Extraocular Movements: Extraocular movements intact.     Conjunctiva/sclera: Conjunctivae normal.     Pupils: Pupils are equal, round, and reactive to light.  Cardiovascular:     Rate and Rhythm: Normal rate and regular rhythm.     Pulses: Normal pulses.     Heart sounds: Normal heart sounds. No murmur heard.   No friction rub. No gallop.  Pulmonary:     Effort: Pulmonary effort is normal. No respiratory distress.     Breath sounds: Normal breath sounds.  Abdominal:     General: Bowel sounds are normal. There is no distension.     Palpations: Abdomen is soft. There is no hepatomegaly, splenomegaly or mass.     Tenderness: There is no abdominal tenderness.  Musculoskeletal:        General: Normal range of motion.     Cervical back: Normal range of motion and neck supple.     Right lower leg: No edema.     Left lower leg: No edema.  Lymphadenopathy:     Cervical: No cervical adenopathy.  Skin:    General: Skin is warm and dry.  Neurological:  General: No focal deficit present.     Mental Status: He is alert and oriented to person, place, and time. Mental status is at baseline.  Psychiatric:        Mood and Affect: Mood normal.        Behavior: Behavior normal.        Thought Content: Thought content normal.        Judgment: Judgment normal.   LABS:   Component Ref Range & Units 04/19/21 3 d ago  (03/31/21) 3 mo ago  (12/29/20) 4 mo ago  (11/30/20) 6 mo ago  (10/05/20) 6 mo ago  (09/21/20) 8 mo ago  (07/20/20) 9 mo ago  (06/24/20)  Prostate Specific Ag, Serum 0.0 - 4.0 ng/mL 0.1 0.2  0.2 CM  0.2 CM  0.2 CM  0.1 CM  <0.1 CM  NSGEL     ASSESSMENT & PLAN:  Assessment/Plan:  A 70 y.o. male with metastatic prostate cancer.  As this gentleman still  complains of pain despite having past pain regimens and 2 episodes of palliative radiation, I will have this gentleman seen by a local pain clinic.  Overall, I have not been particularly pleased with how this patient has approached his cancer management.  From not taking his pills for prostate cancer to stopping his Duragesic patches without communicating with [place] that he has done this, he has been very difficult to manage.  His wife also states he does not take his blood pressure medication as prescribed by his primary care provider despite consistently having a systolic blood pressure at/above 200.  I made it very clear to the patient today that his overall approach to his health is unsatisfactory and how he should not be surprised if an acute life-altering event occurs in his future due to his noncompliance with many of his health issues.  As he complains of headaches and chest discomfort, I will arrange for CT scans of his head and chest to be done.  My primary concern is he may have lung cancer, particularly as he has been a chronically heavy smoker.  I will see him back in 1 week to go over these scans and their implications.  The patient understands all the plans discussed today and is in agreement with them.   I, Rita Ohara, am acting as scribe for Marice Potter, MD    I have reviewed this report as typed by the medical scribe, and it is complete and accurate.  Letetia Romanello Macarthur Critchley, MD

## 2021-05-12 ENCOUNTER — Inpatient Hospital Stay: Payer: Medicare Other | Attending: Oncology | Admitting: Oncology

## 2021-05-12 ENCOUNTER — Inpatient Hospital Stay: Payer: Medicare Other

## 2021-05-12 ENCOUNTER — Encounter: Payer: Self-pay | Admitting: Oncology

## 2021-05-12 ENCOUNTER — Other Ambulatory Visit: Payer: Self-pay | Admitting: Oncology

## 2021-05-12 VITALS — BP 212/93 | HR 81 | Temp 98.3°F | Resp 18 | Ht 71.0 in | Wt 197.2 lb

## 2021-05-12 DIAGNOSIS — C61 Malignant neoplasm of prostate: Secondary | ICD-10-CM

## 2021-05-12 DIAGNOSIS — Z79899 Other long term (current) drug therapy: Secondary | ICD-10-CM | POA: Insufficient documentation

## 2021-05-12 DIAGNOSIS — C7952 Secondary malignant neoplasm of bone marrow: Secondary | ICD-10-CM

## 2021-05-12 DIAGNOSIS — C7951 Secondary malignant neoplasm of bone: Secondary | ICD-10-CM | POA: Diagnosis not present

## 2021-05-12 DIAGNOSIS — R0782 Intercostal pain: Secondary | ICD-10-CM

## 2021-05-17 ENCOUNTER — Encounter: Payer: Self-pay | Admitting: Oncology

## 2021-05-18 ENCOUNTER — Encounter: Payer: Self-pay | Admitting: Oncology

## 2021-05-18 DIAGNOSIS — J439 Emphysema, unspecified: Secondary | ICD-10-CM | POA: Diagnosis not present

## 2021-05-18 DIAGNOSIS — I6381 Other cerebral infarction due to occlusion or stenosis of small artery: Secondary | ICD-10-CM | POA: Diagnosis not present

## 2021-05-18 DIAGNOSIS — I6782 Cerebral ischemia: Secondary | ICD-10-CM | POA: Diagnosis not present

## 2021-05-18 DIAGNOSIS — G319 Degenerative disease of nervous system, unspecified: Secondary | ICD-10-CM | POA: Diagnosis not present

## 2021-05-18 DIAGNOSIS — J3489 Other specified disorders of nose and nasal sinuses: Secondary | ICD-10-CM | POA: Diagnosis not present

## 2021-05-18 DIAGNOSIS — R519 Headache, unspecified: Secondary | ICD-10-CM | POA: Diagnosis not present

## 2021-05-18 DIAGNOSIS — I700 Atherosclerosis of aorta: Secondary | ICD-10-CM | POA: Diagnosis not present

## 2021-05-18 NOTE — Progress Notes (Signed)
[place]  [address] [place],  [place]  [address] [phone]  Clinic Day:  05/21/2021  Referring physician: [name], MD  This document serves as a record of services personally performed by Tyreka Henneke Macarthur Critchley, MD. It was created on their behalf by [facility] E, a trained medical scribe. The creation of this record is based on the scribe's personal observations and the provider's statements to them.  HISTORY OF PRESENT ILLNESS:  The patient is a 70 y.o. male with metastatic prostate cancer, which includes spread of disease to his bones and retroperitoneal lymph nodes.  However, he comes in today to go over his head and chest CT scans, which were done as he has had worsening headaches and progressive shortness of breath.  Of note, he is a long time smoker.  Furthermore, he admits he has not been taking his blood pressure medication for months despite his systolic blood pressure being above 200.  With respect to his metastatic prostate cancer, he is supposed to be on Lupron/enzalutamide for his disease management.  However, he has not taken his enzalutamide in months.  Compliance with therapy has been a major problem with this gentleman.  However, he claims he has gotten back to taking his blood pressure medication after realizing his headaches may have been due to his poorly controlled blood pressure.    PHYSICAL EXAM:  Blood pressure (!) 183/84, pulse 73, temperature 97.8 F (36.6 C), resp. rate 18, height 5\' 11"  (1.803 m), weight 195 lb 4.8 oz (88.6 kg), SpO2 100 %. Wt Readings from Last 3 Encounters:  05/19/21 196 lb (88.9 kg)  05/19/21 195 lb 4.8 oz (88.6 kg)  05/12/21 197 lb 3.2 oz (89.4 kg)   Body mass index is 27.24 kg/m. Performance status (ECOG): 1 Physical Exam Constitutional:      General: He is not in acute distress.    Appearance: Normal appearance. He is normal weight.  HENT:     Head: Normocephalic and atraumatic.  Eyes:      General: No scleral icterus.    Extraocular Movements: Extraocular movements intact.     Conjunctiva/sclera: Conjunctivae normal.     Pupils: Pupils are equal, round, and reactive to light.  Cardiovascular:     Rate and Rhythm: Normal rate and regular rhythm.     Pulses: Normal pulses.     Heart sounds: Normal heart sounds. No murmur heard.   No friction rub. No gallop.  Pulmonary:     Effort: Pulmonary effort is normal. No respiratory distress.     Breath sounds: Normal breath sounds.  Abdominal:     General: Bowel sounds are normal. There is no distension.     Palpations: Abdomen is soft. There is no hepatomegaly, splenomegaly or mass.     Tenderness: There is no abdominal tenderness.  Musculoskeletal:        General: Normal range of motion.     Cervical back: Normal range of motion and neck supple.     Right lower leg: No edema.     Left lower leg: No edema.  Lymphadenopathy:     Cervical: No cervical adenopathy.  Skin:    General: Skin is warm and dry.  Neurological:     General: No focal deficit present.     Mental Status: He is alert and oriented to person, place, and time. Mental status is at baseline.  Psychiatric:        Mood and Affect: Mood normal.  Behavior: Behavior normal.        Thought Content: Thought content normal.        Judgment: Judgment normal.   SCANS: Recent CT head and chest have revealed the following: FINDINGS:  Brain: Remote lacunar infarct in the left basal ganglia and  bilateral cerebellar hemispheres. No evidence of acute large  vascular territory infarct, acute hemorrhage, hydrocephalus, midline  shift, or extra-axial fluid collection. No enhancing lesions  identified on postcontrast imaging. Similar generalized atrophy  chronic microvascular ischemic disease.  Vascular: No hyperdense vessel or unexpected calcification. Visible  vessels are patent.  Skull: Redemonstrated chronic calcified scalp lesions, potentially  pilar cysts. No  acute fracture or destructive bone lesions.  Sinuses/Orbits: Minimal paranasal sinus mucosal thickening. No acute  findings in the visualized orbits.  Other: No mastoid effusions.   IMPRESSION:  No evidence of acute intracranial abnormality or intracranial  metastatic disease by CT.  -------------------------------------------------------------------------------   FINDINGS:  Cardiovascular: Normal heart size. Three-vessel coronary artery  calcifications which probable LAD stent. Atherosclerotic disease of  the thoracic aorta.  Mediastinum/Nodes: No pathologically enlarged lymph nodes seen in  the chest. Wall thickening of the distal esophagus. Thyroid is  unremarkable.  Lungs/Pleura: Central airways are patent. Upper lobe predominant  centrilobular and paraseptal emphysema. No consolidation, pleural  effusion or pneumothorax. No new or enlarging pulmonary nodules.  Upper Abdomen: No acute abnormality.  Musculoskeletal: No acute osseous abnormality. Lytic and sclerotic  lesions of the sternum. Scattered small sclerotic lesions seen  throughout the thoracic spine and bilateral ribs, unchanged compared  to prior exam.   IMPRESSION:  No CT findings to explain upper right flank pain.  Mild circumferential wall thickening in the lower thoracic  esophagus, most commonly due to reflux esophagitis. If there is  clinical concern for Barrett's esophagus or esophageal neoplasm, GI  referral for upper endoscopic correlation is recommended.   Unchanged osseous metastatic disease.  Aortic Atherosclerosis (ICD10-I70.0) and Emphysema (ICD10-J43.9).   ASSESSMENT & PLAN:  Assessment/Plan:  A 70 y.o. male with metastatic prostate cancer.  In clinic today, I went over all of his CT scan images with him for which no cancer or other abnormalities were appreciated.  Most of this visit was spent reinforcing the importance of being compliant with therapy, both cancer and non-cancer related, to ensure his  health is being best maintained.  Although I am not pleased with him telling us that he stopped his enzalutamide multiple months ago, as his most recent PSA was only 0.1, he will be maintained on just androgen deprivation with Lupron.  He will receive that shot today, which he receives every 3 months, as well as continue to receive Zometa monthly for bone protection against metastatic disease. I will see him back in 3 months for repeat clinical assessment.  The patient understands all the plans discussed today and is in agreement with them.   I, Rita Ohara, am acting as scribe for Marice Potter, MD    I have reviewed this report as typed by the medical scribe, and it is complete and accurate.  Corvin Sorbo Macarthur Critchley, MD

## 2021-05-19 ENCOUNTER — Other Ambulatory Visit: Payer: Self-pay

## 2021-05-19 ENCOUNTER — Inpatient Hospital Stay (INDEPENDENT_AMBULATORY_CARE_PROVIDER_SITE_OTHER): Payer: Medicare Other | Admitting: Oncology

## 2021-05-19 ENCOUNTER — Inpatient Hospital Stay: Payer: Medicare Other

## 2021-05-19 ENCOUNTER — Other Ambulatory Visit: Payer: Self-pay | Admitting: Oncology

## 2021-05-19 VITALS — BP 183/84 | HR 73 | Temp 97.8°F | Resp 18 | Ht 71.0 in | Wt 195.3 lb

## 2021-05-19 VITALS — BP 178/84 | HR 74 | Temp 98.1°F | Resp 18 | Ht 71.0 in | Wt 196.0 lb

## 2021-05-19 DIAGNOSIS — C61 Malignant neoplasm of prostate: Secondary | ICD-10-CM

## 2021-05-19 DIAGNOSIS — C7951 Secondary malignant neoplasm of bone: Secondary | ICD-10-CM

## 2021-05-19 DIAGNOSIS — Z79899 Other long term (current) drug therapy: Secondary | ICD-10-CM | POA: Diagnosis not present

## 2021-05-19 DIAGNOSIS — C7952 Secondary malignant neoplasm of bone marrow: Secondary | ICD-10-CM | POA: Diagnosis not present

## 2021-05-19 MED ORDER — SODIUM CHLORIDE 0.9 % IV SOLN: Freq: Once | INTRAVENOUS | Status: AC

## 2021-05-19 MED ORDER — LEUPROLIDE ACETATE (3 MONTH) 22.5 MG IM KIT
22.5000 mg | PACK | Freq: Once | INTRAMUSCULAR | Status: AC
Start: 2021-05-19 — End: 2021-05-19
  Administered 2021-05-19: 22.5 mg via INTRAMUSCULAR
  Filled 2021-05-19: qty 22.5

## 2021-05-19 MED ORDER — ZOLEDRONIC ACID 4 MG/100ML IV SOLN
4.0000 mg | Freq: Once | INTRAVENOUS | Status: AC
  Administered 2021-05-19: 4 mg via INTRAVENOUS
  Filled 2021-05-19: qty 100

## 2021-05-19 NOTE — Patient Instructions (Signed)
Zoledronic Acid Injection (Hypercalcemia, Oncology) What is this medication? ZOLEDRONIC ACID (ZOE le dron ik AS id) slows calcium loss from bones. It high calcium levels in the blood from some kinds of cancer. It may be used in other people at risk for bone loss. This medicine may be used for other purposes; ask your health care provider or pharmacist if you have questions. COMMON BRAND NAME(S): Zometa What should I tell my care team before I take this medication? They need to know if you have any of these conditions: cancer dehydration dental disease kidney disease liver disease low levels of calcium in the blood lung or breathing disease (asthma) receiving steroids like dexamethasone or prednisone an unusual or allergic reaction to zoledronic acid, other medicines, foods, dyes, or preservatives pregnant or trying to get pregnant breast-feeding How should I use this medication? This drug is injected into a vein. It is given by a health care provider in a hospital or clinic setting. Talk to your health care provider about the use of this drug in children. Special care may be needed. Overdosage: If you think you have taken too much of this medicine contact a poison control center or emergency room at once. NOTE: This medicine is only for you. Do not share this medicine with others. What if I miss a dose? Keep appointments for follow-up doses. It is important not to miss your dose. Call your health care provider if you are unable to keep an appointment. What may interact with this medication? certain antibiotics given by injection NSAIDs, medicines for pain and inflammation, like ibuprofen or naproxen some diuretics like bumetanide, furosemide teriparatide thalidomide This list may not describe all possible interactions. Give your health care provider a list of all the medicines, herbs, non-prescription drugs, or dietary supplements you use. Also tell them if you smoke, drink alcohol, or  use illegal drugs. Some items may interact with your medicine. What should I watch for while using this medication? Visit your health care provider for regular checks on your progress. It may be some time before you see the benefit from this drug. Some people who take this drug have severe bone, joint, or muscle pain. This drug may also increase your risk for jaw problems or a broken thigh bone. Tell your health care provider right away if you have severe pain in your jaw, bones, joints, or muscles. Tell you health care provider if you have any pain that does not go away or that gets worse. Tell your dentist and dental surgeon that you are taking this drug. You should not have major dental surgery while on this drug. See your dentist to have a dental exam and fix any dental problems before starting this drug. Take good care of your teeth while on this drug. Make sure you see your dentist for regular follow-up appointments. You should make sure you get enough calcium and vitamin D while you are taking this drug. Discuss the foods you eat and the vitamins you take with your health care provider. Check with your health care provider if you have severe diarrhea, nausea, and vomiting, or if you sweat a lot. The loss of too much body fluid may make it dangerous for you to take this drug. You may need blood work done while you are taking this drug. Do not become pregnant while taking this drug. Women should inform their health care provider if they wish to become pregnant or think they might be pregnant. There is potential for serious  harm to an unborn child. Talk to your health care provider for more information. What side effects may I notice from receiving this medication? Side effects that you should report to your doctor or health care provider as soon as possible: allergic reactions (skin rash, itching or hives; swelling of the face, lips, or tongue) bone pain infection (fever, chills, cough, sore  throat, pain or trouble passing urine) jaw pain, especially after dental work joint pain kidney injury (trouble passing urine or change in the amount of urine) low blood pressure (dizziness; feeling faint or lightheaded, falls; unusually weak or tired) low calcium levels (fast heartbeat; muscle cramps or pain; pain, tingling, or numbness in the hands or feet; seizures) low magnesium levels (fast, irregular heartbeat; muscle cramp or pain; muscle weakness; tremors; seizures) low red blood cell counts (trouble breathing; feeling faint; lightheaded, falls; unusually weak or tired) muscle pain redness, blistering, peeling, or loosening of the skin, including inside the mouth severe diarrhea swelling of the ankles, feet, hands trouble breathing Side effects that usually do not require medical attention (report to your doctor or health care provider if they continue or are bothersome): anxious constipation coughing depressed mood eye irritation, itching, or pain fever general ill feeling or flu-like symptoms nausea pain, redness, or irritation at site where injected trouble sleeping This list may not describe all possible side effects. Call your doctor for medical advice about side effects. You may report side effects to FDA at 1-800-FDA-1088. Where should I keep my medication? This drug is given in a hospital or clinic. It will not be stored at home. NOTE: This sheet is a summary. It may not cover all possible information. If you have questions about this medicine, talk to your doctor, pharmacist, or health care provider.  2022 Elsevier/Gold Standard (2019-05-07 09:13:00) Leuprolide injection What is this medication? LEUPROLIDE (loo PROE lide) is a man-made hormone. It is used to treat the symptoms of prostate cancer. This medicine may also be used to treat children with early onset of puberty. It may be used for other hormonal conditions. This medicine may be used for other purposes; ask  your health care provider or pharmacist if you have questions. COMMON BRAND NAME(S): Lupron What should I tell my care team before I take this medication? They need to know if you have any of these conditions: diabetes heart disease or previous heart attack high blood pressure high cholesterol pain or difficulty passing urine spinal cord metastasis stroke tobacco smoker an unusual or allergic reaction to leuprolide, benzyl alcohol, other medicines, foods, dyes, or preservatives pregnant or trying to get pregnant breast-feeding How should I use this medication? This medicine is for injection under the skin or into a muscle. You will be taught how to prepare and give this medicine. Use exactly as directed. Take your medicine at regular intervals. Do not take your medicine more often than directed. It is important that you put your used needles and syringes in a special sharps container. Do not put them in a trash can. If you do not have a sharps container, call your pharmacist or healthcare provider to get one. A special MedGuide will be given to you by the pharmacist with each prescription and refill. Be sure to read this information carefully each time. Talk to your pediatrician regarding the use of this medicine in children. While this medicine may be prescribed for children as young as 8 years for selected conditions, precautions do apply. Overdosage: If you think you have taken too  much of this medicine contact a poison control center or emergency room at once. NOTE: This medicine is only for you. Do not share this medicine with others. What if I miss a dose? If you miss a dose, take it as soon as you can. If it is almost time for your next dose, take only that dose. Do not take double or extra doses. What may interact with this medication? Do not take this medicine with any of the following medications: chasteberry cisapride dronedarone pimozide thioridazine This medicine may also  interact with the following medications: herbal or dietary supplements, like black cohosh or DHEA male hormones, like estrogens or progestins and birth control pills, patches, rings, or injections male hormones, like testosterone other medicines that prolong the QT interval (abnormal heart rhythm) This list may not describe all possible interactions. Give your health care provider a list of all the medicines, herbs, non-prescription drugs, or dietary supplements you use. Also tell them if you smoke, drink alcohol, or use illegal drugs. Some items may interact with your medicine. What should I watch for while using this medication? Visit your doctor or health care professional for regular checks on your progress. During the first week, your symptoms may get worse, but then will improve as you continue your treatment. You may get hot flashes, increased bone pain, increased difficulty passing urine, or an aggravation of nerve symptoms. Discuss these effects with your doctor or health care professional, some of them may improve with continued use of this medicine. Male patients may experience a menstrual cycle or spotting during the first 2 months of therapy with this medicine. If this continues, contact your doctor or health care professional. This medicine may increase blood sugar. Ask your healthcare provider if changes in diet or medicines are needed if you have diabetes. What side effects may I notice from receiving this medication? Side effects that you should report to your doctor or health care professional as soon as possible: allergic reactions like skin rash, itching or hives, swelling of the face, lips, or tongue breathing problems chest pain depression or memory disorders pain in your legs or groin pain at site where injected severe headache signs and symptoms of high blood sugar such as being more thirsty or hungry or having to urinate more than normal. You may also feel very tired  or have blurry vision swelling of the feet and legs visual changes vomiting Side effects that usually do not require medical attention (report to your doctor or health care professional if they continue or are bothersome): breast swelling or tenderness decrease in sex drive or performance diarrhea hot flashes loss of appetite muscle, joint, or bone pains nausea redness or irritation at site where injected skin problems or acne This list may not describe all possible side effects. Call your doctor for medical advice about side effects. You may report side effects to FDA at 1-800-FDA-1088. Where should I keep my medication? Keep out of the reach of children. Store below 25 degrees C (77 degrees F). Do not freeze. Protect from light. Do not use if it is not clear or if there are particles present. Throw away any unused medicine after the expiration date. NOTE: This sheet is a summary. It may not cover all possible information. If you have questions about this medicine, talk to your doctor, pharmacist, or health care provider.  2022 Elsevier/Gold Standard (2019-06-24 10:57:41)

## 2021-05-21 ENCOUNTER — Encounter: Payer: Self-pay | Admitting: Oncology

## 2021-05-23 ENCOUNTER — Other Ambulatory Visit: Payer: Self-pay

## 2021-05-23 DIAGNOSIS — G8929 Other chronic pain: Secondary | ICD-10-CM

## 2021-05-23 DIAGNOSIS — C61 Malignant neoplasm of prostate: Secondary | ICD-10-CM

## 2021-05-23 DIAGNOSIS — C7951 Secondary malignant neoplasm of bone: Secondary | ICD-10-CM

## 2021-05-23 MED ORDER — MORPHINE SULFATE 30 MG PO TABS: 30.0000 mg | ORAL_TABLET | ORAL | 0 refills | Status: DC | PRN

## 2021-05-24 ENCOUNTER — Other Ambulatory Visit: Payer: Self-pay | Admitting: Hematology and Oncology

## 2021-05-24 ENCOUNTER — Telehealth: Payer: Self-pay

## 2021-05-24 DIAGNOSIS — C61 Malignant neoplasm of prostate: Secondary | ICD-10-CM

## 2021-05-24 DIAGNOSIS — M545 Low back pain, unspecified: Secondary | ICD-10-CM

## 2021-05-24 DIAGNOSIS — C7951 Secondary malignant neoplasm of bone: Secondary | ICD-10-CM

## 2021-05-24 DIAGNOSIS — G8929 Other chronic pain: Secondary | ICD-10-CM

## 2021-05-24 MED ORDER — MORPHINE SULFATE 15 MG PO TABS: 15.0000 mg | ORAL_TABLET | ORAL | 0 refills | Status: DC | PRN

## 2021-05-24 NOTE — Telephone Encounter (Addendum)
Melissa,NP: I'm sending the Morphine 15mg  now. [facility] is sending him to pain management.  Lamara Brecht,RN: No they have not dispensed the Morphine 30mg . The patient is still in the pharmacy.  Melissa,NP:   I wonder if the pills are easily halved? He could just take half a pill. I'll send the 15mg . The pharmacy hasn't dispensed the 30mg  have they?  Virgen Belland,RN:  I forwarded the message to [facility].

## 2021-06-01 ENCOUNTER — Other Ambulatory Visit: Payer: Self-pay | Admitting: Oncology

## 2021-06-01 ENCOUNTER — Other Ambulatory Visit: Payer: Self-pay | Admitting: Hematology and Oncology

## 2021-06-01 DIAGNOSIS — F411 Generalized anxiety disorder: Secondary | ICD-10-CM

## 2021-06-05 ENCOUNTER — Other Ambulatory Visit: Payer: Self-pay

## 2021-06-05 DIAGNOSIS — G8929 Other chronic pain: Secondary | ICD-10-CM

## 2021-06-05 DIAGNOSIS — C61 Malignant neoplasm of prostate: Secondary | ICD-10-CM

## 2021-06-05 DIAGNOSIS — C7951 Secondary malignant neoplasm of bone: Secondary | ICD-10-CM

## 2021-06-05 MED ORDER — MORPHINE SULFATE 15 MG PO TABS: 15.0000 mg | ORAL_TABLET | ORAL | 0 refills | Status: DC | PRN

## 2021-06-06 ENCOUNTER — Other Ambulatory Visit: Payer: Self-pay

## 2021-06-06 DIAGNOSIS — F411 Generalized anxiety disorder: Secondary | ICD-10-CM

## 2021-06-06 DIAGNOSIS — R112 Nausea with vomiting, unspecified: Secondary | ICD-10-CM

## 2021-06-06 MED ORDER — LORAZEPAM 0.5 MG PO TABS: 1.0000 mg | ORAL_TABLET | Freq: Three times a day (TID) | ORAL | 0 refills | Status: DC | PRN

## 2021-07-03 ENCOUNTER — Other Ambulatory Visit: Payer: Medicare Other

## 2021-07-04 ENCOUNTER — Ambulatory Visit: Payer: Medicare Other | Admitting: Oncology

## 2021-08-02 ENCOUNTER — Other Ambulatory Visit: Payer: Self-pay | Admitting: Hematology and Oncology

## 2021-08-02 DIAGNOSIS — M545 Low back pain, unspecified: Secondary | ICD-10-CM

## 2021-08-02 DIAGNOSIS — C61 Malignant neoplasm of prostate: Secondary | ICD-10-CM

## 2021-08-02 DIAGNOSIS — C7951 Secondary malignant neoplasm of bone: Secondary | ICD-10-CM

## 2021-08-04 ENCOUNTER — Encounter: Payer: Self-pay | Admitting: Oncology

## 2021-08-04 ENCOUNTER — Other Ambulatory Visit: Payer: Self-pay

## 2021-08-10 ENCOUNTER — Other Ambulatory Visit: Payer: Self-pay | Admitting: Pharmacist

## 2021-08-10 NOTE — Progress Notes (Signed)
error 

## 2021-08-14 ENCOUNTER — Encounter: Payer: Self-pay | Admitting: Oncology

## 2021-08-14 NOTE — Progress Notes (Signed)
°  [facility]  [address] [place],  [place]  [address] [phone]  Clinic Day:  08/18/2021  Referring physician: [name], MD  VIRTUAL VISIT  HISTORY OF PRESENT ILLNESS:  The patient is a 70 y.o. male with metastatic prostate cancer, which includes spread of disease to his bones and retroperitoneal lymph nodes.  He currently takes only Lupron for his androgen deprivation.  He comes in today for routine follow-up.  Since his last visit, the patient has been doing okay.  He denies having bone pain or other symptoms which concern him for progression of his metastatic prostate cancer.  Of note, he does complain of double vision, which been present for the last month and a half.  He claims he has not brought this to the attention of his primary care provider.  This gentleman has a long history of poorly controlled blood pressure, which may be factoring into this problem.  PHYSICAL EXAM:  Blood pressure (!) 192/89, pulse 79, temperature (!) 97.5 F (36.4 C), resp. rate 18, height 5\' 11"  (1.803 m), weight 203 lb (92.1 kg), SpO2 100 %. Wt Readings from Last 3 Encounters:  08/18/21 203 lb (92.1 kg)  05/19/21 196 lb (88.9 kg)  05/19/21 195 lb 4.8 oz (88.6 kg)   Body mass index is 28.31 kg/m. Performance status (ECOG): 1  Physical exam deferred  LABS:   ASSESSMENT & PLAN:  Assessment/Plan:  A 71 y.o. male with metastatic prostate cancer.  When evaluating this gentleman's labs today, this gentleman's PSA has risen over these past months.  He understands that complete adjuvant blockade therapy is what I usually recommend to treat his disease.  As he had problems with enzalutamide, he stopped this medicine on his own.  The patient understands that if his PSA continues to rise, I would consider adding another oral antiandrogen to his Lupron, such as either apalutamide or darolutamide.  Both of these antiandrogens tend to be less toxic than enzalutamide.  He  will receive his next Lupron shot today.  With respect to his double vision, he will speak with his primary care office about this.  I still have concerns that his poorly controlled blood pressure is the major reason factoring into his diplopia.  Otherwise, I will see the patient back in 3 months for repeat clinical assessment.  At that the patient understands all the plans discussed today and is in agreement with them.   I, Rita Ohara, am acting as scribe for Marice Potter, MD    I have reviewed this report as typed by the medical scribe, and it is complete and accurate.  Dequincy Macarthur Critchley, MD

## 2021-08-17 ENCOUNTER — Other Ambulatory Visit: Payer: Self-pay

## 2021-08-17 ENCOUNTER — Other Ambulatory Visit: Payer: Self-pay | Admitting: Hematology and Oncology

## 2021-08-17 ENCOUNTER — Inpatient Hospital Stay: Payer: Medicare Other | Attending: Oncology

## 2021-08-17 DIAGNOSIS — C7951 Secondary malignant neoplasm of bone: Secondary | ICD-10-CM | POA: Diagnosis not present

## 2021-08-17 DIAGNOSIS — D649 Anemia, unspecified: Secondary | ICD-10-CM | POA: Diagnosis not present

## 2021-08-17 DIAGNOSIS — C61 Malignant neoplasm of prostate: Secondary | ICD-10-CM | POA: Diagnosis present

## 2021-08-17 DIAGNOSIS — Z5111 Encounter for antineoplastic chemotherapy: Secondary | ICD-10-CM | POA: Insufficient documentation

## 2021-08-17 DIAGNOSIS — Z79899 Other long term (current) drug therapy: Secondary | ICD-10-CM | POA: Diagnosis not present

## 2021-08-17 LAB — BASIC METABOLIC PANEL
BUN: 11 (ref 4–21)
CO2: 21 (ref 13–22)
Chloride: 110 — AB (ref 99–108)
Creatinine: 1 (ref 0.6–1.3)
Glucose: 104
Potassium: 4.3 (ref 3.4–5.3)
Sodium: 140 (ref 137–147)

## 2021-08-17 LAB — CBC AND DIFFERENTIAL
HCT: 42 (ref 41–53)
Hemoglobin: 14.4 (ref 13.5–17.5)
Neutrophils Absolute: 4.56
Platelets: 265 (ref 150–399)
WBC: 6.8

## 2021-08-17 LAB — COMPREHENSIVE METABOLIC PANEL
Albumin: 4.4 (ref 3.5–5.0)
Calcium: 8.8 (ref 8.7–10.7)

## 2021-08-17 LAB — HEPATIC FUNCTION PANEL
ALT: 24 (ref 10–40)
AST: 25 (ref 14–40)
Alkaline Phosphatase: 94 (ref 25–125)
Bilirubin, Total: 0.6

## 2021-08-17 LAB — CBC: RBC: 4.65 (ref 3.87–5.11)

## 2021-08-17 LAB — PSA: Prostatic Specific Antigen: 0.25 ng/mL (ref 0.00–4.00)

## 2021-08-18 ENCOUNTER — Inpatient Hospital Stay: Payer: Medicare Other | Admitting: Oncology

## 2021-08-18 ENCOUNTER — Telehealth: Payer: Self-pay | Admitting: Oncology

## 2021-08-18 ENCOUNTER — Inpatient Hospital Stay: Payer: Medicare Other

## 2021-08-18 ENCOUNTER — Other Ambulatory Visit: Payer: Self-pay | Admitting: Oncology

## 2021-08-18 VITALS — BP 192/89 | HR 79 | Temp 97.5°F | Resp 18 | Ht 71.0 in | Wt 203.0 lb

## 2021-08-18 VITALS — BP 191/89 | Temp 97.5°F | Resp 2 | Ht 71.0 in | Wt 203.0 lb

## 2021-08-18 DIAGNOSIS — C7951 Secondary malignant neoplasm of bone: Secondary | ICD-10-CM | POA: Diagnosis not present

## 2021-08-18 DIAGNOSIS — C61 Malignant neoplasm of prostate: Secondary | ICD-10-CM

## 2021-08-18 DIAGNOSIS — Z5111 Encounter for antineoplastic chemotherapy: Secondary | ICD-10-CM | POA: Diagnosis not present

## 2021-08-18 DIAGNOSIS — Z79899 Other long term (current) drug therapy: Secondary | ICD-10-CM | POA: Diagnosis not present

## 2021-08-18 DIAGNOSIS — C7952 Secondary malignant neoplasm of bone marrow: Secondary | ICD-10-CM

## 2021-08-18 MED ORDER — LEUPROLIDE ACETATE (3 MONTH) 22.5 MG IM KIT
22.5000 mg | PACK | Freq: Once | INTRAMUSCULAR | Status: AC
  Administered 2021-08-18: 22.5 mg via INTRAMUSCULAR
  Filled 2021-08-18: qty 22.5

## 2021-08-18 MED ORDER — ZOLEDRONIC ACID 4 MG/100ML IV SOLN
4.0000 mg | Freq: Once | INTRAVENOUS | Status: AC
  Administered 2021-08-18: 4 mg via INTRAVENOUS
  Filled 2021-08-18: qty 100

## 2021-08-18 MED ORDER — SODIUM CHLORIDE 0.9 % IV SOLN: Freq: Once | INTRAVENOUS | Status: AC

## 2021-08-18 NOTE — Telephone Encounter (Signed)
Per 1/13 los next appt scheduled and confirmed with patient

## 2021-08-18 NOTE — Patient Instructions (Signed)
Zoledronic Acid Injection (Hypercalcemia, Oncology) What is this medication? ZOLEDRONIC ACID (ZOE le dron ik AS id) slows calcium loss from bones. It high calcium levels in the blood from some kinds of cancer. It may be used in other people at risk for bone loss. This medicine may be used for other purposes; ask your health care provider or pharmacist if you have questions. COMMON BRAND NAME(S): Zometa What should I tell my care team before I take this medication? They need to know if you have any of these conditions: cancer dehydration dental disease kidney disease liver disease low levels of calcium in the blood lung or breathing disease (asthma) receiving steroids like dexamethasone or prednisone an unusual or allergic reaction to zoledronic acid, other medicines, foods, dyes, or preservatives pregnant or trying to get pregnant breast-feeding How should I use this medication? This drug is injected into a vein. It is given by a health care provider in a hospital or clinic setting. Talk to your health care provider about the use of this drug in children. Special care may be needed. Overdosage: If you think you have taken too much of this medicine contact a poison control center or emergency room at once. NOTE: This medicine is only for you. Do not share this medicine with others. What if I miss a dose? Keep appointments for follow-up doses. It is important not to miss your dose. Call your health care provider if you are unable to keep an appointment. What may interact with this medication? certain antibiotics given by injection NSAIDs, medicines for pain and inflammation, like ibuprofen or naproxen some diuretics like bumetanide, furosemide teriparatide thalidomide This list may not describe all possible interactions. Give your health care provider a list of all the medicines, herbs, non-prescription drugs, or dietary supplements you use. Also tell them if you smoke, drink alcohol, or  use illegal drugs. Some items may interact with your medicine. What should I watch for while using this medication? Visit your health care provider for regular checks on your progress. It may be some time before you see the benefit from this drug. Some people who take this drug have severe bone, joint, or muscle pain. This drug may also increase your risk for jaw problems or a broken thigh bone. Tell your health care provider right away if you have severe pain in your jaw, bones, joints, or muscles. Tell you health care provider if you have any pain that does not go away or that gets worse. Tell your dentist and dental surgeon that you are taking this drug. You should not have major dental surgery while on this drug. See your dentist to have a dental exam and fix any dental problems before starting this drug. Take good care of your teeth while on this drug. Make sure you see your dentist for regular follow-up appointments. You should make sure you get enough calcium and vitamin D while you are taking this drug. Discuss the foods you eat and the vitamins you take with your health care provider. Check with your health care provider if you have severe diarrhea, nausea, and vomiting, or if you sweat a lot. The loss of too much body fluid may make it dangerous for you to take this drug. You may need blood work done while you are taking this drug. Do not become pregnant while taking this drug. Women should inform their health care provider if they wish to become pregnant or think they might be pregnant. There is potential for serious  harm to an unborn child. Talk to your health care provider for more information. What side effects may I notice from receiving this medication? Side effects that you should report to your doctor or health care provider as soon as possible: allergic reactions (skin rash, itching or hives; swelling of the face, lips, or tongue) bone pain infection (fever, chills, cough, sore  throat, pain or trouble passing urine) jaw pain, especially after dental work joint pain kidney injury (trouble passing urine or change in the amount of urine) low blood pressure (dizziness; feeling faint or lightheaded, falls; unusually weak or tired) low calcium levels (fast heartbeat; muscle cramps or pain; pain, tingling, or numbness in the hands or feet; seizures) low magnesium levels (fast, irregular heartbeat; muscle cramp or pain; muscle weakness; tremors; seizures) low red blood cell counts (trouble breathing; feeling faint; lightheaded, falls; unusually weak or tired) muscle pain redness, blistering, peeling, or loosening of the skin, including inside the mouth severe diarrhea swelling of the ankles, feet, hands trouble breathing Side effects that usually do not require medical attention (report to your doctor or health care provider if they continue or are bothersome): anxious constipation coughing depressed mood eye irritation, itching, or pain fever general ill feeling or flu-like symptoms nausea pain, redness, or irritation at site where injected trouble sleeping This list may not describe all possible side effects. Call your doctor for medical advice about side effects. You may report side effects to FDA at 1-800-FDA-1088. Where should I keep my medication? This drug is given in a hospital or clinic. It will not be stored at home. NOTE: This sheet is a summary. It may not cover all possible information. If you have questions about this medicine, talk to your doctor, pharmacist, or health care provider.  2022 Elsevier/Gold Standard (2021-04-11 00:00:00) Leuprolide injection What is this medication? LEUPROLIDE (loo PROE lide) is a man-made hormone. It is used to treat the symptoms of prostate cancer. This medicine may also be used to treat children with early onset of puberty. It may be used for other hormonal conditions. This medicine may be used for other purposes; ask  your health care provider or pharmacist if you have questions. COMMON BRAND NAME(S): Lupron What should I tell my care team before I take this medication? They need to know if you have any of these conditions: diabetes heart disease or previous heart attack high blood pressure high cholesterol pain or difficulty passing urine spinal cord metastasis stroke tobacco smoker an unusual or allergic reaction to leuprolide, benzyl alcohol, other medicines, foods, dyes, or preservatives pregnant or trying to get pregnant breast-feeding How should I use this medication? This medicine is for injection under the skin or into a muscle. You will be taught how to prepare and give this medicine. Use exactly as directed. Take your medicine at regular intervals. Do not take your medicine more often than directed. It is important that you put your used needles and syringes in a special sharps container. Do not put them in a trash can. If you do not have a sharps container, call your pharmacist or healthcare provider to get one. A special MedGuide will be given to you by the pharmacist with each prescription and refill. Be sure to read this information carefully each time. Talk to your pediatrician regarding the use of this medicine in children. While this medicine may be prescribed for children as young as 8 years for selected conditions, precautions do apply. Overdosage: If you think you have taken too  much of this medicine contact a poison control center or emergency room at once. NOTE: This medicine is only for you. Do not share this medicine with others. What if I miss a dose? If you miss a dose, take it as soon as you can. If it is almost time for your next dose, take only that dose. Do not take double or extra doses. What may interact with this medication? Do not take this medicine with any of the following medications: chasteberry cisapride dronedarone pimozide thioridazine This medicine may also  interact with the following medications: herbal or dietary supplements, like black cohosh or DHEA male hormones, like estrogens or progestins and birth control pills, patches, rings, or injections male hormones, like testosterone other medicines that prolong the QT interval (abnormal heart rhythm) This list may not describe all possible interactions. Give your health care provider a list of all the medicines, herbs, non-prescription drugs, or dietary supplements you use. Also tell them if you smoke, drink alcohol, or use illegal drugs. Some items may interact with your medicine. What should I watch for while using this medication? Visit your doctor or health care professional for regular checks on your progress. During the first week, your symptoms may get worse, but then will improve as you continue your treatment. You may get hot flashes, increased bone pain, increased difficulty passing urine, or an aggravation of nerve symptoms. Discuss these effects with your doctor or health care professional, some of them may improve with continued use of this medicine. Male patients may experience a menstrual cycle or spotting during the first 2 months of therapy with this medicine. If this continues, contact your doctor or health care professional. This medicine may increase blood sugar. Ask your healthcare provider if changes in diet or medicines are needed if you have diabetes. What side effects may I notice from receiving this medication? Side effects that you should report to your doctor or health care professional as soon as possible: allergic reactions like skin rash, itching or hives, swelling of the face, lips, or tongue breathing problems chest pain depression or memory disorders pain in your legs or groin pain at site where injected severe headache signs and symptoms of high blood sugar such as being more thirsty or hungry or having to urinate more than normal. You may also feel very tired  or have blurry vision swelling of the feet and legs visual changes vomiting Side effects that usually do not require medical attention (report to your doctor or health care professional if they continue or are bothersome): breast swelling or tenderness decrease in sex drive or performance diarrhea hot flashes loss of appetite muscle, joint, or bone pains nausea redness or irritation at site where injected skin problems or acne This list may not describe all possible side effects. Call your doctor for medical advice about side effects. You may report side effects to FDA at 1-800-FDA-1088. Where should I keep my medication? Keep out of the reach of children. Store below 25 degrees C (77 degrees F). Do not freeze. Protect from light. Do not use if it is not clear or if there are particles present. Throw away any unused medicine after the expiration date. NOTE: This sheet is a summary. It may not cover all possible information. If you have questions about this medicine, talk to your doctor, pharmacist, or health care provider.  2022 Elsevier/Gold Standard (2021-04-11 00:00:00)

## 2021-08-18 NOTE — Progress Notes (Signed)
1228: PT STABLE AT TIME OF DISCHARGE  

## 2021-08-18 NOTE — Progress Notes (Signed)
Patient states he has been having double and triple vision x 1.5 months.  Has not seen PCP or Opt for this issue.  Dr. Bobby Rumpf advised pt to see his PCP or eye doc.

## 2021-08-22 ENCOUNTER — Other Ambulatory Visit: Payer: Self-pay | Admitting: Oncology

## 2021-08-29 DIAGNOSIS — J22 Unspecified acute lower respiratory infection: Secondary | ICD-10-CM | POA: Diagnosis not present

## 2021-08-29 DIAGNOSIS — R058 Other specified cough: Secondary | ICD-10-CM | POA: Diagnosis not present

## 2021-08-29 DIAGNOSIS — Z20822 Contact with and (suspected) exposure to covid-19: Secondary | ICD-10-CM | POA: Diagnosis not present

## 2021-08-29 DIAGNOSIS — R6889 Other general symptoms and signs: Secondary | ICD-10-CM | POA: Diagnosis not present

## 2021-09-05 DIAGNOSIS — J40 Bronchitis, not specified as acute or chronic: Secondary | ICD-10-CM | POA: Diagnosis not present

## 2021-10-16 ENCOUNTER — Other Ambulatory Visit: Payer: Self-pay | Admitting: Hematology and Oncology

## 2021-10-19 ENCOUNTER — Other Ambulatory Visit: Payer: Self-pay | Admitting: Hematology and Oncology

## 2021-10-19 MED ORDER — MORPHINE SULFATE 15 MG PO TABS: 15.0000 mg | ORAL_TABLET | ORAL | 0 refills | Status: DC | PRN

## 2021-11-15 ENCOUNTER — Other Ambulatory Visit: Payer: Self-pay

## 2021-11-15 ENCOUNTER — Inpatient Hospital Stay: Payer: Medicare Other | Attending: Oncology

## 2021-11-15 DIAGNOSIS — Z79899 Other long term (current) drug therapy: Secondary | ICD-10-CM | POA: Diagnosis not present

## 2021-11-15 DIAGNOSIS — C61 Malignant neoplasm of prostate: Secondary | ICD-10-CM | POA: Insufficient documentation

## 2021-11-15 DIAGNOSIS — C7951 Secondary malignant neoplasm of bone: Secondary | ICD-10-CM | POA: Diagnosis not present

## 2021-11-15 DIAGNOSIS — Z5111 Encounter for antineoplastic chemotherapy: Secondary | ICD-10-CM | POA: Insufficient documentation

## 2021-11-15 DIAGNOSIS — D649 Anemia, unspecified: Secondary | ICD-10-CM | POA: Diagnosis not present

## 2021-11-15 LAB — CBC AND DIFFERENTIAL
HCT: 44 (ref 41–53)
Hemoglobin: 14.4 (ref 13.5–17.5)
Neutrophils Absolute: 4.54
Platelets: 237 10*3/uL (ref 150–400)
WBC: 6.4

## 2021-11-15 LAB — HEPATIC FUNCTION PANEL
ALT: 19 U/L (ref 10–40)
AST: 23 (ref 14–40)
Alkaline Phosphatase: 78 (ref 25–125)
Bilirubin, Total: 0.5

## 2021-11-15 LAB — BASIC METABOLIC PANEL
BUN: 14 (ref 4–21)
CO2: 22 (ref 13–22)
Chloride: 106 (ref 99–108)
Creatinine: 1.1 (ref 0.6–1.3)
Glucose: 104
Potassium: 4.3 mEq/L (ref 3.5–5.1)
Sodium: 139 (ref 137–147)

## 2021-11-15 LAB — PSA: Prostatic Specific Antigen: 0.25 ng/mL (ref 0.00–4.00)

## 2021-11-15 LAB — COMPREHENSIVE METABOLIC PANEL
Albumin: 4.3 (ref 3.5–5.0)
Calcium: 8.5 — AB (ref 8.7–10.7)

## 2021-11-15 LAB — CBC: RBC: 4.84 (ref 3.87–5.11)

## 2021-11-15 NOTE — Progress Notes (Signed)
?[facility]  ?[address] ?[place],  [place]  [address] ?[phone] ? ?Clinic Day:  11/16/2021 ? ?Referring physician: [name], MD 3 ? ?HISTORY OF PRESENT ILLNESS:  ?The patient is a 71 y.o. male with metastatic prostate cancer, which includes spread of disease to his bones and retroperitoneal lymph nodes.  He currently takes only Lupron for his androgen deprivation.  In the past, he was also was on enzalutamide, but discontinued this agent due to it making him feel poorly.  He comes in today for routine follow-up.  Since his last visit, the patient has been doing okay.  He denies having new bone pain or other symptoms which concern him for progression of his metastatic prostate cancer.   ? ?PHYSICAL EXAM:  ?Blood pressure (!) 206/98, pulse 71, temperature 97.8 ?F (36.6 ?C), resp. rate 20, height '5\' 11"'$  (1.803 m), weight 210 lb 3.2 oz (95.3 kg), SpO2 99 %. ?Wt Readings from Last 3 Encounters:  ?11/16/21 210 lb (95.3 kg)  ?11/16/21 210 lb 3.2 oz (95.3 kg)  ?08/18/21 203 lb (92.1 kg)  ? ?Body mass index is 29.32 kg/m?Marland Kitchen ?Performance status (ECOG): 1 ?Physical Exam ?Constitutional:   ?   General: He is not in acute distress. ?   Appearance: Normal appearance. He is normal weight.  ?HENT:  ?   Head: Normocephalic and atraumatic.  ?Eyes:  ?   General: No scleral icterus. ?   Extraocular Movements: Extraocular movements intact.  ?   Conjunctiva/sclera: Conjunctivae normal.  ?   Pupils: Pupils are equal, round, and reactive to light.  ?Cardiovascular:  ?   Rate and Rhythm: Normal rate and regular rhythm.  ?   Pulses: Normal pulses.  ?   Heart sounds: Normal heart sounds. No murmur heard. ?  No friction rub. No gallop.  ?Pulmonary:  ?   Effort: Pulmonary effort is normal. No respiratory distress.  ?   Breath sounds: Normal breath sounds.  ?Abdominal:  ?   General: Bowel sounds are normal. There is no distension.  ?   Palpations: Abdomen is soft. There is no hepatomegaly,  splenomegaly or mass.  ?   Tenderness: There is no abdominal tenderness.  ?Musculoskeletal:     ?   General: Normal range of motion.  ?   Cervical back: Normal range of motion and neck supple.  ?   Right lower leg: No edema.  ?   Left lower leg: No edema.  ?Lymphadenopathy:  ?   Cervical: No cervical adenopathy.  ?Skin: ?   General: Skin is warm and dry.  ?Neurological:  ?   General: No focal deficit present.  ?   Mental Status: He is alert and oriented to person, place, and time. Mental status is at baseline.  ?Psychiatric:     ?   Mood and Affect: Mood normal.     ?   Behavior: Behavior normal.     ?   Thought Content: Thought content normal.     ?   Judgment: Judgment normal.  ? ? ? Latest Reference Range & Units 04/19/21 09:01 08/17/21 10:40 11/15/21 09:41  ?Prostatic Specific Antigen 0.00 - 4.00 ng/mL 0.10 0.25 0.25  ? ? ?ASSESSMENT & PLAN:  ?Assessment/Plan:  A 71 y.o. male with metastatic prostate cancer.  When evaluating this gentleman's labs today, this gentleman's PSA has held stable over these past months.  As mentioned previously, the patient did not tolerate enzalutamide, which prevents him from remaining on complete  androgen blockade therapy.  However, as his PSA has held stable and remains relatively low, he will continue androgen deprivation with Lupron shots every 3 weeks.  He will also continue to receive Xgeva to protect his bones against worsening metastatic disease.  I remain very concerned with his poorly controlled blood pressure.  He knows that it remains imperative for him to work with his primary care office in getting this under control before a severe cardiovascular insulin develops over time.  Otherwise, I will see the patient back in 3 months for repeat clinical assessment.  The patient understands all the plans discussed today and is in agreement with them. ? ? ?Rozelle Caudle Macarthur Critchley, MD ?   ? ? ?  ?

## 2021-11-16 ENCOUNTER — Other Ambulatory Visit: Payer: Self-pay | Admitting: Oncology

## 2021-11-16 ENCOUNTER — Inpatient Hospital Stay: Payer: Medicare Other

## 2021-11-16 ENCOUNTER — Inpatient Hospital Stay: Payer: Medicare Other | Admitting: Oncology

## 2021-11-16 VITALS — BP 206/98 | HR 71 | Temp 97.8°F | Resp 20 | Ht 71.0 in | Wt 210.2 lb

## 2021-11-16 VITALS — BP 184/88 | HR 76 | Temp 98.3°F | Resp 20 | Wt 210.0 lb

## 2021-11-16 DIAGNOSIS — G43009 Migraine without aura, not intractable, without status migrainosus: Secondary | ICD-10-CM

## 2021-11-16 DIAGNOSIS — C61 Malignant neoplasm of prostate: Secondary | ICD-10-CM | POA: Diagnosis not present

## 2021-11-16 DIAGNOSIS — C7951 Secondary malignant neoplasm of bone: Secondary | ICD-10-CM | POA: Diagnosis not present

## 2021-11-16 DIAGNOSIS — Z79899 Other long term (current) drug therapy: Secondary | ICD-10-CM | POA: Diagnosis not present

## 2021-11-16 DIAGNOSIS — Z5111 Encounter for antineoplastic chemotherapy: Secondary | ICD-10-CM | POA: Diagnosis not present

## 2021-11-16 MED ORDER — ZOLEDRONIC ACID 4 MG/100ML IV SOLN
4.0000 mg | Freq: Once | INTRAVENOUS | Status: DC
  Filled 2021-11-16: qty 100

## 2021-11-16 MED ORDER — LEUPROLIDE ACETATE (3 MONTH) 22.5 MG IM KIT
22.5000 mg | PACK | Freq: Once | INTRAMUSCULAR | Status: AC
  Administered 2021-11-16: 22.5 mg via INTRAMUSCULAR
  Filled 2021-11-16: qty 22.5

## 2021-11-16 MED ORDER — SODIUM CHLORIDE 0.9 % IV SOLN: Freq: Once | INTRAVENOUS | Status: DC

## 2021-11-16 MED ORDER — DENOSUMAB 120 MG/1.7ML ~~LOC~~ SOLN
120.0000 mg | Freq: Once | SUBCUTANEOUS | Status: AC
  Administered 2021-11-16: 120 mg via SUBCUTANEOUS
  Filled 2021-11-16: qty 1.7

## 2021-11-16 NOTE — Patient Instructions (Addendum)
Leuprolide injection ?What is this medication? ?LEUPROLIDE (loo PROE lide) is a man-made hormone. It is used to treat the symptoms of prostate cancer. This medicine may also be used to treat children with early onset of puberty. It may be used for other hormonal conditions. ?This medicine may be used for other purposes; ask your health care provider or pharmacist if you have questions. ?COMMON BRAND NAME(S): Lupron ?What should I tell my care team before I take this medication? ?They need to know if you have any of these conditions: ?diabetes ?heart disease or previous heart attack ?high blood pressure ?high cholesterol ?pain or difficulty passing urine ?spinal cord metastasis ?stroke ?tobacco smoker ?an unusual or allergic reaction to leuprolide, benzyl alcohol, other medicines, foods, dyes, or preservatives ?pregnant or trying to get pregnant ?breast-feeding ?How should I use this medication? ?This medicine is for injection under the skin or into a muscle. You will be taught how to prepare and give this medicine. Use exactly as directed. Take your medicine at regular intervals. Do not take your medicine more often than directed. ?It is important that you put your used needles and syringes in a special sharps container. Do not put them in a trash can. If you do not have a sharps container, call your pharmacist or healthcare provider to get one. ?A special MedGuide will be given to you by the pharmacist with each prescription and refill. Be sure to read this information carefully each time. ?Talk to your pediatrician regarding the use of this medicine in children. While this medicine may be prescribed for children as young as 8 years for selected conditions, precautions do apply. ?Overdosage: If you think you have taken too much of this medicine contact a poison control center or emergency room at once. ?NOTE: This medicine is only for you. Do not share this medicine with others. ?What if I miss a dose? ?If you miss  a dose, take it as soon as you can. If it is almost time for your next dose, take only that dose. Do not take double or extra doses. ?What may interact with this medication? ?Do not take this medicine with any of the following medications: ?chasteberry ?cisapride ?dronedarone ?pimozide ?thioridazine ?This medicine may also interact with the following medications: ?herbal or dietary supplements, like black cohosh or DHEA ?male hormones, like estrogens or progestins and birth control pills, patches, rings, or injections ?male hormones, like testosterone ?other medicines that prolong the QT interval (abnormal heart rhythm) ?This list may not describe all possible interactions. Give your health care provider a list of all the medicines, herbs, non-prescription drugs, or dietary supplements you use. Also tell them if you smoke, drink alcohol, or use illegal drugs. Some items may interact with your medicine. ?What should I watch for while using this medication? ?Visit your doctor or health care professional for regular checks on your progress. During the first week, your symptoms may get worse, but then will improve as you continue your treatment. You may get hot flashes, increased bone pain, increased difficulty passing urine, or an aggravation of nerve symptoms. Discuss these effects with your doctor or health care professional, some of them may improve with continued use of this medicine. ?Male patients may experience a menstrual cycle or spotting during the first 2 months of therapy with this medicine. If this continues, contact your doctor or health care professional. ?This medicine may increase blood sugar. Ask your healthcare provider if changes in diet or medicines are needed if you have  diabetes. ?What side effects may I notice from receiving this medication? ?Side effects that you should report to your doctor or health care professional as soon as possible: ?allergic reactions like skin rash, itching or  hives, swelling of the face, lips, or tongue ?breathing problems ?chest pain ?depression or memory disorders ?pain in your legs or groin ?pain at site where injected ?severe headache ?signs and symptoms of high blood sugar such as being more thirsty or hungry or having to urinate more than normal. You may also feel very tired or have blurry vision ?swelling of the feet and legs ?visual changes ?vomiting ?Side effects that usually do not require medical attention (report to your doctor or health care professional if they continue or are bothersome): ?breast swelling or tenderness ?decrease in sex drive or performance ?diarrhea ?hot flashes ?loss of appetite ?muscle, joint, or bone pains ?nausea ?redness or irritation at site where injected ?skin problems or acne ?This list may not describe all possible side effects. Call your doctor for medical advice about side effects. You may report side effects to FDA at 1-800-FDA-1088. ?Where should I keep my medication? ?Keep out of the reach of children. ?Store below 25 degrees C (77 degrees F). Do not freeze. Protect from light. Do not use if it is not clear or if there are particles present. Throw away any unused medicine after the expiration date. ?NOTE: This sheet is a summary. It may not cover all possible information. If you have questions about this medicine, talk to your doctor, pharmacist, or health care provider. ?? 2022 Elsevier/Gold Standard (2021-04-11 00:00:00) ?Denosumab injection ?What is this medication? ?DENOSUMAB (den oh sue mab) slows bone breakdown. Prolia is used to treat osteoporosis in women after menopause and in men, and in people who are taking corticosteroids for 6 months or more. Delton See is used to treat a high calcium level due to cancer and to prevent bone fractures and other bone problems caused by multiple myeloma or cancer bone metastases. Delton See is also used to treat giant cell tumor of the bone. ?This medicine may be used for other purposes;  ask your health care provider or pharmacist if you have questions. ?COMMON BRAND NAME(S): Prolia, XGEVA ?What should I tell my care team before I take this medication? ?They need to know if you have any of these conditions: ?dental disease ?having surgery or tooth extraction ?infection ?kidney disease ?low levels of calcium or Vitamin D in the blood ?malnutrition ?on hemodialysis ?skin conditions or sensitivity ?thyroid or parathyroid disease ?an unusual reaction to denosumab, other medicines, foods, dyes, or preservatives ?pregnant or trying to get pregnant ?breast-feeding ?How should I use this medication? ?This medicine is for injection under the skin. It is given by a health care professional in a hospital or clinic setting. ?A special MedGuide will be given to you before each treatment. Be sure to read this information carefully each time. ?For Prolia, talk to your pediatrician regarding the use of this medicine in children. Special care may be needed. For Delton See, talk to your pediatrician regarding the use of this medicine in children. While this drug may be prescribed for children as young as 13 years for selected conditions, precautions do apply. ?Overdosage: If you think you have taken too much of this medicine contact a poison control center or emergency room at once. ?NOTE: This medicine is only for you. Do not share this medicine with others. ?What if I miss a dose? ?It is important not to miss your dose.  Call your doctor or health care professional if you are unable to keep an appointment. ?What may interact with this medication? ?Do not take this medicine with any of the following medications: ?other medicines containing denosumab ?This medicine may also interact with the following medications: ?medicines that lower your chance of fighting infection ?steroid medicines like prednisone or cortisone ?This list may not describe all possible interactions. Give your health care provider a list of all the  medicines, herbs, non-prescription drugs, or dietary supplements you use. Also tell them if you smoke, drink alcohol, or use illegal drugs. Some items may interact with your medicine. ?What should I watch for

## 2021-11-18 DIAGNOSIS — L03113 Cellulitis of right upper limb: Secondary | ICD-10-CM | POA: Diagnosis not present

## 2021-11-23 ENCOUNTER — Other Ambulatory Visit: Payer: Self-pay | Admitting: Hematology and Oncology

## 2021-11-23 DIAGNOSIS — C7951 Secondary malignant neoplasm of bone: Secondary | ICD-10-CM

## 2021-11-23 DIAGNOSIS — M545 Low back pain, unspecified: Secondary | ICD-10-CM

## 2021-11-23 DIAGNOSIS — C61 Malignant neoplasm of prostate: Secondary | ICD-10-CM

## 2021-11-24 ENCOUNTER — Encounter: Payer: Self-pay | Admitting: Oncology

## 2022-01-15 DIAGNOSIS — R062 Wheezing: Secondary | ICD-10-CM | POA: Diagnosis not present

## 2022-01-15 DIAGNOSIS — J441 Chronic obstructive pulmonary disease with (acute) exacerbation: Secondary | ICD-10-CM | POA: Diagnosis not present

## 2022-01-15 DIAGNOSIS — R0789 Other chest pain: Secondary | ICD-10-CM | POA: Diagnosis not present

## 2022-01-17 DIAGNOSIS — J441 Chronic obstructive pulmonary disease with (acute) exacerbation: Secondary | ICD-10-CM | POA: Diagnosis not present

## 2022-01-17 DIAGNOSIS — U071 COVID-19: Secondary | ICD-10-CM | POA: Diagnosis not present

## 2022-02-13 ENCOUNTER — Encounter: Payer: Self-pay | Admitting: Oncology

## 2022-02-14 ENCOUNTER — Inpatient Hospital Stay: Payer: Medicare Other

## 2022-02-15 ENCOUNTER — Inpatient Hospital Stay: Payer: Medicare Other | Attending: Oncology

## 2022-02-15 ENCOUNTER — Ambulatory Visit: Payer: Medicare Other | Admitting: Oncology

## 2022-02-15 ENCOUNTER — Telehealth: Payer: Self-pay | Admitting: Oncology

## 2022-02-15 ENCOUNTER — Ambulatory Visit: Payer: Medicare Other

## 2022-02-15 NOTE — Telephone Encounter (Signed)
Contacted pt to reschedule lab appt. Unable to reach via phone, voicemail was left.

## 2022-02-16 ENCOUNTER — Ambulatory Visit: Payer: Medicare Other | Admitting: Oncology

## 2022-02-27 DIAGNOSIS — R5383 Other fatigue: Secondary | ICD-10-CM | POA: Diagnosis not present

## 2022-02-27 DIAGNOSIS — Z0000 Encounter for general adult medical examination without abnormal findings: Secondary | ICD-10-CM | POA: Diagnosis not present

## 2022-02-27 DIAGNOSIS — Z79899 Other long term (current) drug therapy: Secondary | ICD-10-CM | POA: Diagnosis not present

## 2022-02-27 DIAGNOSIS — I10 Essential (primary) hypertension: Secondary | ICD-10-CM | POA: Diagnosis not present

## 2022-03-29 ENCOUNTER — Other Ambulatory Visit: Payer: Self-pay

## 2022-04-02 ENCOUNTER — Ambulatory Visit: Payer: Medicare Other | Admitting: Oncology

## 2022-04-03 ENCOUNTER — Other Ambulatory Visit: Payer: Self-pay | Admitting: Oncology

## 2022-04-03 ENCOUNTER — Inpatient Hospital Stay: Payer: Medicare Other

## 2022-04-03 ENCOUNTER — Inpatient Hospital Stay: Payer: Medicare Other | Attending: Oncology | Admitting: Oncology

## 2022-04-03 VITALS — BP 155/76 | HR 84 | Temp 97.8°F | Resp 18 | Ht 71.0 in | Wt 217.2 lb

## 2022-04-03 DIAGNOSIS — C61 Malignant neoplasm of prostate: Secondary | ICD-10-CM

## 2022-04-03 DIAGNOSIS — C7951 Secondary malignant neoplasm of bone: Secondary | ICD-10-CM

## 2022-04-03 DIAGNOSIS — C7952 Secondary malignant neoplasm of bone marrow: Secondary | ICD-10-CM | POA: Diagnosis not present

## 2022-04-03 LAB — CBC AND DIFFERENTIAL
HCT: 44 (ref 41–53)
Hemoglobin: 14.6 (ref 13.5–17.5)
Neutrophils Absolute: 4
Platelets: 210 10*3/uL (ref 150–400)
WBC: 7.7

## 2022-04-03 LAB — PSA: Prostatic Specific Antigen: 0.45 ng/mL (ref 0.00–4.00)

## 2022-04-03 MED ORDER — MORPHINE SULFATE 15 MG PO TABS: 15.0000 mg | ORAL_TABLET | Freq: Four times a day (QID) | ORAL | 0 refills | Status: DC | PRN

## 2022-04-03 NOTE — Progress Notes (Incomplete)
[facility]  [address] [place],  [place]  [address] [phone]  Clinic Day:  04/03/2022  Referring physician: [name], MD 3  HISTORY OF PRESENT ILLNESS:  The patient is a 71 y.o. male with metastatic prostate cancer, which includes spread of disease to his bones and retroperitoneal lymph nodes.  He currently takes only Lupron for his androgen deprivation.  In the past, he was also was on enzalutamide, but discontinued this agent due to it making him feel poorly.  He comes in today for routine follow-up.  Since his last visit, the patient has been doing okay.  He denies having new bone pain or other symptoms which concern him for progression of his metastatic prostate cancer.    PHYSICAL EXAM:  There were no vitals taken for this visit. Wt Readings from Last 3 Encounters:  11/16/21 210 lb (95.3 kg)  11/16/21 210 lb 3.2 oz (95.3 kg)  08/18/21 203 lb (92.1 kg)   There is no height or weight on file to calculate BMI. Performance status (ECOG): 1 Physical Exam Constitutional:      General: He is not in acute distress.    Appearance: Normal appearance. He is normal weight.  HENT:     Head: Normocephalic and atraumatic.  Eyes:     General: No scleral icterus.    Extraocular Movements: Extraocular movements intact.     Conjunctiva/sclera: Conjunctivae normal.     Pupils: Pupils are equal, round, and reactive to light.  Cardiovascular:     Rate and Rhythm: Normal rate and regular rhythm.     Pulses: Normal pulses.     Heart sounds: Normal heart sounds. No murmur heard.    No friction rub. No gallop.  Pulmonary:     Effort: Pulmonary effort is normal. No respiratory distress.     Breath sounds: Normal breath sounds.  Abdominal:     General: Bowel sounds are normal. There is no distension.     Palpations: Abdomen is soft. There is no hepatomegaly, splenomegaly or mass.     Tenderness: There is no abdominal tenderness.  Musculoskeletal:         General: Normal range of motion.     Cervical back: Normal range of motion and neck supple.     Right lower leg: No edema.     Left lower leg: No edema.  Lymphadenopathy:     Cervical: No cervical adenopathy.  Skin:    General: Skin is warm and dry.  Neurological:     General: No focal deficit present.     Mental Status: He is alert and oriented to person, place, and time. Mental status is at baseline.  Psychiatric:        Mood and Affect: Mood normal.        Behavior: Behavior normal.        Thought Content: Thought content normal.        Judgment: Judgment normal.      Latest Reference Range & Units 04/19/21 09:01 08/17/21 10:40 11/15/21 09:41  Prostatic Specific Antigen 0.00 - 4.00 ng/mL 0.10 0.25 0.25    ASSESSMENT & PLAN:  Assessment/Plan:  A 71 y.o. male with metastatic prostate cancer.  When evaluating this gentleman's labs today, this gentleman's PSA has held stable over these past months.  As mentioned previously, the patient did not tolerate enzalutamide, which prevents him from remaining on complete androgen blockade therapy.  However, as his PSA has held stable and remains relatively  low, he will continue androgen deprivation with Lupron shots every 3 weeks.  He will also continue to receive Xgeva to protect his bones against worsening metastatic disease.  I remain very concerned with his poorly controlled blood pressure.  He knows that it remains imperative for him to work with his primary care office in getting this under control before a severe cardiovascular insulin develops over time.  Otherwise, I will see the patient back in 3 months for repeat clinical assessment.  The patient understands all the plans discussed today and is in agreement with them.   Hurshel Bouillon Macarthur Critchley, MD

## 2022-04-03 NOTE — Progress Notes (Signed)
[facility]  [address] [place],  [place]  [address] [phone]  Clinic Day:  04/03/2022  Referring physician: [name], MD   HISTORY OF PRESENT ILLNESS:  The patient is a 71 y.o. male with metastatic prostate cancer, which includes spread of disease to his bones and retroperitoneal lymph nodes.  He currently takes only Lupron for his androgen deprivation.  However, he missed his dose last month.  Apparently, a recent dose of Zometa led to an infection in his arm which made him apprehensive about coming back for any of his prostate cancer treatment.  In the past, this gentleman was also was on enzalutamide, but discontinued this agent due to it making him feel poorly.  He comes in today for routine follow-up.  Since his last visit, the patient has been doing okay.  He has chronic lower pain, but denies having new bone pain or other symptoms which concern him for overt progression of his metastatic prostate cancer.    PHYSICAL EXAM:  Blood pressure (!) 155/76, pulse 84, temperature 97.8 F (36.6 C), resp. rate 18, height '5\' 11"'$  (1.803 m), weight 217 lb 3.2 oz (98.5 kg), SpO2 97 %. Wt Readings from Last 3 Encounters:  04/03/22 217 lb 3.2 oz (98.5 kg)  11/16/21 210 lb (95.3 kg)  11/16/21 210 lb 3.2 oz (95.3 kg)   Body mass index is 30.29 kg/m. Performance status (ECOG): 1 Physical Exam Constitutional:      General: He is not in acute distress.    Appearance: Normal appearance. He is normal weight.  HENT:     Head: Normocephalic and atraumatic.  Eyes:     General: No scleral icterus.    Extraocular Movements: Extraocular movements intact.     Conjunctiva/sclera: Conjunctivae normal.     Pupils: Pupils are equal, round, and reactive to light.  Cardiovascular:     Rate and Rhythm: Normal rate and regular rhythm.     Pulses: Normal pulses.     Heart sounds: Normal heart sounds. No murmur heard.    No friction rub. No gallop.  Pulmonary:      Effort: Pulmonary effort is normal. No respiratory distress.     Breath sounds: Normal breath sounds.  Abdominal:     General: Bowel sounds are normal. There is no distension.     Palpations: Abdomen is soft. There is no hepatomegaly, splenomegaly or mass.     Tenderness: There is no abdominal tenderness.  Musculoskeletal:        General: Normal range of motion.     Cervical back: Normal range of motion and neck supple.     Right lower leg: No edema.     Left lower leg: No edema.  Lymphadenopathy:     Cervical: No cervical adenopathy.  Skin:    General: Skin is warm and dry.  Neurological:     General: No focal deficit present.     Mental Status: He is alert and oriented to person, place, and time. Mental status is at baseline.  Psychiatric:        Mood and Affect: Mood normal.        Behavior: Behavior normal.        Thought Content: Thought content normal.        Judgment: Judgment normal.   ............PSA PENDING...........Marland Kitchen   Latest Reference Range & Units 04/19/21 09:01 08/17/21 10:40 11/15/21 09:41  Prostatic Specific Antigen 0.00 - 4.00 ng/mL 0.10 0.25 0.25  ASSESSMENT & PLAN:  Assessment/Plan:  A 71 y.o. male with metastatic prostate cancer.  When evaluating this gentleman's labs today, this gentleman's PSA has   He has agreed to restart his Lupron injections, which is usually given once every 3 months.  His next dose will be scheduled for September 1st.  I will see him back in 3 months for repeat clinical assessment.  Due to the arm infection he had with Zometa, he is not interested in restarting this agent.  For now, I will acquiesce to his wishes.  This may change over time if he develops new bone metastases that  would require some type of this bisphosphonate to prevent worsening osseous disease over time.  The patient understands all the plans discussed today and is in agreement with them.   Jessica Checketts Macarthur Critchley, MD

## 2022-04-04 ENCOUNTER — Encounter: Payer: Self-pay | Admitting: Oncology

## 2022-04-04 LAB — BASIC METABOLIC PANEL
BUN: 14 (ref 4–21)
CO2: 23 — AB (ref 13–22)
Chloride: 108 (ref 99–108)
Creatinine: 1.1 (ref 0.6–1.3)
Glucose: 98
Potassium: 4.4 mEq/L (ref 3.5–5.1)
Sodium: 139 (ref 137–147)

## 2022-04-04 LAB — CBC: RBC: 4.78 (ref 3.87–5.11)

## 2022-04-04 LAB — COMPREHENSIVE METABOLIC PANEL: Calcium: 9.1 (ref 8.7–10.7)

## 2022-04-06 ENCOUNTER — Inpatient Hospital Stay: Payer: Medicare Other | Attending: Oncology

## 2022-04-06 VITALS — BP 171/80 | HR 64 | Temp 97.6°F | Resp 18 | Ht 71.0 in | Wt 219.0 lb

## 2022-04-06 DIAGNOSIS — Z5111 Encounter for antineoplastic chemotherapy: Secondary | ICD-10-CM | POA: Diagnosis not present

## 2022-04-06 DIAGNOSIS — C7951 Secondary malignant neoplasm of bone: Secondary | ICD-10-CM | POA: Diagnosis not present

## 2022-04-06 DIAGNOSIS — C61 Malignant neoplasm of prostate: Secondary | ICD-10-CM | POA: Insufficient documentation

## 2022-04-06 DIAGNOSIS — Z79899 Other long term (current) drug therapy: Secondary | ICD-10-CM | POA: Diagnosis not present

## 2022-04-06 MED ORDER — DENOSUMAB 120 MG/1.7ML ~~LOC~~ SOLN
120.0000 mg | Freq: Once | SUBCUTANEOUS | Status: AC
  Administered 2022-04-06: 120 mg via SUBCUTANEOUS
  Filled 2022-04-06: qty 1.7

## 2022-04-06 MED ORDER — LEUPROLIDE ACETATE (3 MONTH) 22.5 MG IM KIT
22.5000 mg | PACK | Freq: Once | INTRAMUSCULAR | Status: AC
  Administered 2022-04-06: 22.5 mg via INTRAMUSCULAR
  Filled 2022-04-06: qty 22.5

## 2022-04-06 NOTE — Patient Instructions (Signed)
Denosumab Injection (Oncology) What is this medication? DENOSUMAB (den oh SUE mab) prevents weakened bones caused by cancer. It may also be used to treat noncancerous bone tumors that cannot be removed by surgery. It can also be used to treat high calcium levels in the blood caused by cancer. It works by blocking a protein that causes bones to break down quickly. This slows down the release of calcium from bones, which lowers calcium levels in your blood. It also makes your bones stronger and less likely to break (fracture). This medicine may be used for other purposes; ask your health care provider or pharmacist if you have questions. COMMON BRAND NAME(S): XGEVA What should I tell my care team before I take this medication? They need to know if you have any of these conditions: Dental disease Having surgery or tooth extraction Infection Kidney disease Low levels of calcium or vitamin D in the blood Malnutrition On hemodialysis Skin conditions or sensitivity Thyroid or parathyroid disease An unusual reaction to denosumab, other medications, foods, dyes, or preservatives Pregnant or trying to get pregnant Breast-feeding How should I use this medication? This medication is for injection under the skin. It is given by your care team in a hospital or clinic setting. A special MedGuide will be given to you before each treatment. Be sure to read this information carefully each time. Talk to your care team about the use of this medication in children. While it may be prescribed for children as young as 13 years for selected conditions, precautions do apply. Overdosage: If you think you have taken too much of this medicine contact a poison control center or emergency room at once. NOTE: This medicine is only for you. Do not share this medicine with others. What if I miss a dose? Keep appointments for follow-up doses. It is important not to miss your dose. Call your care team if you are unable to  keep an appointment. What may interact with this medication? Do not take this medication with any of the following: Other medications containing denosumab This medication may also interact with the following: Medications that lower your chance of fighting infection Steroid medications, such as prednisone or cortisone This list may not describe all possible interactions. Give your health care provider a list of all the medicines, herbs, non-prescription drugs, or dietary supplements you use. Also tell them if you smoke, drink alcohol, or use illegal drugs. Some items may interact with your medicine. What should I watch for while using this medication? Your condition will be monitored carefully while you are receiving this medication. You may need blood work while taking this medication. This medication may increase your risk of getting an infection. Call your care team for advice if you get a fever, chills, sore throat, or other symptoms of a cold or flu. Do not treat yourself. Try to avoid being around people who are sick. You should make sure you get enough calcium and vitamin D while you are taking this medication, unless your care team tells you not to. Discuss the foods you eat and the vitamins you take with your care team. Some people who take this medication have severe bone, joint, or muscle pain. This medication may also increase your risk for jaw problems or a broken thigh bone. Tell your care team right away if you have severe pain in your jaw, bones, joints, or muscles. Tell your care team if you have any pain that does not go away or that gets worse. Talk   to your care team if you may be pregnant. Serious birth defects can occur if you take this medication during pregnancy and for 5 months after the last dose. You will need a negative pregnancy test before starting this medication. Contraception is recommended while taking this medication and for 5 months after the last dose. Your care team  can help you find the option that works for you. What side effects may I notice from receiving this medication? Side effects that you should report to your care team as soon as possible: Allergic reactions--skin rash, itching, hives, swelling of the face, lips, tongue, or throat Bone, joint, or muscle pain Low calcium level--muscle pain or cramps, confusion, tingling, or numbness in the hands or feet Osteonecrosis of the jaw--pain, swelling, or redness in the mouth, numbness of the jaw, poor healing after dental work, unusual discharge from the mouth, visible bones in the mouth Side effects that usually do not require medical attention (report to your care team if they continue or are bothersome): Cough Diarrhea Fatigue Headache Nausea This list may not describe all possible side effects. Call your doctor for medical advice about side effects. You may report side effects to FDA at 1-800-FDA-1088. Where should I keep my medication? This medication is given in a hospital or clinic. It will not be stored at home. NOTE: This sheet is a summary. It may not cover all possible information. If you have questions about this medicine, talk to your doctor, pharmacist, or health care provider.  2023 Elsevier/Gold Standard (2021-12-11 00:00:00) Leuprolide Solution for Injection What is this medication? LEUPROLIDE (loo PROE lide) reduces the symptoms of prostate cancer. It works by decreasing levels of the hormone testosterone in the body. This prevents prostate cancer cells from spreading or growing. This medicine may be used for other purposes; ask your health care provider or pharmacist if you have questions. COMMON BRAND NAME(S): Lupron What should I tell my care team before I take this medication? They need to know if you have any of these conditions: Diabetes Heart attack Heart disease High blood pressure High cholesterol Pain or difficulty passing urine Spinal cord  metastasis Stroke Tobacco use An unusual or allergic reaction to leuprolide, other medications, foods, dyes, or preservatives Pregnant or trying to get pregnant Breast-feeding How should I use this medication? This medication is for injection under the skin or into a muscle. You will be taught how to prepare and give this medication. Use exactly as directed. Take your medication at regular intervals. Do not take it more often than directed. It is important that you put your used needles and syringes in a special sharps container. Do not put them in a trash can. If you do not have a sharps container, call your care team to get one. A special MedGuide will be given to you by the pharmacist with each prescription and refill. Be sure to read this information carefully each time. Talk to your care team about the use of this medication in children. While this medication may be prescribed for children as young as 8 years for selected conditions, precautions do apply. Overdosage: If you think you have taken too much of this medicine contact a poison control center or emergency room at once. NOTE: This medicine is only for you. Do not share this medicine with others. What if I miss a dose? If you miss a dose, take it as soon as you can. If it is almost time for your next dose, take only that   dose. Do not take double or extra doses. What may interact with this medication? Do not take this medication with any of the following: Chasteberry Cisapride Dronedarone Pimozide Thioridazine This medication may also interact with the following: Estrogen or progestin hormones Herbal or dietary supplements, like black cohosh or DHEA Other medications that cause heart rhythm changes Testosterone This list may not describe all possible interactions. Give your health care provider a list of all the medicines, herbs, non-prescription drugs, or dietary supplements you use. Also tell them if you smoke, drink alcohol,  or use illegal drugs. Some items may interact with your medicine. What should I watch for while using this medication? Visit your care team for regular checks on your progress. During the first week, your symptoms may get worse, but then will improve as you continue your treatment. You may get hot flashes, increased bone pain, increased difficulty passing urine, or an aggravation of nerve symptoms. Discuss these effects with your care team, some of them may improve with continued use of this medication. Patients may experience a menstrual cycle or spotting during the first 2 months of therapy with this medication. If this continues, contact your care team. This medication may increase blood sugar. The risk may be higher in patients who already have diabetes. Ask your care team what you can do to lower your risk of diabetes while taking this medication. What side effects may I notice from receiving this medication? Side effects that you should report to your care team as soon as possible: Allergic reactions--skin rash, itching, hives, swelling of the face, lips, tongue, or throat Heart attack--pain or tightness in the chest, shoulders, arms, or jaw, nausea, shortness of breath, cold or clammy skin, feeling faint or lightheaded Heart rhythm changes--fast or irregular heartbeat, dizziness, feeling faint or lightheaded, chest pain, trouble breathing High blood sugar (hyperglycemia)--increased thirst or amount of urine, unusual weakness or fatigue, blurry vision Mood swings, irritability, hostility Seizures Stroke--sudden numbness or weakness of the face, arm, or leg, trouble speaking, confusion, trouble walking, loss of balance or coordination, dizziness, severe headache, change in vision Thoughts of suicide or self-harm, worsening mood, feelings of depression Side effects that usually do not require medical attention (report to your care team if they continue or are bothersome): Bone pain Change in sex  drive or performance General discomfort and fatigue Hot flashes Muscle pain Pain, redness, or irritation at injection site Swelling of the ankles, hands, or feet This list may not describe all possible side effects. Call your doctor for medical advice about side effects. You may report side effects to FDA at 1-800-FDA-1088. Where should I keep my medication? Keep out of the reach of children and pets. Store below 25 degrees C (77 degrees F). Do not freeze. Protect from light. Get rid of any unused medication after the expiration date. To get rid of medications that are no longer needed or have expired: Take the medication to a medication take-back program. Check with your pharmacy or law enforcement to find a location. If you cannot return the medication, ask your pharmacist or care team how to get rid of this medication safely. NOTE: This sheet is a summary. It may not cover all possible information. If you have questions about this medicine, talk to your doctor, pharmacist, or health care provider.  2023 Elsevier/Gold Standard (2021-06-23 00:00:00)  

## 2022-07-02 ENCOUNTER — Other Ambulatory Visit: Payer: Self-pay

## 2022-07-02 ENCOUNTER — Inpatient Hospital Stay: Payer: Medicare Other | Attending: Oncology

## 2022-07-02 DIAGNOSIS — C61 Malignant neoplasm of prostate: Secondary | ICD-10-CM

## 2022-07-02 LAB — CBC WITH DIFFERENTIAL (CANCER CENTER ONLY)
Abs Immature Granulocytes: 0.02 10*3/uL (ref 0.00–0.07)
Basophils Absolute: 0 10*3/uL (ref 0.0–0.1)
Basophils Relative: 1 %
Eosinophils Absolute: 0.2 10*3/uL (ref 0.0–0.5)
Eosinophils Relative: 3 %
HCT: 41.9 % (ref 39.0–52.0)
Hemoglobin: 13.9 g/dL (ref 13.0–17.0)
Immature Granulocytes: 0 %
Lymphocytes Relative: 21 %
Lymphs Abs: 1.3 10*3/uL (ref 0.7–4.0)
MCH: 31 pg (ref 26.0–34.0)
MCHC: 33.2 g/dL (ref 30.0–36.0)
MCV: 93.5 fL (ref 80.0–100.0)
Monocytes Absolute: 0.5 10*3/uL (ref 0.1–1.0)
Monocytes Relative: 8 %
Neutro Abs: 4.3 10*3/uL (ref 1.7–7.7)
Neutrophils Relative %: 67 %
Platelet Count: 212 10*3/uL (ref 150–400)
RBC: 4.48 MIL/uL (ref 4.22–5.81)
RDW: 13.1 % (ref 11.5–15.5)
WBC Count: 6.3 10*3/uL (ref 4.0–10.5)
nRBC: 0 % (ref 0.0–0.2)

## 2022-07-02 LAB — CMP (CANCER CENTER ONLY)
ALT: 14 U/L (ref 0–44)
AST: 14 U/L — ABNORMAL LOW (ref 15–41)
Albumin: 3.8 g/dL (ref 3.5–5.0)
Alkaline Phosphatase: 69 U/L (ref 38–126)
Anion gap: 9 (ref 5–15)
BUN: 13 mg/dL (ref 8–23)
CO2: 25 mmol/L (ref 22–32)
Calcium: 8.5 mg/dL — ABNORMAL LOW (ref 8.9–10.3)
Chloride: 105 mmol/L (ref 98–111)
Creatinine: 1.14 mg/dL (ref 0.61–1.24)
GFR, Estimated: >60 mL/min (ref >60)
Glucose, Bld: 162 mg/dL — ABNORMAL HIGH (ref 70–99)
Potassium: 4 mmol/L (ref 3.5–5.1)
Sodium: 139 mmol/L (ref 135–145)
Total Bilirubin: 0.5 mg/dL (ref 0.3–1.2)
Total Protein: 7.1 g/dL (ref 6.5–8.1)

## 2022-07-02 LAB — PSA: Prostatic Specific Antigen: 0.77 ng/mL (ref 0.00–4.00)

## 2022-07-03 NOTE — Progress Notes (Signed)
[facility]  [address] [place],  [place]  [address] [phone]  Clinic Day:  07/04/2022  Referring physician: [name], MD   HISTORY OF PRESENT ILLNESS:  The patient is a 71 y.o. male with metastatic prostate cancer, which includes spread of disease to his bones and retroperitoneal lymph nodes.  He currently takes only Lupron for his androgen deprivation.   He was previously taking enzalutamide, but claims this medicine made him feel poorly.  He also feels Zometa with metastatic bone disease, but his apnea treatment is medically causing particular cellulitis.  He comes in today for routine follow-up.  Since his last visit, the patient's been doing okay.  He continues to complain of lower back pain, which has been an issue for years.  Of note, he was given palliative radiation last year to treat this as a focus of prostate cancer was seen in this region.  He denies having any other new symptoms or findings which concern him for overt progression of his metastatic prostate cancer.  PHYSICAL EXAM:  Blood pressure (!) 191/88, pulse 75, temperature 97.6 F (36.4 C), resp. rate 18, height '5\' 11"'$  (1.803 m), weight 223 lb 8 oz (101.4 kg), SpO2 99 %. Wt Readings from Last 3 Encounters:  07/04/22 223 lb 8 oz (101.4 kg)  04/06/22 219 lb (99.3 kg)  04/03/22 217 lb 3.2 oz (98.5 kg)   Body mass index is 31.17 kg/m. Performance status (ECOG): 1 Physical Exam Constitutional:      General: He is not in acute distress.    Appearance: Normal appearance. He is normal weight.  HENT:     Head: Normocephalic and atraumatic.  Eyes:     General: No scleral icterus.    Extraocular Movements: Extraocular movements intact.     Conjunctiva/sclera: Conjunctivae normal.     Pupils: Pupils are equal, round, and reactive to light.  Cardiovascular:     Rate and Rhythm: Normal rate and regular rhythm.     Pulses: Normal pulses.     Heart sounds: Normal heart sounds.  No murmur heard.    No friction rub. No gallop.  Pulmonary:     Effort: Pulmonary effort is normal. No respiratory distress.     Breath sounds: Normal breath sounds.  Abdominal:     General: Bowel sounds are normal. There is no distension.     Palpations: Abdomen is soft. There is no hepatomegaly, splenomegaly or mass.     Tenderness: There is no abdominal tenderness.  Musculoskeletal:        General: Normal range of motion.     Cervical back: Normal range of motion and neck supple.     Right lower leg: No edema.     Left lower leg: No edema.  Lymphadenopathy:     Cervical: No cervical adenopathy.  Skin:    General: Skin is warm and dry.  Neurological:     General: No focal deficit present.     Mental Status: He is alert and oriented to person, place, and time. Mental status is at baseline.  Psychiatric:        Mood and Affect: Mood normal.        Behavior: Behavior normal.        Thought Content: Thought content normal.        Judgment: Judgment normal.   LABS:     Latest Reference Range & Units 07/02/22 10:43  Prostatic Specific Antigen 0.00 - 4.00 ng/mL 0.77  Latest Reference Range & Units 08/17/21 10:40 11/15/21 09:41 04/03/22 13:27  Prostatic Specific Antigen 0.00 - 4.00 ng/mL 0.25 0.25 0.45    ASSESSMENT & PLAN:  Assessment/Plan:  A 71 y.o. male with metastatic prostate cancer.  When evaluating this gentleman's most recent labs, his PSA has risen again.  Nevertheless, it remains below 1.  For now, he will continue to receive Lupron injections every 3 months.  I will continue to follow his PSA level every 3 months.  The patient understands that if his PSA ever rises above 1, repeat imaging would be done to ensure he has no radiographic evidence of disease progression.  I would also consider adding a different antiandrogen agent onto his Lupron for complete androgen blockade, such as apalutamide.  With respect to his back pain, his MSIR prescription will be refilled today.   I will see him back in 3 months for repeat clinical assessment. The patient understands all the plans discussed today and is in agreement with them.   Veleda Mun Macarthur Critchley, MD

## 2022-07-04 ENCOUNTER — Other Ambulatory Visit: Payer: Self-pay | Admitting: Oncology

## 2022-07-04 ENCOUNTER — Inpatient Hospital Stay: Payer: Medicare Other | Admitting: Oncology

## 2022-07-04 ENCOUNTER — Ambulatory Visit: Payer: Medicare Other

## 2022-07-04 VITALS — BP 191/88 | HR 75 | Temp 97.6°F | Resp 18 | Ht 71.0 in | Wt 223.5 lb

## 2022-07-04 DIAGNOSIS — C61 Malignant neoplasm of prostate: Secondary | ICD-10-CM

## 2022-07-04 DIAGNOSIS — C7952 Secondary malignant neoplasm of bone marrow: Secondary | ICD-10-CM

## 2022-07-04 DIAGNOSIS — C7951 Secondary malignant neoplasm of bone: Secondary | ICD-10-CM

## 2022-07-04 MED ORDER — MORPHINE SULFATE 15 MG PO TABS: 15.0000 mg | ORAL_TABLET | Freq: Four times a day (QID) | ORAL | 0 refills | Status: DC | PRN

## 2022-07-05 ENCOUNTER — Encounter: Payer: Self-pay | Admitting: Oncology

## 2022-07-06 ENCOUNTER — Inpatient Hospital Stay: Payer: Medicare Other | Attending: Oncology

## 2022-07-06 VITALS — BP 145/64 | HR 82 | Temp 97.8°F | Resp 18 | Ht 71.0 in | Wt 227.0 lb

## 2022-07-06 DIAGNOSIS — C7951 Secondary malignant neoplasm of bone: Secondary | ICD-10-CM | POA: Diagnosis not present

## 2022-07-06 DIAGNOSIS — Z79899 Other long term (current) drug therapy: Secondary | ICD-10-CM | POA: Diagnosis not present

## 2022-07-06 DIAGNOSIS — Z5111 Encounter for antineoplastic chemotherapy: Secondary | ICD-10-CM | POA: Insufficient documentation

## 2022-07-06 DIAGNOSIS — C61 Malignant neoplasm of prostate: Secondary | ICD-10-CM | POA: Insufficient documentation

## 2022-07-06 MED ORDER — LEUPROLIDE ACETATE (3 MONTH) 22.5 MG IM KIT
22.5000 mg | PACK | Freq: Once | INTRAMUSCULAR | Status: AC
  Administered 2022-07-06: 22.5 mg via INTRAMUSCULAR
  Filled 2022-07-06: qty 22.5

## 2022-07-06 MED ORDER — METHYLPREDNISOLONE SODIUM SUCC 125 MG IJ SOLR: 125.0000 mg | Freq: Once | INTRAMUSCULAR | Status: DC | PRN

## 2022-07-06 MED ORDER — ALBUTEROL SULFATE (2.5 MG/3ML) 0.083% IN NEBU: 2.5000 mg | INHALATION_SOLUTION | Freq: Once | RESPIRATORY_TRACT | Status: DC | PRN

## 2022-07-06 MED ORDER — DENOSUMAB 120 MG/1.7ML ~~LOC~~ SOLN
120.0000 mg | Freq: Once | SUBCUTANEOUS | Status: AC
  Administered 2022-07-06: 120 mg via SUBCUTANEOUS
  Filled 2022-07-06: qty 1.7

## 2022-07-06 MED ORDER — SODIUM CHLORIDE 0.9 % IV SOLN: Freq: Once | INTRAVENOUS | Status: DC | PRN

## 2022-07-06 MED ORDER — DIPHENHYDRAMINE HCL 50 MG/ML IJ SOLN: 50.0000 mg | Freq: Once | INTRAMUSCULAR | Status: DC | PRN

## 2022-07-06 MED ORDER — EPINEPHRINE 0.3 MG/0.3ML IJ SOAJ: 0.3000 mg | Freq: Once | INTRAMUSCULAR | Status: DC | PRN

## 2022-07-06 MED ORDER — DIPHENHYDRAMINE HCL 50 MG/ML IJ SOLN: 25.0000 mg | Freq: Once | INTRAMUSCULAR | Status: DC | PRN

## 2022-07-06 NOTE — Patient Instructions (Signed)
TAKE CALCIUM '600MG'$  PILLS. TAKE 2 PILLS DAILY WHILE ON XGEVA (DENOSUMAB) FOR A TOTAL OF '1200MG'$  DAILY.  Leuprolide Solution for Injection What is this medication? LEUPROLIDE (loo PROE lide) reduces the symptoms of prostate cancer. It works by decreasing levels of the hormone testosterone in the body. This prevents prostate cancer cells from spreading or growing. This medicine may be used for other purposes; ask your health care provider or pharmacist if you have questions. COMMON BRAND NAME(S): Lupron What should I tell my care team before I take this medication? They need to know if you have any of these conditions: Diabetes Heart attack Heart disease High blood pressure High cholesterol Pain or difficulty passing urine Spinal cord metastasis Stroke Tobacco use An unusual or allergic reaction to leuprolide, other medications, foods, dyes, or preservatives Pregnant or trying to get pregnant Breast-feeding How should I use this medication? This medication is for injection under the skin or into a muscle. You will be taught how to prepare and give this medication. Use exactly as directed. Take your medication at regular intervals. Do not take it more often than directed. It is important that you put your used needles and syringes in a special sharps container. Do not put them in a trash can. If you do not have a sharps container, call your care team to get one. A special MedGuide will be given to you by the pharmacist with each prescription and refill. Be sure to read this information carefully each time. Talk to your care team about the use of this medication in children. While this medication may be prescribed for children as young as 8 years for selected conditions, precautions do apply. Overdosage: If you think you have taken too much of this medicine contact a poison control center or emergency room at once. NOTE: This medicine is only for you. Do not share this medicine with  others. What if I miss a dose? If you miss a dose, take it as soon as you can. If it is almost time for your next dose, take only that dose. Do not take double or extra doses. What may interact with this medication? Do not take this medication with any of the following: Chasteberry Cisapride Dronedarone Pimozide Thioridazine This medication may also interact with the following: Estrogen or progestin hormones Herbal or dietary supplements, like black cohosh or DHEA Other medications that cause heart rhythm changes Testosterone This list may not describe all possible interactions. Give your health care provider a list of all the medicines, herbs, non-prescription drugs, or dietary supplements you use. Also tell them if you smoke, drink alcohol, or use illegal drugs. Some items may interact with your medicine. What should I watch for while using this medication? Visit your care team for regular checks on your progress. During the first week, your symptoms may get worse, but then will improve as you continue your treatment. You may get hot flashes, increased bone pain, increased difficulty passing urine, or an aggravation of nerve symptoms. Discuss these effects with your care team, some of them may improve with continued use of this medication. Patients may experience a menstrual cycle or spotting during the first 2 months of therapy with this medication. If this continues, contact your care team. This medication may increase blood sugar. The risk may be higher in patients who already have diabetes. Ask your care team what you can do to lower your risk of diabetes while taking this medication. What side effects may I notice from receiving  this medication? Side effects that you should report to your care team as soon as possible: Allergic reactions--skin rash, itching, hives, swelling of the face, lips, tongue, or throat Heart attack--pain or tightness in the chest, shoulders, arms, or jaw,  nausea, shortness of breath, cold or clammy skin, feeling faint or lightheaded Heart rhythm changes--fast or irregular heartbeat, dizziness, feeling faint or lightheaded, chest pain, trouble breathing High blood sugar (hyperglycemia)--increased thirst or amount of urine, unusual weakness or fatigue, blurry vision Mood swings, irritability, hostility Seizures Stroke--sudden numbness or weakness of the face, arm, or leg, trouble speaking, confusion, trouble walking, loss of balance or coordination, dizziness, severe headache, change in vision Thoughts of suicide or self-harm, worsening mood, feelings of depression Side effects that usually do not require medical attention (report to your care team if they continue or are bothersome): Bone pain Change in sex drive or performance General discomfort and fatigue Hot flashes Muscle pain Pain, redness, or irritation at injection site Swelling of the ankles, hands, or feet This list may not describe all possible side effects. Call your doctor for medical advice about side effects. You may report side effects to FDA at 1-800-FDA-1088. Where should I keep my medication? Keep out of the reach of children and pets. Store below 25 degrees C (77 degrees F). Do not freeze. Protect from light. Get rid of any unused medication after the expiration date. To get rid of medications that are no longer needed or have expired: Take the medication to a medication take-back program. Check with your pharmacy or law enforcement to find a location. If you cannot return the medication, ask your pharmacist or care team how to get rid of this medication safely. NOTE: This sheet is a summary. It may not cover all possible information. If you have questions about this medicine, talk to your doctor, pharmacist, or health care provider.  2023 Elsevier/Gold Standard (2021-06-23 00:00:00)  Denosumab Injection (Oncology) What is this medication? DENOSUMAB (den oh SUE mab)  prevents weakened bones caused by cancer. It may also be used to treat noncancerous bone tumors that cannot be removed by surgery. It can also be used to treat high calcium levels in the blood caused by cancer. It works by blocking a protein that causes bones to break down quickly. This slows down the release of calcium from bones, which lowers calcium levels in your blood. It also makes your bones stronger and less likely to break (fracture). This medicine may be used for other purposes; ask your health care provider or pharmacist if you have questions. COMMON BRAND NAME(S): XGEVA What should I tell my care team before I take this medication? They need to know if you have any of these conditions: Dental disease Having surgery or tooth extraction Infection Kidney disease Low levels of calcium or vitamin D in the blood Malnutrition On hemodialysis Skin conditions or sensitivity Thyroid or parathyroid disease An unusual reaction to denosumab, other medications, foods, dyes, or preservatives Pregnant or trying to get pregnant Breast-feeding How should I use this medication? This medication is for injection under the skin. It is given by your care team in a hospital or clinic setting. A special MedGuide will be given to you before each treatment. Be sure to read this information carefully each time. Talk to your care team about the use of this medication in children. While it may be prescribed for children as young as 13 years for selected conditions, precautions do apply. Overdosage: If you think you have taken  too much of this medicine contact a poison control center or emergency room at once. NOTE: This medicine is only for you. Do not share this medicine with others. What if I miss a dose? Keep appointments for follow-up doses. It is important not to miss your dose. Call your care team if you are unable to keep an appointment. What may interact with this medication? Do not take this  medication with any of the following: Other medications containing denosumab This medication may also interact with the following: Medications that lower your chance of fighting infection Steroid medications, such as prednisone or cortisone This list may not describe all possible interactions. Give your health care provider a list of all the medicines, herbs, non-prescription drugs, or dietary supplements you use. Also tell them if you smoke, drink alcohol, or use illegal drugs. Some items may interact with your medicine. What should I watch for while using this medication? Your condition will be monitored carefully while you are receiving this medication. You may need blood work while taking this medication. This medication may increase your risk of getting an infection. Call your care team for advice if you get a fever, chills, sore throat, or other symptoms of a cold or flu. Do not treat yourself. Try to avoid being around people who are sick. You should make sure you get enough calcium and vitamin D while you are taking this medication, unless your care team tells you not to. Discuss the foods you eat and the vitamins you take with your care team. Some people who take this medication have severe bone, joint, or muscle pain. This medication may also increase your risk for jaw problems or a broken thigh bone. Tell your care team right away if you have severe pain in your jaw, bones, joints, or muscles. Tell your care team if you have any pain that does not go away or that gets worse. Talk to your care team if you may be pregnant. Serious birth defects can occur if you take this medication during pregnancy and for 5 months after the last dose. You will need a negative pregnancy test before starting this medication. Contraception is recommended while taking this medication and for 5 months after the last dose. Your care team can help you find the option that works for you. What side effects may I notice  from receiving this medication? Side effects that you should report to your care team as soon as possible: Allergic reactions--skin rash, itching, hives, swelling of the face, lips, tongue, or throat Bone, joint, or muscle pain Low calcium level--muscle pain or cramps, confusion, tingling, or numbness in the hands or feet Osteonecrosis of the jaw--pain, swelling, or redness in the mouth, numbness of the jaw, poor healing after dental work, unusual discharge from the mouth, visible bones in the mouth Side effects that usually do not require medical attention (report to your care team if they continue or are bothersome): Cough Diarrhea Fatigue Headache Nausea This list may not describe all possible side effects. Call your doctor for medical advice about side effects. You may report side effects to FDA at 1-800-FDA-1088. Where should I keep my medication? This medication is given in a hospital or clinic. It will not be stored at home. NOTE: This sheet is a summary. It may not cover all possible information. If you have questions about this medicine, talk to your doctor, pharmacist, or health care provider.  2023 Elsevier/Gold Standard (2021-12-11 00:00:00)

## 2022-07-20 ENCOUNTER — Encounter: Payer: Self-pay | Admitting: Oncology

## 2022-08-20 DIAGNOSIS — I2510 Atherosclerotic heart disease of native coronary artery without angina pectoris: Secondary | ICD-10-CM | POA: Diagnosis not present

## 2022-09-30 DIAGNOSIS — F17210 Nicotine dependence, cigarettes, uncomplicated: Secondary | ICD-10-CM | POA: Diagnosis not present

## 2022-09-30 DIAGNOSIS — R0789 Other chest pain: Secondary | ICD-10-CM | POA: Diagnosis not present

## 2022-09-30 DIAGNOSIS — Z96649 Presence of unspecified artificial hip joint: Secondary | ICD-10-CM | POA: Diagnosis not present

## 2022-09-30 DIAGNOSIS — K5730 Diverticulosis of large intestine without perforation or abscess without bleeding: Secondary | ICD-10-CM | POA: Diagnosis not present

## 2022-09-30 DIAGNOSIS — J449 Chronic obstructive pulmonary disease, unspecified: Secondary | ICD-10-CM | POA: Diagnosis not present

## 2022-09-30 DIAGNOSIS — R1012 Left upper quadrant pain: Secondary | ICD-10-CM | POA: Diagnosis not present

## 2022-09-30 DIAGNOSIS — Z8673 Personal history of transient ischemic attack (TIA), and cerebral infarction without residual deficits: Secondary | ICD-10-CM | POA: Diagnosis not present

## 2022-09-30 DIAGNOSIS — I252 Old myocardial infarction: Secondary | ICD-10-CM | POA: Diagnosis not present

## 2022-09-30 DIAGNOSIS — K760 Fatty (change of) liver, not elsewhere classified: Secondary | ICD-10-CM | POA: Diagnosis not present

## 2022-09-30 DIAGNOSIS — R079 Chest pain, unspecified: Secondary | ICD-10-CM | POA: Diagnosis not present

## 2022-09-30 DIAGNOSIS — I509 Heart failure, unspecified: Secondary | ICD-10-CM | POA: Diagnosis not present

## 2022-09-30 DIAGNOSIS — I110 Hypertensive heart disease with heart failure: Secondary | ICD-10-CM | POA: Diagnosis not present

## 2022-09-30 DIAGNOSIS — K5900 Constipation, unspecified: Secondary | ICD-10-CM | POA: Diagnosis not present

## 2022-09-30 DIAGNOSIS — Z951 Presence of aortocoronary bypass graft: Secondary | ICD-10-CM | POA: Diagnosis not present

## 2022-09-30 DIAGNOSIS — Z85830 Personal history of malignant neoplasm of bone: Secondary | ICD-10-CM | POA: Diagnosis not present

## 2022-09-30 DIAGNOSIS — R109 Unspecified abdominal pain: Secondary | ICD-10-CM | POA: Diagnosis not present

## 2022-10-03 ENCOUNTER — Inpatient Hospital Stay: Payer: Medicare Other | Attending: Oncology

## 2022-10-03 DIAGNOSIS — C61 Malignant neoplasm of prostate: Secondary | ICD-10-CM | POA: Diagnosis present

## 2022-10-03 LAB — PSA: Prostatic Specific Antigen: 1.08 ng/mL (ref 0.00–4.00)

## 2022-10-03 NOTE — Progress Notes (Unsigned)
[facility]  [address] [place],    [address] [phone]  Clinic Day:  10/04/2022  Referring physician: [name], MD   HISTORY OF PRESENT ILLNESS:  The patient is a 72 y.o. male with metastatic prostate cancer, which includes spread of disease to his bones and retroperitoneal lymph nodes.  He currently takes only Lupron for his androgen deprivation.   He was previously taking enzalutamide, but claims this medicine made him feel poorly.  He also takes Zometa for his metastatic bone disease. He comes in today for routine follow-up.  Since his last visit, the patient's been doing okay.  He continues to complain of lower back pain, which has been an issue for years.  He also complains of anterior left rib pain, which is new.  He is concerned this area of discomfort is related to his metastatic prostate cancer.  PHYSICAL EXAM:  Blood pressure (!) 188/87, pulse 78, temperature 97.6 F (36.4 C), resp. rate 20, height '5\' 11"'$  (1.803 m), weight 227 lb 4.8 oz (103.1 kg), SpO2 98 %. Wt Readings from Last 3 Encounters:  10/04/22 227 lb 4.8 oz (103.1 kg)  07/06/22 227 lb (103 kg)  07/04/22 223 lb 8 oz (101.4 kg)   Body mass index is 31.7 kg/m. Performance status (ECOG): 1 Physical Exam Constitutional:      General: He is not in acute distress.    Appearance: Normal appearance. He is normal weight.  HENT:     Head: Normocephalic and atraumatic.  Eyes:     General: No scleral icterus.    Extraocular Movements: Extraocular movements intact.     Conjunctiva/sclera: Conjunctivae normal.     Pupils: Pupils are equal, round, and reactive to light.  Cardiovascular:     Rate and Rhythm: Normal rate and regular rhythm.     Pulses: Normal pulses.     Heart sounds: Normal heart sounds. No murmur heard.    No friction rub. No gallop.  Pulmonary:     Effort: Pulmonary effort is normal. No respiratory distress.     Breath sounds: Normal breath sounds.   Abdominal:     General: Bowel sounds are normal. There is no distension.     Palpations: Abdomen is soft. There is no hepatomegaly, splenomegaly or mass.     Tenderness: There is no abdominal tenderness.  Musculoskeletal:        General: Normal range of motion.     Cervical back: Normal range of motion and neck supple.     Right lower leg: No edema.     Left lower leg: No edema.  Lymphadenopathy:     Cervical: No cervical adenopathy.  Skin:    General: Skin is warm and dry.  Neurological:     General: No focal deficit present.     Mental Status: He is alert and oriented to person, place, and time. Mental status is at baseline.  Psychiatric:        Mood and Affect: Mood normal.        Behavior: Behavior normal.        Thought Content: Thought content normal.        Judgment: Judgment normal.   LABS:    Latest Reference Range & Units 07/02/22 10:43 10/03/22 08:34  Prostatic Specific Antigen 0.00 - 4.00 ng/mL 0.77 1.08   ASSESSMENT & PLAN:  Assessment/Plan:  A 72 y.o. male with metastatic prostate cancer.  When evaluating this gentleman's most recent labs, his PSA  has risen again and is now above 1.  Based upon this, I will now add apalutamide 240 mg QD to his Lupron injections, which are being given every 3 months.  This will now put him back on complete androgen blockade.  He will continue to take Zometa every 3 months for his metastatic bone disease.  As he feels his MSIR is not working well for pain relief, I will switch him to oxyIR 15 mg Q6h prn pain.  I will see him back in 6 weeks to see how well he is responding to his complete androgen blockade regiment.  The patient understands all the plans discussed today and is in agreement with them.   Eriberto Felch Macarthur Critchley, MD

## 2022-10-04 ENCOUNTER — Other Ambulatory Visit (HOSPITAL_COMMUNITY): Payer: Self-pay

## 2022-10-04 ENCOUNTER — Telehealth: Payer: Self-pay

## 2022-10-04 ENCOUNTER — Telehealth: Payer: Self-pay | Admitting: Pharmacy Technician

## 2022-10-04 ENCOUNTER — Encounter: Payer: Self-pay | Admitting: Oncology

## 2022-10-04 ENCOUNTER — Inpatient Hospital Stay: Payer: Medicare Other | Admitting: Oncology

## 2022-10-04 ENCOUNTER — Other Ambulatory Visit: Payer: Self-pay | Admitting: Oncology

## 2022-10-04 VITALS — BP 188/87 | HR 78 | Temp 97.6°F | Resp 20 | Ht 71.0 in | Wt 227.3 lb

## 2022-10-04 DIAGNOSIS — C61 Malignant neoplasm of prostate: Secondary | ICD-10-CM

## 2022-10-04 MED ORDER — OXYCODONE HCL 15 MG PO TABS: 15.0000 mg | ORAL_TABLET | Freq: Four times a day (QID) | ORAL | 0 refills | Status: DC | PRN

## 2022-10-04 MED ORDER — APALUTAMIDE 60 MG PO TABS
240.0000 mg | ORAL_TABLET | Freq: Every day | ORAL | 3 refills | Status: DC
  Filled 2022-10-04: qty 120, 30d supply, fill #0

## 2022-10-04 NOTE — Telephone Encounter (Addendum)
Oral Oncology Pharmacist Encounter  Received new prescription for apalutamide [facility]) for the treatment of metastatic castration resistant prostate cancer in conjunction with Lupron and xgeva, planned duration until disease progression or unacceptable toxicity.  Labs from 09/30/2022 assessed, no interventions needed. Patient does have elevated creatinine although no dose adjustments are needed. Prescription dose and frequency assessed for appropriateness.   Current medication list in Epic reviewed, DDIs with Erleada identified: - clopidogrel (cat D): according to medication dispense history- patient is not currently on medication. Last filled on 01/14/20 - oxycodone (cat C): Erleada may decrease concentration of oxycodone. Will notify patient.  - zolpidem (cat C): patient has not filled medication since 12/14/2020 per medication dispense history.  - atorvastatin and losartan are not listed in his medication dispense history (from 10/05/19 to current)  Evaluated chart and no patient barriers to medication adherence noted.   Patient agreement for treatment documented in MD note on 10/04/2022.  Prescription has been e-scribed to the [facility] for benefits analysis and approval.  Oral Oncology Clinic will continue to follow for insurance authorization, copayment issues, initial counseling and start date.  Drema Halon, PharmD Hematology/Oncology Clinical Pharmacist [facility] [phone] 10/04/2022 9:58 AM

## 2022-10-04 NOTE — Telephone Encounter (Signed)
Oral Oncology Patient Advocate Encounter   Received notification that prior authorization for [name] is required.   PA submitted on 10/04/22 Key [id number] Status is pending     [name], CPhT-Adv Oncology Pharmacy Patient [place] Direct Number: [phone]  Fax: [phone]

## 2022-10-05 ENCOUNTER — Telehealth: Payer: Self-pay | Admitting: Pharmacy Technician

## 2022-10-05 ENCOUNTER — Inpatient Hospital Stay: Payer: Medicare Other | Attending: Oncology

## 2022-10-05 ENCOUNTER — Other Ambulatory Visit (HOSPITAL_COMMUNITY): Payer: Self-pay

## 2022-10-05 ENCOUNTER — Inpatient Hospital Stay: Payer: Medicare Other

## 2022-10-05 VITALS — BP 180/78 | HR 74 | Temp 97.5°F | Resp 18 | Ht 71.0 in | Wt 227.8 lb

## 2022-10-05 DIAGNOSIS — Z79899 Other long term (current) drug therapy: Secondary | ICD-10-CM | POA: Insufficient documentation

## 2022-10-05 DIAGNOSIS — Z5111 Encounter for antineoplastic chemotherapy: Secondary | ICD-10-CM | POA: Diagnosis not present

## 2022-10-05 DIAGNOSIS — C61 Malignant neoplasm of prostate: Secondary | ICD-10-CM | POA: Insufficient documentation

## 2022-10-05 MED ORDER — LEUPROLIDE ACETATE (3 MONTH) 22.5 MG IM KIT
22.5000 mg | PACK | Freq: Once | INTRAMUSCULAR | Status: AC
  Administered 2022-10-05: 22.5 mg via INTRAMUSCULAR
  Filled 2022-10-05: qty 22.5

## 2022-10-05 NOTE — Telephone Encounter (Signed)
Oral Oncology Patient Advocate Encounter  Prior Authorization for [facility] has been approved.    PA# [id number] Effective dates: 10/05/22 through 08/06/23  Patients co-pay is $3,321.23.    Lady Deutscher, CPhT-Adv Oncology Pharmacy Patient [place] Direct Number: [phone]  Fax: [phone]

## 2022-10-05 NOTE — Patient Instructions (Signed)
Leuprolide Suspension for Injection (Prostate Cancer) What is this medication? LEUPROLIDE (loo PROE lide) reduces the symptoms of prostate cancer. It works by decreasing levels of the hormone testosterone in the body. This prevents prostate cancer cells from spreading or growing. This medicine may be used for other purposes; ask your health care provider or pharmacist if you have questions. COMMON BRAND NAME(S): Eligard, Lupron Depot, Lupron Depot-Ped, Lutrate Depot, Viadur What should I tell my care team before I take this medication? They need to know if you have any of these conditions: Diabetes Heart disease Heart failure High or low levels of electrolytes, such as magnesium, potassium, or sodium in your blood Irregular heartbeat or rhythm Seizures An unusual or allergic reaction to leuprolide, other medications, foods, dyes, or preservatives Pregnant or trying to get pregnant Breast-feeding How should I use this medication? This medication is injected under the skin or into a muscle. It is given by your care team in a hospital or clinic setting. Talk to your care team about the use of this medication in children. Special care may be needed. Overdosage: If you think you have taken too much of this medicine contact a poison control center or emergency room at once. NOTE: This medicine is only for you. Do not share this medicine with others. What if I miss a dose? Keep appointments for follow-up doses. It is important not to miss your dose. Call your care team if you are unable to keep an appointment. What may interact with this medication? Do not take this medication with any of the following: Cisapride Dronedarone Ketoconazole Levoketoconazole Pimozide Thioridazine This medication may also interact with the following: Other medications that cause heart rhythm changes This list may not describe all possible interactions. Give your health care provider a list of all the medicines,  herbs, non-prescription drugs, or dietary supplements you use. Also tell them if you smoke, drink alcohol, or use illegal drugs. Some items may interact with your medicine. What should I watch for while using this medication? Visit your care team for regular checks on your progress. Tell your care team if your symptoms do not start to get better or if they get worse. This medication may increase blood sugar. The risk may be higher in patients who already have diabetes. Ask your care team what you can do to lower the risk of diabetes while taking this medication. This medication may cause infertility. Talk to your care team if you are concerned about your fertility. Heart attacks and strokes have been reported with the use of this medication. Get emergency help if you develop signs or symptoms of a heart attack or stroke. Talk to your care team about the risks and benefits of this medication. What side effects may I notice from receiving this medication? Side effects that you should report to your care team as soon as possible: Allergic reactions--skin rash, itching, hives, swelling of the face, lips, tongue, or throat Heart attack--pain or tightness in the chest, shoulders, arms, or jaw, nausea, shortness of breath, cold or clammy skin, feeling faint or lightheaded Heart rhythm changes--fast or irregular heartbeat, dizziness, feeling faint or lightheaded, chest pain, trouble breathing High blood sugar (hyperglycemia)--increased thirst or amount of urine, unusual weakness or fatigue, blurry vision Mood swings, irritability, hostility Seizures Stroke--sudden numbness or weakness of the face, arm, or leg, trouble speaking, confusion, trouble walking, loss of balance or coordination, dizziness, severe headache, change in vision Thoughts of suicide or self-harm, worsening mood, feelings of depression Side  effects that usually do not require medical attention (report to your care team if they continue or  are bothersome): Bone pain Change in sex drive or performance General discomfort and fatigue Hot flashes Muscle pain Pain, redness, or irritation at injection site Swelling of the ankles, hands, or feet This list may not describe all possible side effects. Call your doctor for medical advice about side effects. You may report side effects to FDA at 1-800-FDA-1088. Where should I keep my medication? This medication is given in a hospital or clinic. It will not be stored at home. NOTE: This sheet is a summary. It may not cover all possible information. If you have questions about this medicine, talk to your doctor, pharmacist, or health care provider.  2023 Elsevier/Gold Standard (2021-10-02 00:00:00)

## 2022-10-05 NOTE — Telephone Encounter (Signed)
Oral Oncology Patient Advocate Encounter   Began application for assistance for Erleada through [employer].   Application will be submitted upon completion of necessary supporting documentation.   [facility] phone number [phone].   I will continue to check the status until final determination.   Lady Deutscher, CPhT-Adv Oncology Pharmacy Patient [place] Direct Number: [phone]  Fax: [phone]

## 2022-10-05 NOTE — Telephone Encounter (Signed)
Oral Oncology Patient Advocate Encounter   Submitted application for assistance for Erleada to [employer].   Application submitted via e-fax to [phone]   [facility] phone number [phone].   I will continue to check the status until final determination.   Lady Deutscher, CPhT-Adv Oncology Pharmacy Patient [place] Direct Number: [phone]  Fax: [phone]

## 2022-10-08 NOTE — Telephone Encounter (Signed)
Oral Oncology Patient Advocate Encounter  Received call from [employer] stating they had spoken with the patient and confirmed that no income tax filing information is available. The program requested a letter from our office confirming this information and that the patient would not be filing any income taxes this year either.   A signed letter with this information has been submitted via e-fax to the program. E-fax [place], [place] Patient [place] Direct Number: [phone]  Fax: [phone]

## 2022-10-10 ENCOUNTER — Inpatient Hospital Stay (HOSPITAL_BASED_OUTPATIENT_CLINIC_OR_DEPARTMENT_OTHER): Payer: Medicare Other

## 2022-10-10 DIAGNOSIS — C61 Malignant neoplasm of prostate: Secondary | ICD-10-CM

## 2022-10-10 NOTE — Progress Notes (Addendum)
Oral Chemotherapy Pharmacist Encounter  I spoke with patient in clinic for overview of: Erleada (apalutamide) for the treatment of metastatic prostate cancer, planned duration until disease progression or unacceptable toxicity.   Counseled patient on administration, dosing, side effects, monitoring, drug-food interactions, safe handling, storage, and disposal.  Patient will take Erleada 60mg  tablets, 4 tablets (240mg ) by mouth once daily without regard to food.  Erleada start date: pending patient assistance program  Adverse effects include but are not limited to: rash, peripheral edema, GI upset, hypertension, hot flashes, fatigue, and arthralgias.    Patient is experiencing nausea currently. Requested nausea medications be sent in to Dr. Bobby Rumpf.   Reviewed with patient importance of keeping a medication schedule and plan for any missed doses. No barriers to medication adherence identified.  Medication reconciliation performed and medication/allergy list updated.  Insurance authorization for [employer] [place] has been obtained.  This medication will ship from [place] patient assistance program.   Patient informed the pharmacy will reach out 5-7 days prior to needing next fill of Erleada to coordinate continued medication acquisition to prevent break in therapy.  All questions answered.  Patient voiced understanding and appreciation.   Medication education handout placed given to patient in clinic. Patient knows to call the office with questions or concerns. Oral Chemotherapy Clinic phone number provided to patient.   Drema Halon, PharmD Hematology/Oncology Clinical Pharmacist [facility] [phone] 10/10/2022   9:22 AM

## 2022-10-10 NOTE — Telephone Encounter (Signed)
Oral Oncology Patient Advocate Encounter  Called to check status of assistance application for Erleada through [place].  Status is pending [facility].   [employer] phone [phone]  I will continue to check status until final determination.  Lady Deutscher, CPhT-Adv Oncology Pharmacy Patient [place] Direct Number: [phone]  Fax: [phone]

## 2022-10-15 NOTE — Telephone Encounter (Signed)
Oral Oncology Patient Advocate Encounter   Received notification that the application for assistance for Erleada through [employer] has been approved.   [facility] phone number [phone].   Effective dates: 10/14/22 through 08/06/23  I have spoken to the patient.  Lady Deutscher, CPhT-Adv Oncology Pharmacy Patient [place] Direct Number: [phone]  Fax: [phone]

## 2022-10-19 ENCOUNTER — Telehealth: Payer: Self-pay

## 2022-10-19 ENCOUNTER — Other Ambulatory Visit: Payer: Self-pay

## 2022-10-19 MED ORDER — ONDANSETRON HCL 4 MG PO TABS: 4.0000 mg | ORAL_TABLET | ORAL | 1 refills | Status: DC | PRN

## 2022-10-19 NOTE — Telephone Encounter (Signed)
Oral Oncology Pharmacist Encounter  Patients [name] is set  up for shipment with the patient assistance program and will receive medication on Nov 08, 2022.   Patient will start medication on Monday 2022/11/08).   Patient has follow up visit with Dr. Bobby Rumpf on 11/15/22 with labs.   Drema Halon, PharmD Hematology/Oncology Clinical Pharmacist [facility] [phone]

## 2022-11-05 DEATH — deceased

## 2022-11-14 ENCOUNTER — Other Ambulatory Visit: Payer: Medicare Other

## 2022-11-15 ENCOUNTER — Ambulatory Visit: Payer: Medicare Other | Admitting: Oncology
